# Patient Record
Sex: Male | Born: 1980 | Race: White | Hispanic: No | Smoking: Never smoker
Health system: Southern US, Community
[De-identification: ages and names within clinical notes are randomized; demographics above are authoritative.]

## PROBLEM LIST (undated history)

## (undated) DIAGNOSIS — G4730 Sleep apnea, unspecified: Secondary | ICD-10-CM

## (undated) DIAGNOSIS — F419 Anxiety disorder, unspecified: Secondary | ICD-10-CM

## (undated) DIAGNOSIS — F429 Obsessive-compulsive disorder, unspecified: Secondary | ICD-10-CM

## (undated) DIAGNOSIS — F329 Major depressive disorder, single episode, unspecified: Secondary | ICD-10-CM

## (undated) DIAGNOSIS — F32A Depression, unspecified: Secondary | ICD-10-CM

## (undated) HISTORY — PX: KNEE SURGERY: SHX244

## (undated) HISTORY — DX: Sleep apnea, unspecified: G47.30

## (undated) HISTORY — DX: Anxiety disorder, unspecified: F41.9

---
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
## 1998-11-20 ENCOUNTER — Ambulatory Visit (HOSPITAL_COMMUNITY): Admission: RE | Admit: 1998-11-20 | Discharge: 1998-11-20 | Payer: Self-pay | Admitting: Sports Medicine

## 1998-11-20 ENCOUNTER — Encounter: Payer: Self-pay | Admitting: Sports Medicine

## 1998-12-30 ENCOUNTER — Emergency Department (HOSPITAL_COMMUNITY): Admission: EM | Admit: 1998-12-30 | Discharge: 1998-12-30 | Payer: Self-pay | Admitting: Emergency Medicine

## 2011-07-28 ENCOUNTER — Ambulatory Visit (INDEPENDENT_AMBULATORY_CARE_PROVIDER_SITE_OTHER): Payer: BC Managed Care – PPO | Admitting: Physician Assistant

## 2011-07-28 VITALS — BP 146/81 | HR 96 | Temp 98.3°F | Resp 18 | Ht 68.0 in | Wt 270.0 lb

## 2011-07-28 DIAGNOSIS — Z111 Encounter for screening for respiratory tuberculosis: Secondary | ICD-10-CM

## 2011-07-28 NOTE — Progress Notes (Deleted)
  Subjective:    Patient ID: Todd Tucker, male    DOB: 1980-05-18, 31 y.o.   MRN: [id number]  HPI    Review of Systems     Objective:   Physical Exam        Assessment & Plan:

## 2011-07-28 NOTE — Progress Notes (Signed)
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  TB quesitonaire reviewed.  Ok to place PPD

## 2011-07-31 ENCOUNTER — Ambulatory Visit (INDEPENDENT_AMBULATORY_CARE_PROVIDER_SITE_OTHER): Payer: BC Managed Care – PPO

## 2011-07-31 DIAGNOSIS — Z111 Encounter for screening for respiratory tuberculosis: Secondary | ICD-10-CM

## 2011-07-31 LAB — TB SKIN TEST
Induration: 0 mm
TB Skin Test: NEGATIVE

## 2013-12-02 ENCOUNTER — Ambulatory Visit (HOSPITAL_COMMUNITY): Payer: Self-pay | Admitting: Psychiatry

## 2016-11-26 ENCOUNTER — Emergency Department (HOSPITAL_COMMUNITY)
Admission: EM | Admit: 2016-11-26 | Discharge: 2016-11-26 | Disposition: A | Discharge status: Home / Self Care | Payer: BC Managed Care – PPO | Attending: Emergency Medicine | Admitting: Emergency Medicine

## 2016-11-26 ENCOUNTER — Emergency Department (HOSPITAL_COMMUNITY): Payer: BC Managed Care – PPO

## 2016-11-26 ENCOUNTER — Encounter (HOSPITAL_COMMUNITY): Payer: Self-pay | Admitting: *Deleted

## 2016-11-26 DIAGNOSIS — Y929 Unspecified place or not applicable: Secondary | ICD-10-CM | POA: Insufficient documentation

## 2016-11-26 DIAGNOSIS — Y939 Activity, unspecified: Secondary | ICD-10-CM | POA: Diagnosis not present

## 2016-11-26 DIAGNOSIS — Y999 Unspecified external cause status: Secondary | ICD-10-CM | POA: Insufficient documentation

## 2016-11-26 DIAGNOSIS — S62306A Unspecified fracture of fifth metacarpal bone, right hand, initial encounter for closed fracture: Secondary | ICD-10-CM | POA: Insufficient documentation

## 2016-11-26 DIAGNOSIS — S62339A Displaced fracture of neck of unspecified metacarpal bone, initial encounter for closed fracture: Secondary | ICD-10-CM

## 2016-11-26 DIAGNOSIS — W2209XA Striking against other stationary object, initial encounter: Secondary | ICD-10-CM | POA: Insufficient documentation

## 2016-11-26 HISTORY — DX: Depression, unspecified: F32.A

## 2016-11-26 HISTORY — DX: Major depressive disorder, single episode, unspecified: F32.9

## 2016-11-26 HISTORY — DX: Obsessive-compulsive disorder, unspecified: F42.9

## 2016-11-26 NOTE — Discharge Instructions (Signed)
Please see Dr. Izora Ribas or another hand doctor within one week for follow-up and further evaluation and treatment. Keep your splint dry and keep it on until you see a hand doctor. Please return to emergency department if you develop any new or worsening symptoms including numbness of your fingers, if your cast gets wet, or any other concerning symptoms.

## 2016-11-26 NOTE — ED Provider Notes (Signed)
[facility]-EMERGENCY DEPT Provider Note   CSN: [id number] Arrival date & time: 11/26/16  1424     History   Chief Complaint Chief Complaint  Patient presents with  . Hand Pain    right    HPI Todd Tucker is a 36 y.o. male with history of bipolar 1 disorder, depression, OCD who presents with right hand pain after punching a dry erase board. 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 He had initial tingling, but that has resolved today. He has taken Aleve at home and used ice without relief. He has had associated swelling of his hand. He denies any other symptoms or injuries.  HPI  Past Medical History:  Diagnosis Date  . Bipolar 1 disorder (HCC)   . Depression   . OCD (obsessive compulsive disorder)     There are no active problems to display for this patient.   Past Surgical History:  Procedure Laterality Date  . KNEE SURGERY Bilateral        Home Medications    Prior to Admission medications   Not on File    Family History No family history on file.  Social History Social History  Substance Use Topics  . Smoking status: Never Smoker  . Smokeless tobacco: Never Used  . Alcohol use Yes     Comment: rarely     Allergies   Patient has no known allergies.   Review of Systems Review of Systems  Constitutional: Negative for chills and fever.  HENT: Negative for facial swelling and sore throat.   Respiratory: Negative for shortness of breath.   Cardiovascular: Negative for chest pain.  Gastrointestinal: Negative for abdominal pain, nausea and vomiting.  Genitourinary: Negative for dysuria.  Musculoskeletal: Positive for arthralgias, joint swelling and myalgias. Negative for back pain.  Skin: Negative for rash and wound.  Neurological: Negative for headaches.  Psychiatric/Behavioral: The patient is not nervous/anxious.       Physical Exam Updated Vital Signs BP (!) 145/98 (BP Location: Left Arm)   Pulse 88   Temp 97.8 F (36.6 C) (Oral)   Resp (!) 22   Ht 5' 6.5" (1.689 m)   Wt 122.5 kg (270 lb)   SpO2 96%   BMI 42.93 kg/m   Physical Exam  Constitutional: He appears well-developed and well-nourished. No distress.  HENT:  Head: Normocephalic and atraumatic.  Mouth/Throat: Oropharynx is clear and moist. No oropharyngeal exudate.  Eyes: Pupils are equal, round, and reactive to light. Conjunctivae are normal. Right eye exhibits no discharge. Left eye exhibits no discharge. No scleral icterus.  Neck: Normal range of motion. Neck supple. No thyromegaly present.  Cardiovascular: Normal rate, regular rhythm, normal heart sounds and intact distal pulses.  Exam reveals no gallop and no friction rub.   No murmur heard. Pulmonary/Chest: Effort normal and breath sounds normal. No stridor. No respiratory distress. He has no wheezes. He has no rales.  Abdominal: Soft. Bowel sounds are normal. He exhibits no distension. There is no tenderness. There is no rebound and no guarding.  Musculoskeletal: He exhibits no edema.  R hand: Swelling noted over the fourth and fifth metacarpals; tenderness associated signs; sensation intact; full range of motion with flexion and extension of fingers, as well as wrist; volar wrist tenderness; radial pulse intact; cap refill <2 secs  Lymphadenopathy:    He has  no cervical adenopathy.  Neurological: He is alert. Coordination normal.  Skin: Skin is warm and dry. No rash noted. He is not diaphoretic. No pallor.  Psychiatric: He has a normal mood and affect.  Nursing note and vitals reviewed.    ED Treatments / Results  Labs (all labs ordered are listed, but only abnormal results are displayed) Labs Reviewed - No data to display  EKG  EKG Interpretation None       Radiology Dg Wrist Complete Right  Result Date: 11/26/2016 CLINICAL DATA:  36 year old male with ulnar  sided hand pain in the region the fifth metacarpal after punching a wall in his apartment. EXAM: RIGHT WRIST - COMPLETE 3+ VIEW COMPARISON:  Concurrently obtained radiographs of the right hand FINDINGS: Positive for an acute fracture through the distal aspect of the fifth metacarpal. There is approximately 40 degrees of palmar tilt of the distal fracture fragment. Significant overlying soft tissue swelling. The carpus is intact incongruent. IMPRESSION: Positive for boxer's fracture of the distal aspect of the fifth metacarpal with approximately 40 degrees of palm are tilt of the distal fracture fragment. Electronically Signed   By: Malachy Moan M.D.   On: 11/26/2016 15:29   Dg Hand Complete Right  Result Date: 11/26/2016 CLINICAL DATA:  Post reduction EXAM: RIGHT HAND - COMPLETE 3+ VIEW COMPARISON:  None. FINDINGS: New overlying splint limits fine bony detail. A boxer's fracture of the fifth metacarpal is again noted with 53 degrees of volar angulation of the fifth metacarpal head as seen on the true lateral view. Previously, the 40 degrees of angulation was measured on the oblique projection. IMPRESSION: Boxer's fracture without significant change as seen through fiberglass. Electronically Signed   By: Tollie Eth M.D.   On: 11/26/2016 17:43   Dg Hand Complete Right  Result Date: 11/26/2016 CLINICAL DATA:  36 year old male with right hand pain after punching the wall EXAM: RIGHT HAND - COMPLETE 3+ VIEW COMPARISON:  Concurrently obtained radiographs of the right wrist FINDINGS: Acute fracture through the distal aspect of the fifth metacarpal. Approximately 40 degrees of palmar tilt of the distal fracture fragment. The remaining bones and joints are intact and unremarkable. There is overlying soft tissue swelling. IMPRESSION: Positive for boxer's fracture of the distal fifth metacarpal with approximately 40 degrees palmar tilt of the distal fracture fragment. Electronically Signed   By: Malachy Moan  M.D.   On: 11/26/2016 15:30    Procedures Procedures (including critical care time)  SPLINT APPLICATION Date/Time: 6:24 PM Authorized by: Emi Holes Consent: Verbal consent obtained. Risks and benefits: risks, benefits and alternatives were discussed Consent given by: patient Splint applied by: orthopedic technician Location details: R hand Splint type: ulnar gutter Supplies used: orthoglass, ACE wrap Post-procedure: The splinted body part was neurovascularly unchanged following the procedure. Patient tolerance: Patient tolerated the procedure well with no immediate complications.   Medications Ordered in ED Medications - No data to display   Initial Impression / Assessment and Plan / ED Course  I have reviewed the triage vital signs and the nursing notes.  Pertinent labs & imaging results that were available during my care of the patient were reviewed by me and considered in my medical decision making (see chart for details).     Patient with boxer's fracture of fifth metacarpal of the right hand. I spoke with Dr. Izora Ribas who advised ulnar gutter and we could attempt reduction. Initially 40 angulation was measured obliquely, reduction without anesthetic with seemingly good dorsal movement  was attempted, however post reduction film shows no improvement and 53 angulation after splinting. Patient does report he was pushing on it more after I left the room prior to splinting. Will have patient follow up with hand surgery. Splint care discussed, as well as elevation. Patient given arm sling. Patient understands and agrees with plan. Patient vitals stable throughout ED course and discharged in satisfactory condition. I also discussed patient case with Dr. Silverio Lay who guided the patient's management and agrees with plan.   Final Clinical Impressions(s) / ED Diagnoses   Final diagnoses:  Closed boxer's fracture, initial encounter    New Prescriptions There are no discharge  medications for this patient.    Emi Holes, PA-C 11/26/16 1824    Charlynne Pander, MD 11/26/16 [phone]

## 2016-11-26 NOTE — ED Triage Notes (Signed)
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  Out of anger, pt punched a dry erase board with right hand.  Pt noticed swelling and thinks his hand is broken.

## 2017-09-20 ENCOUNTER — Encounter (INDEPENDENT_AMBULATORY_CARE_PROVIDER_SITE_OTHER): Payer: Self-pay | Admitting: Ophthalmology

## 2017-09-20 ENCOUNTER — Ambulatory Visit (INDEPENDENT_AMBULATORY_CARE_PROVIDER_SITE_OTHER): Payer: BC Managed Care – PPO | Admitting: Ophthalmology

## 2017-09-20 DIAGNOSIS — H35033 Hypertensive retinopathy, bilateral: Secondary | ICD-10-CM | POA: Diagnosis not present

## 2017-09-20 DIAGNOSIS — H3581 Retinal edema: Secondary | ICD-10-CM | POA: Diagnosis not present

## 2017-09-20 DIAGNOSIS — H539 Unspecified visual disturbance: Secondary | ICD-10-CM

## 2017-09-20 NOTE — Progress Notes (Signed)
[facility] - Clinic Note  09/20/2017     CHIEF COMPLAINT Patient presents for Retina Evaluation   HISTORY OF PRESENT ILLNESS: Todd Tucker is a 37 y.o. male who presents to the clinic today for:   HPI    Retina Evaluation    In left eye.  This started 2 days ago.  Duration of 2 days.  Associated Symptoms Blind Spot and Pain.  Context:  distance vision, mid-range vision and near vision.  Treatments tried include no treatments.  I, the attending physician,  performed the HPI with the patient and updated documentation appropriately.          Comments    37 y/o male pt referred by Dr. Laruth Tucker for concern of unexplained vision loss OS.  Saw Dr. Dione Tucker earlier this morning.  Pt states he began noticing his vision OS gradually deteriorating about 3 years ago, but over the past several weeks, he has had intermittent episodes where his vision OS will go "completely black" for a few seconds, accompanied with major sharp pain in the corners of his left eye.  Symptoms only last for a few seconds, and vision comes back.  This last occurred two days ago.  Nothing in particular seems to trigger symptoms.  Not experiencing any pain currently, but vision OS is very blurred.  Dr. Dione Tucker found nothing abnormal in the patient's left eye.  Denies flashes, floaters.  No gtts.        Last edited by Todd Chris, MD on 09/20/2017 10:39 AM. (History)    Pt states he saw Todd Tucker yesterday due to having pain and gradual decrease VA OS; Pt states he was sent to Dr. Dione Tucker today; Pt states 2 years ago he began to notice a decrease in OS VA; Pt states he take medications for psychiatric issues, states he is treated for bipolar disorder and OCD; Pt states he takes anti seizure medications; Pt states he had to pull over on the way home from work due to stabbing pain OS; Pt states he has had many episodes of the pain and "vision black out" that "last a few minutes"; Pt states episodes have happened twice  in the past week, states OS VA is really dark and then when [place] "comes back its really blurred"; Pt states episodes happen randomly, do not have any pattern; Pt states he was prescribed specs x 5 years ago; Pt states 3 years ago he began having pain and thought it was migraines; Pt states he has been on current medications for years, denies any changes in medication doses or brands; Pt denies taking any blood pressure medications, states that yesterday blood pressure was 126/80;   Referring physician: Olivia Canter, MD [address] [place], [place] [address]  HISTORICAL INFORMATION:   Selected notes from the MEDICAL RECORD NUMBER Referred by Dr. Laruth Tucker for concern of unexplained VA loss OS; LEE-  Ocular Hx-  PMH-     CURRENT MEDICATIONS: No current outpatient medications on file. (Ophthalmic Drugs)   No current facility-administered medications for this visit.  (Ophthalmic Drugs)   Current Outpatient Medications (Other)  Medication Sig  . ALPRAZolam (XANAX) 1 MG tablet   . lamoTRIgine (LAMICTAL) 200 MG tablet Take by mouth.  . OXcarbazepine (TRILEPTAL) 150 MG tablet   . sertraline (ZOLOFT) 100 MG tablet    No current facility-administered medications for this visit.  (Other)      REVIEW OF SYSTEMS: ROS  Positive for: Eyes, Psychiatric   Negative for: Constitutional, Gastrointestinal, Neurological, Skin, Genitourinary, Musculoskeletal, HENT, Endocrine, Cardiovascular, Respiratory, Allergic/Imm, Heme/Lymph   Last edited by Celine Mans, COA on 09/20/2017 10:18 AM. (History)       ALLERGIES No Known Allergies  PAST MEDICAL HISTORY Past Medical History:  Diagnosis Date  . Bipolar 1 disorder (HCC)   . Depression   . OCD (obsessive compulsive disorder)    Past Surgical History:  Procedure Laterality Date  . KNEE SURGERY Bilateral     FAMILY HISTORY History reviewed. No pertinent family history.  SOCIAL HISTORY Social History   Tobacco Use  . Smoking  status: Never Smoker  . Smokeless tobacco: Never Used  Substance Use Topics  . Alcohol use: Yes    Comment: rarely  . Drug use: No         OPHTHALMIC EXAM:  Base Eye Exam    Visual Acuity (Snellen - Linear)      Right Left   Dist [place] 20/25 -2 20/150 -2   Dist ph [place] 20/20 20/100 -2       Tonometry (Tonopen, 10:16 AM)      Right Left   Pressure 16 15  Squeezing       Pupils      Dark Light Shape React APD   Right 6 6 Round No None   Left 6 6 Round No None  Pharm dilated OU       Visual Fields (Counting fingers)      Left Right    Full Full  Pt reports that though he could CF OS, the fingers were blurred and doubled.       Extraocular Movement      Right Left    Full, Ortho Full, Ortho       Neuro/Psych    Oriented x3:  Yes   Mood/Affect:  Normal       Dilation    Both eyes:  1.0% Mydriacyl, 2.5% Phenylephrine @ 10:16 AM        Slit Lamp and Fundus Exam    Slit Lamp Exam      Right Left   Lids/Lashes Dermatochalasis - upper lid, Meibomian gland dysfunction Dermatochalasis - upper lid, Meibomian gland dysfunction   Conjunctiva/Sclera White and quiet White and quiet   Cornea Clear Inferior-nasal focal area of PEE    Anterior Chamber Deep and quiet, no cell or flare Deep and quiet, no cell or flare   Iris Round and dilated Round and dilated   Lens Clear Clear   Vitreous Mild Vitreous syneresis Mild Vitreous syneresis       Fundus Exam      Right Left   Disc Pink and Sharp Pink and Sharp   C/D Ratio 0.1 0.2   Macula Good foveal reflex, Retinal pigment epithelial mottling, No heme or edema Good foveal reflex, Retinal pigment epithelial mottling, No heme or edema   Vessels Mild Vascular attenuation, Copper wiring Copper wiring, AV crossing changes, Vascular attenuation   Periphery Attached Attached        Refraction    Manifest Refraction      Sphere Cylinder Axis Dist VA   Right +0.25 +0.50 080 20/20-2   Left [place] +1.50 090 20/150           IMAGING AND PROCEDURES  Imaging and Procedures for @TODAY @  OCT, Retina - OU - Both Eyes       Right Eye Quality was good. Central Foveal Thickness: 287. Progression  has no prior data. Findings include normal foveal contour, no IRF, no SRF.   Left Eye Quality was good. Central Foveal Thickness: 281. Progression has no prior data. Findings include normal foveal contour, no IRF, no SRF.   Notes *Images captured and stored on drive  Diagnosis / Impression:  NFP, No IRF/SRF  Clinical management:  See below  Abbreviations: NFP - Normal foveal profile. CME - cystoid macular edema. PED - pigment epithelial detachment. IRF - intraretinal fluid. SRF - subretinal fluid. EZ - ellipsoid zone. ERM - epiretinal membrane. ORA - outer retinal atrophy. ORT - outer retinal tubulation. SRHM - subretinal hyper-reflective material         Fluorescein Angiography Optos (Transit OS)       Right Eye Progression has no prior data. Early phase findings include normal observations. Mid/Late phase findings include normal observations.   Left Eye Progression has no prior data. Early phase findings include normal observations. Mid/Late phase findings include normal observations.   Notes *Images captured and stored on drive;   Impression: Normal study OU - no vascular perfusion defects or leakage OU                  ASSESSMENT/PLAN:    ICD-10-CM   1. Visual disturbance of one eye H53.9   2. Retinal edema H35.81 OCT, Retina - OU - Both Eyes    Fluorescein Angiography Optos (Transit OS)  3. Hypertensive retinopathy of both eyes H35.033 Fluorescein Angiography Optos (Transit OS)    1. Decreased vision OS - pt reports decreased vision started 2-3 years ago and has gradually worsened - pt presented to Dr. Conley RollsLe 2 days ago due to acute episode of vision loss associated with pain OS - pt reports 2 episodes in the last week - dilated eye exam completely normal - pt denies history of  amblyopia or any significant prior ocular history - was corrected to 20/50 by Dr. Dione BoozeGroat - here, OCT and FA completely normal -- no retinal or ocular pathology noted to explain symptoms - differential includes undiagnosed amblyopia, ocular migraine variant, vs other - no intervention or treatment indicated at this time - recommend monitoring for now - f/u in 4 wks, sooner prn  2. No retinal edema on exam or OCT  3. Hypertensive retinopathy OU - BP normal -- per pt report last reading was 120s / 80s - discussed importance of tight BP control - monitor   Ophthalmic Meds Ordered this visit:  No orders of the defined types were placed in this encounter.      Return in about 1 month (around 10/21/2017) for F/U decreased VA OS, DFE, OCT.  There are no Patient Instructions on file for this visit.   Explained the diagnoses, plan, and follow up with the patient and they expressed understanding.  Patient expressed understanding of the importance of proper follow up care.   This document serves as a record of services personally performed by Karie ChimeraBrian G. Teralyn Mullins, MD, PhD. It was created on their behalf by Virgilio BellingMeredith Fabian, COA, a certified ophthalmic assistant. The creation of this record is the provider's dictation and/or activities during the visit.  Electronically signed by: Virgilio BellingMeredith Fabian, COA  07.31.19 1:06 PM    Karie ChimeraBrian G. Majed Pellegrin, M.D., Ph.D. Diseases & Surgery of the Retina and Vitreous [facility]   I have reviewed the above documentation for accuracy and completeness, and I agree with the above. Karie ChimeraBrian G. Ahlijah Raia, M.D., Ph.D. 09/20/17 1:06 PM  Abbreviations: M myopia (nearsighted); A astigmatism; H hyperopia (farsighted); P presbyopia; Mrx spectacle prescription;  CTL contact lenses; OD right eye; OS left eye; OU both eyes  XT exotropia; ET esotropia; PEK punctate epithelial keratitis; PEE punctate epithelial erosions; DES dry eye syndrome; MGD meibomian  gland dysfunction; ATs artificial tears; PFAT's preservative free artificial tears; [place] nuclear sclerotic cataract; PSC posterior subcapsular cataract; ERM epi-retinal membrane; PVD posterior vitreous detachment; RD retinal detachment; DM diabetes mellitus; DR diabetic retinopathy; NPDR non-proliferative diabetic retinopathy; PDR proliferative diabetic retinopathy; CSME clinically significant macular edema; DME diabetic macular edema; dbh dot blot hemorrhages; CWS cotton wool spot; POAG primary open angle glaucoma; C/D cup-to-disc ratio; HVF humphrey visual field; GVF goldmann visual field; OCT optical coherence tomography; IOP intraocular pressure; BRVO Branch retinal vein occlusion; CRVO central retinal vein occlusion; CRAO central retinal artery occlusion; BRAO branch retinal artery occlusion; RT retinal tear; SB scleral buckle; PPV pars plana vitrectomy; VH Vitreous hemorrhage; PRP panretinal laser photocoagulation; IVK intravitreal kenalog; VMT vitreomacular traction; MH Macular hole;  NVD neovascularization of the disc; NVE neovascularization elsewhere; AREDS age related eye disease study; ARMD age related macular degeneration; POAG primary open angle glaucoma; EBMD epithelial/anterior basement membrane dystrophy; ACIOL anterior chamber intraocular lens; IOL intraocular lens; PCIOL posterior chamber intraocular lens; Phaco/IOL phacoemulsification with intraocular lens placement; [place] photorefractive keratectomy; LASIK laser assisted in situ keratomileusis; HTN hypertension; DM diabetes mellitus; COPD chronic obstructive pulmonary disease

## 2017-10-24 ENCOUNTER — Encounter (INDEPENDENT_AMBULATORY_CARE_PROVIDER_SITE_OTHER): Payer: BC Managed Care – PPO | Admitting: Ophthalmology

## 2017-11-04 DIAGNOSIS — F3181 Bipolar II disorder: Secondary | ICD-10-CM | POA: Insufficient documentation

## 2017-11-04 DIAGNOSIS — F4310 Post-traumatic stress disorder, unspecified: Secondary | ICD-10-CM

## 2017-11-21 ENCOUNTER — Ambulatory Visit: Payer: BC Managed Care – PPO | Admitting: Psychiatry

## 2017-11-24 ENCOUNTER — Telehealth: Payer: Self-pay | Admitting: Psychiatry

## 2017-11-24 MED ORDER — SERTRALINE HCL 100 MG PO TABS: 150.0000 mg | ORAL_TABLET | Freq: Every day | ORAL | 0 refills | Status: DC

## 2017-11-24 MED ORDER — LAMOTRIGINE 200 MG PO TABS: 200.0000 mg | ORAL_TABLET | Freq: Every day | ORAL | 0 refills | Status: DC

## 2017-11-24 NOTE — Telephone Encounter (Signed)
Will get chart for provider asap

## 2017-11-24 NOTE — Telephone Encounter (Signed)
Pt's wife came in to pay bill and disclosed that Todd Tucker had missed his 10/1 appt. He is not doing well at all. Sits at home and is having suicidal feelings. She is afraid to leave him alone. Made an appt that she will bring him to next 10/18 but needs help in the meantime.

## 2017-11-24 NOTE — Telephone Encounter (Signed)
Called wife who reports that he has "had a really rough month." She reports that he often does not verbalize s/s. She is concerned that he is feeling hopeless. She reports that he has reported difficulty with increased dose of Trileptal and reports that he would like to decrease the dose. She reports that he has been delving into his work.She reports that he has been having suicidal ideation that he reports that it is constant. Reports that he is thinking about death related thoughts. Reports that he has been questioning his purpose and what he is living for.  She reports that he has not verbalized suicidal intent and reports that he is "disturbed" by suicidal thoughts. Discussed that he has verbalized intrusive thoughts of self-harm and suicide in the past and that he has had the thoughts when it is not congruent with how he feels or thinks, and wife reports "that's exactly it."   She reports that he has been having increased headaches.   Plan: Discussed decreasing Trileptal to 450 mg po QHS for at least 5 days and may continue to decrease by 150 mg every 5 days until s/s improve. Sertraline and Trileptal refilled. Offered to work pt in before scheduled apt on 12/08/17, however wife reports that he should be ok to wait until scheduled apt. Encouraged wife to contact office if this changes, he has worsening s/s, or if he experiences suicidal intent and/or plan. Advised her to take him to [facility] if he experiences suicidal intent and/or plan and there are safety concerns, and wife agrees to do so.

## 2017-12-08 ENCOUNTER — Ambulatory Visit (INDEPENDENT_AMBULATORY_CARE_PROVIDER_SITE_OTHER): Payer: BC Managed Care – PPO | Admitting: Psychiatry

## 2017-12-08 ENCOUNTER — Encounter: Payer: Self-pay | Admitting: Psychiatry

## 2017-12-08 DIAGNOSIS — F422 Mixed obsessional thoughts and acts: Secondary | ICD-10-CM | POA: Diagnosis not present

## 2017-12-08 DIAGNOSIS — F4310 Post-traumatic stress disorder, unspecified: Secondary | ICD-10-CM

## 2017-12-08 MED ORDER — LAMOTRIGINE 200 MG PO TABS: 200.0000 mg | ORAL_TABLET | Freq: Every day | ORAL | 0 refills | Status: DC

## 2017-12-08 MED ORDER — SERTRALINE HCL 100 MG PO TABS: 200.0000 mg | ORAL_TABLET | Freq: Every day | ORAL | 0 refills | Status: DC

## 2017-12-08 MED ORDER — ALPRAZOLAM 1 MG PO TABS: 1.0000 mg | ORAL_TABLET | Freq: Two times a day (BID) | ORAL | 1 refills | Status: DC | PRN

## 2017-12-08 NOTE — Patient Instructions (Signed)
Take Trileptal 150 mg at bedtime for 5 days, then stop.  Increase Sertraline to 200 mg daily.   Continue Lamotrigine.

## 2017-12-08 NOTE — Progress Notes (Signed)
Janes Colegrove [id number] 09/17/1980 37 y.o.  Subjective:   Patient ID:  Todd Tucker is a 37 y.o. (DOB Jun 21, 1980) male.  Chief Complaint:  Chief Complaint  Patient presents with  . Anxiety  . Panic Attack  . Depression  . Sleeping Problem    HPI Todd Tucker presents to the office today for follow-up of anxiety and mood disturbance. He is accompanied by his wife. He reports that he has been having significant difficulty with anxiety and "depressive thoughts." He reports "I think about death all the time" and also has constant catastrophic thoughts. "I feel like I am haunted" by negative thoughts and does not want to act on these thoughts. Reports that he tries to stay busy as much as possible to try to avoid thoughts. Reports that these intrusive thoughts are worse compared to the past. Denies intrusive memories. They report that he has had a few panic attacks in the last few months. Reports that he is "meticulous" at work and has to check his work multiple times. Will throw away planners and papers if he wrote something and "it didn't look right." Wife reports that he is very meticulous about his clothes and will have to change several times because he does not thing things look or feel right and it will cause them to be late. Denies obsessive counting. Wife reports that he has to set volume on an even number. Denies rituals or routines. Wife reports that he has bought 6 pairs of shoes in the last 2 months and will not wear shoes again if they get scuffed. They report that he will often seek reassurance from wife repeatedly. Will typically take Xanax only at night due to drowsiness. Wife reports that he "always has a schedule in his mind" and that things have to follow that schedule.   Mood has been significantly depressed. They both report that his mood is good while doing activities related to football and as soon as he leaves a football related activity, his mood will drop. Wife reports that he  will hyper-focus on football and will prepare excessively. Wife reports some irritability.   He denies difficulty falling or staying asleep. Denies nightmares. Wife reports that he wakes up gasping for air and frequently snores. Denies change in appetite. Energy has been "good." Motivation has also been good. Denies impaired concentration. "Sometimes I just want to escape from everything." Denies SI.   He will work excessively on football. Denies any periods of decreased need for sleep. They deny excessive spending other than clothes and shoes. They report that he has periods of 1-2 hours where he is more talkative and happy for no reason. Denies elevated self-confidence. Denies risky or impulsive behavior. Denies racing thoughts that are not anxious in content.   Wife reports that he has played full contact football from 37 years old through college. 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 He has estimated over 100 head injuries. She reports that he has some memory issues and chronic headaches.   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   Medications: I have reviewed the patient's current medications.  Current Outpatient Medications  Medication Sig Dispense Refill  . ALPRAZolam (XANAX) 1 MG tablet Take 1 tablet (1 mg total) by mouth 2 (two) times daily as needed. 60 tablet 1  . lamoTRIgine (LAMICTAL) 200 MG tablet Take 1 tablet (200 mg total) by mouth daily. 90 tablet 0  . Multiple Vitamins-Minerals (MULTIVITAMIN WITH MINERALS) tablet Take 1  tablet by mouth daily.    . Oxcarbazepine (TRILEPTAL) 300 MG tablet Take 300 mg by mouth at bedtime.     . sertraline (ZOLOFT) 100 MG tablet Take 2 tablets (200 mg total) by mouth daily. 180 tablet 0   No current facility-administered medications for this visit.     Medication Side Effects: None  Allergies: No Known Allergies  Past Medical History:  Diagnosis Date  . Depression   . OCD (obsessive compulsive  disorder)     Family History  Problem Relation Age of Onset  . Anxiety disorder Mother   . CVA Father   . ADD / ADHD Sister     Social History   Socioeconomic History  . Marital status: Married    Spouse name: Not on file  . Number of children: Not on file  . Years of education: Not on file  . Highest education level: Not on file  Occupational History  . Not on file  Social Needs  . Financial resource strain: Not on file  . Food insecurity:    Worry: Not on file    Inability: Not on file  . Transportation needs:    Medical: Not on file    Non-medical: Not on file  Tobacco Use  . Smoking status: Never Smoker  . Smokeless tobacco: Never Used  Substance and Sexual Activity  . Alcohol use: Yes    Comment: rarely  . Drug use: No  . Sexual activity: Not on file  Lifestyle  . Physical activity:    Days per week: Not on file    Minutes per session: Not on file  . Stress: Not on file  Relationships  . Social connections:    Talks on phone: Not on file    Gets together: Not on file    Attends religious service: Not on file    Active member of club or organization: Not on file    Attends meetings of clubs or organizations: Not on file    Relationship status: Not on file  . Intimate partner violence:    Fear of current or ex partner: Not on file    Emotionally abused: Not on file    Physically abused: Not on file    Forced sexual activity: Not on file  Other Topics Concern  . Not on file  Social History Narrative  . Not on file    Past Medical History, Surgical history, Social history, and Family history were reviewed and updated as appropriate.   Please see review of systems for further details on the patient's review from today.   Review of Systems:  Review of Systems  Respiratory: Positive for apnea.        Wife reports that he will snore and gasp for air in his sleep.  Neurological: Positive for headaches.    Objective:   Physical Exam:  There were no  vitals taken for this visit.  Physical Exam  Lab Review:  No results found for: NA, K, CL, CO2, GLUCOSE, BUN, CREATININE, CALCIUM, PROT, ALBUMIN, AST, ALT, ALKPHOS, BILITOT, GFRNONAA, GFRAA  No results found for: WBC, RBC, HGB, HCT, PLT, MCV, MCH, MCHC, RDW, LYMPHSABS, MONOABS, EOSABS, BASOSABS  No results found for: POCLITH, LITHIUM   No results found for: PHENYTOIN, PHENOBARB, VALPROATE, CBMZ   .res Assessment: Plan:   Patient seen for 25 minutes and greater than 50% of visit spent counseling patient and his wife regarding anxiety, and specifically obsessive and intrusive thoughts.  Administered for the obsessive-compulsive  screening tool and patient indicated multiple intrusive and compulsive thoughts and compulsions that caused significant distress.  Discussed increasing sertraline to 200 mg daily to improve obsessive thoughts.  Discussed decreasing Trileptal 250 mg at bedtime for 5 days and then discontinuing due to no effect and possible side effects.  Discussed that reported signs and symptoms, to include snoring, episodes of not breathing, and episodes of gasping during sleep, and excessive daytime somnolence  indicate possible sleep apnea. Recommend home sleep study. Pt agrees and was given home sleep study to use and return to clinic after home sleep study is completed. Pt to f/u in 4-6 weeks or sooner if clinically indicated.   Mixed obsessional thoughts and acts - Plan: sertraline (ZOLOFT) 100 MG tablet, lamoTRIgine (LAMICTAL) 200 MG tablet, ALPRAZolam (XANAX) 1 MG tablet  PTSD (post-traumatic stress disorder) - Plan: sertraline (ZOLOFT) 100 MG tablet, ALPRAZolam (XANAX) 1 MG tablet  Please see After Visit Summary for patient specific instructions.  No future appointments.  No orders of the defined types were placed in this encounter.     -------------------------------

## 2018-01-05 ENCOUNTER — Telehealth: Payer: Self-pay

## 2018-01-05 ENCOUNTER — Telehealth: Payer: Self-pay | Admitting: Psychiatry

## 2018-01-05 NOTE — Telephone Encounter (Signed)
Wife called wanting results of sleep study. She also wanted to know status of appeal of the denial for the study. She also needed clarification for directions to the zoloft.  The pharmacy filled the rx with the old directions of 100mg  1 1/2 q day.  She thought it was to be increased to 200mg .  Please call to discuss both.  [phone]

## 2018-01-05 NOTE — Telephone Encounter (Signed)
Called [employer] to discuss an appeal for the pt's sleep study. They recommend sending records/notes justifying the reason needed for sleep study for review. Fax all notes/records to Provider Appeal Review at [phone]. It will take 5-7 days for a decision.   Second option is for provider to [facility] directly [facility] at [phone] ext [phone].    Will fax records as soon as possible.

## 2018-01-12 NOTE — Telephone Encounter (Signed)
Left message for wife that sleep study results were received and indicated sleep apnea and that order would be send for cPap. Also included in message that appeal was sent to insurance re: denial of sleep study and are awaiting review. Instructed to call office with any further questions.

## 2018-02-02 ENCOUNTER — Ambulatory Visit: Payer: BC Managed Care – PPO | Admitting: Psychiatry

## 2018-03-19 ENCOUNTER — Telehealth: Payer: Self-pay | Admitting: Psychiatry

## 2018-03-19 DIAGNOSIS — R519 Headache, unspecified: Secondary | ICD-10-CM

## 2018-03-19 DIAGNOSIS — R51 Headache: Principal | ICD-10-CM

## 2018-03-19 NOTE — Telephone Encounter (Signed)
[place] wanted to know if you can send over a referral for Todd Tucker to [facility] Dr Daisy Blossom. He is still having severe headaches fax# [phone]

## 2018-03-20 NOTE — Telephone Encounter (Signed)
Wife, [place] called back and states he has nightly headaches, which can get pretty intense for approx. 10-15 mins intervals. Mostly the right temporal area and causes blurry vision and sometimes he physically gets sick to his stomach. She said headache just lingers and he's not been able to sleep well, he doesn't stay asleep, not sure if all related to headaches and/or sleep apnea.  She appreciates referral.

## 2018-03-20 NOTE — Telephone Encounter (Signed)
Left voice mail to call back 

## 2018-03-21 NOTE — Telephone Encounter (Signed)
Wife requesting neuro referral for chronic HA's. Will request neuro referral due to h/o multiple head injuries and chronic headaches.

## 2018-03-21 NOTE — Addendum Note (Signed)
Addended by: Derenda Mis on: 03/21/2018 08:31 AM   Modules accepted: Orders

## 2018-03-30 ENCOUNTER — Ambulatory Visit: Payer: BC Managed Care – PPO | Admitting: Psychiatry

## 2018-03-31 ENCOUNTER — Other Ambulatory Visit: Payer: Self-pay

## 2018-03-31 ENCOUNTER — Encounter (HOSPITAL_COMMUNITY): Payer: Self-pay | Admitting: Emergency Medicine

## 2018-03-31 ENCOUNTER — Emergency Department (HOSPITAL_COMMUNITY): Payer: BC Managed Care – PPO

## 2018-03-31 ENCOUNTER — Emergency Department (HOSPITAL_COMMUNITY)
Admission: EM | Admit: 2018-03-31 | Discharge: 2018-04-01 | Disposition: A | Discharge status: Home / Self Care | Payer: BC Managed Care – PPO | Attending: Emergency Medicine | Admitting: Emergency Medicine

## 2018-03-31 DIAGNOSIS — Z79899 Other long term (current) drug therapy: Secondary | ICD-10-CM | POA: Diagnosis not present

## 2018-03-31 DIAGNOSIS — R42 Dizziness and giddiness: Secondary | ICD-10-CM

## 2018-03-31 DIAGNOSIS — R1310 Dysphagia, unspecified: Secondary | ICD-10-CM | POA: Diagnosis not present

## 2018-03-31 DIAGNOSIS — H538 Other visual disturbances: Secondary | ICD-10-CM | POA: Diagnosis not present

## 2018-03-31 DIAGNOSIS — R110 Nausea: Secondary | ICD-10-CM | POA: Insufficient documentation

## 2018-03-31 DIAGNOSIS — R51 Headache: Secondary | ICD-10-CM | POA: Insufficient documentation

## 2018-03-31 DIAGNOSIS — R519 Headache, unspecified: Secondary | ICD-10-CM

## 2018-03-31 DIAGNOSIS — Z823 Family history of stroke: Secondary | ICD-10-CM | POA: Insufficient documentation

## 2018-03-31 LAB — COMPREHENSIVE METABOLIC PANEL
ALT: 15 U/L (ref 0–44)
AST: 21 U/L (ref 15–41)
Albumin: 3.8 g/dL (ref 3.5–5.0)
Alkaline Phosphatase: 76 U/L (ref 38–126)
Anion gap: 12 (ref 5–15)
BUN: 15 mg/dL (ref 6–20)
CO2: 23 mmol/L (ref 22–32)
Calcium: 8.7 mg/dL — ABNORMAL LOW (ref 8.9–10.3)
Chloride: 104 mmol/L (ref 98–111)
Creatinine, Ser: 1.03 mg/dL (ref 0.61–1.24)
GFR calc Af Amer: >60 mL/min (ref >60)
GFR calc non Af Amer: >60 mL/min (ref >60)
Glucose, Bld: 120 mg/dL — ABNORMAL HIGH (ref 70–99)
Potassium: 3.7 mmol/L (ref 3.5–5.1)
Sodium: 139 mmol/L (ref 135–145)
Total Bilirubin: 0.8 mg/dL (ref 0.3–1.2)
Total Protein: 6.8 g/dL (ref 6.5–8.1)

## 2018-03-31 LAB — DIFFERENTIAL
Abs Immature Granulocytes: 0.02 10*3/uL (ref 0.00–0.07)
Basophils Absolute: 0 10*3/uL (ref 0.0–0.1)
Basophils Relative: 1 %
Eosinophils Absolute: 0.2 10*3/uL (ref 0.0–0.5)
Eosinophils Relative: 2 %
Immature Granulocytes: 0 %
Lymphocytes Relative: 30 %
Lymphs Abs: 2 10*3/uL (ref 0.7–4.0)
Monocytes Absolute: 0.4 10*3/uL (ref 0.1–1.0)
Monocytes Relative: 6 %
Neutro Abs: 3.9 10*3/uL (ref 1.7–7.7)
Neutrophils Relative %: 61 %

## 2018-03-31 LAB — URINALYSIS, ROUTINE W REFLEX MICROSCOPIC
Bilirubin Urine: NEGATIVE
Glucose, UA: NEGATIVE mg/dL
Hgb urine dipstick: NEGATIVE
Ketones, ur: NEGATIVE mg/dL
Leukocytes, UA: NEGATIVE
Nitrite: NEGATIVE
Protein, ur: NEGATIVE mg/dL
Specific Gravity, Urine: 1.023 (ref 1.005–1.030)
pH: 6 (ref 5.0–8.0)

## 2018-03-31 LAB — RAPID URINE DRUG SCREEN, HOSP PERFORMED
Amphetamines: NOT DETECTED
Barbiturates: NOT DETECTED
Benzodiazepines: POSITIVE — AB
Cocaine: NOT DETECTED
Opiates: NOT DETECTED
Tetrahydrocannabinol: NOT DETECTED

## 2018-03-31 LAB — CBC
HCT: 45.5 % (ref 39.0–52.0)
Hemoglobin: 14.9 g/dL (ref 13.0–17.0)
MCH: 30.3 pg (ref 26.0–34.0)
MCHC: 32.7 g/dL (ref 30.0–36.0)
MCV: 92.5 fL (ref 80.0–100.0)
Platelets: 226 10*3/uL (ref 150–400)
RBC: 4.92 MIL/uL (ref 4.22–5.81)
RDW: 12.9 % (ref 11.5–15.5)
WBC: 6.5 10*3/uL (ref 4.0–10.5)
nRBC: 0 % (ref 0.0–0.2)

## 2018-03-31 LAB — ETHANOL: Alcohol, Ethyl (B): <10 mg/dL (ref <10)

## 2018-03-31 LAB — PROTIME-INR
INR: 1.03
Prothrombin Time: 13.4 seconds (ref 11.4–15.2)

## 2018-03-31 LAB — APTT: aPTT: 32 seconds (ref 24–36)

## 2018-03-31 MED ORDER — DIPHENHYDRAMINE HCL 50 MG/ML IJ SOLN
25.0000 mg | Freq: Once | INTRAMUSCULAR | Status: AC
  Administered 2018-04-01: 25 mg via INTRAVENOUS
  Filled 2018-03-31: qty 1

## 2018-03-31 MED ORDER — SODIUM CHLORIDE 0.9 % IV BOLUS
1000.0000 mL | Freq: Once | INTRAVENOUS | Status: AC
  Administered 2018-04-01: 1000 mL via INTRAVENOUS

## 2018-03-31 MED ORDER — MECLIZINE HCL 25 MG PO TABS
25.0000 mg | ORAL_TABLET | Freq: Once | ORAL | Status: AC
  Administered 2018-03-31: 25 mg via ORAL
  Filled 2018-03-31: qty 1

## 2018-03-31 MED ORDER — DEXAMETHASONE SODIUM PHOSPHATE 10 MG/ML IJ SOLN
10.0000 mg | Freq: Once | INTRAMUSCULAR | Status: AC
  Administered 2018-04-01: 10 mg via INTRAVENOUS
  Filled 2018-03-31: qty 1

## 2018-03-31 MED ORDER — PROCHLORPERAZINE EDISYLATE 10 MG/2ML IJ SOLN
10.0000 mg | Freq: Once | INTRAMUSCULAR | Status: AC
  Administered 2018-04-01: 10 mg via INTRAVENOUS
  Filled 2018-03-31: qty 2

## 2018-03-31 MED ORDER — KETOROLAC TROMETHAMINE 30 MG/ML IJ SOLN
30.0000 mg | Freq: Once | INTRAMUSCULAR | Status: AC
  Administered 2018-04-01: 30 mg via INTRAVENOUS
  Filled 2018-03-31: qty 1

## 2018-03-31 NOTE — ED Triage Notes (Signed)
Pt has had ongoing issues with dizziness, headache, and "trouble getting words out" for at least the past week. Pt states that after dinner tonight he was so dizzy he decided it was time to be evaluated.  Has hx of stroke in the family.

## 2018-03-31 NOTE — ED Provider Notes (Signed)
[facility] EMERGENCY DEPARTMENT Provider Note   CSN: [id number] Arrival date & time: 03/31/18  2046     History   Chief Complaint Chief Complaint  Patient presents with  . Dizziness    HPI Todd Tucker is a 38 y.o. male with history of depression, OCD, PTSD presents for evaluation of acute onset, persistent dizziness for 1 week.  Reports intermittent blurred vision, sharp right-sided frontal headaches, dysarthria and dysphagia.  However he does report the headaches have been going on for several months and dysphagia has been going on for the better part of a year.  Denies numbness or tingling, chest pain, shortness of breath, abdominal pain, or vomiting.  Endorses nausea.  Describes the dizziness as feeling as though he is on a boat but also lightheadedness and sometimes as though the room is spinning.  No worsening of symptoms with certain movements or standing too quickly however he does note that his dizziness seems to improve when he is exercising.  Has not tried anything for his symptoms.  He is a non-smoker, denies recreational drug use or alcohol intake.  He does report that his father has had 4 strokes, the first 1 in his 13s.  Also reports his grandfather had a glioblastoma.  The history is provided by the patient.    Past Medical History:  Diagnosis Date  . Depression   . OCD (obsessive compulsive disorder)     Patient Active Problem List   Diagnosis Date Noted  . Posttraumatic stress disorder 11/04/2017  . Bipolar II disorder (HCC) 11/04/2017    Past Surgical History:  Procedure Laterality Date  . KNEE SURGERY Bilateral   . KNEE SURGERY          Home Medications    Prior to Admission medications   Medication Sig Start Date End Date Taking? Authorizing Provider  ALPRAZolam Prudy Feeler) 1 MG tablet Take 1 mg by mouth 2 (two) times daily as needed for anxiety.   Yes [provider]  lamoTRIgine (LAMICTAL) 200 MG tablet Take 1 tablet (200 mg  total) by mouth daily. 12/08/17 03/31/26 Yes Corie Chiquito, PMHNP  sertraline (ZOLOFT) 100 MG tablet Take 2 tablets (200 mg total) by mouth daily. 12/08/17 03/31/26 Yes Corie Chiquito, PMHNP    Family History Family History  Problem Relation Age of Onset  . Anxiety disorder Mother   . CVA Father   . ADD / ADHD Sister     Social History Social History   Tobacco Use  . Smoking status: Never Smoker  . Smokeless tobacco: Never Used  Substance Use Topics  . Alcohol use: Yes    Comment: rarely  . Drug use: No     Allergies   Patient has no known allergies.   Review of Systems Review of Systems  Constitutional: Negative for chills and fever.  Eyes: Positive for visual disturbance.  Respiratory: Negative for shortness of breath.   Cardiovascular: Negative for chest pain.  Gastrointestinal: Negative for abdominal pain, nausea and vomiting.  Neurological: Positive for dizziness, light-headedness and headaches. Negative for syncope, weakness and numbness.  All other systems reviewed and are negative.    Physical Exam Updated Vital Signs BP 128/78 (BP Location: Right Arm)   Pulse 80   Temp 98.2 F (36.8 C) (Oral)   Resp 18   Ht 5\' 8"  (1.727 m)   Wt 115.7 kg   SpO2 100%   BMI 38.77 kg/m   Physical Exam Vitals signs and nursing note reviewed.  Constitutional:  General: He is not in acute distress.    Appearance: He is well-developed.  HENT:     Head: Normocephalic and atraumatic.  Eyes:     General:        Right eye: No discharge.        Left eye: No discharge.     Conjunctiva/sclera: Conjunctivae normal.  Neck:     Musculoskeletal: Normal range of motion and neck supple.     Vascular: No JVD.     Trachea: No tracheal deviation.  Cardiovascular:     Rate and Rhythm: Normal rate and regular rhythm.  Pulmonary:     Effort: Pulmonary effort is normal.     Breath sounds: Normal breath sounds.  Abdominal:     General: Abdomen is flat. There is no  distension.     Tenderness: There is no abdominal tenderness. There is no guarding or rebound.  Skin:    Findings: No erythema.  Neurological:     Mental Status: He is alert and oriented to person, place, and time.     GCS: GCS eye subscore is 4. GCS verbal subscore is 5. GCS motor subscore is 6.     Cranial Nerves: No dysarthria.     Motor: No weakness.     Coordination: Romberg sign positive.     Comments: Mental Status:  Alert, thought content appropriate, able to give a coherent history. Speech fluent without evidence of aphasia. Able to follow 2 step commands without difficulty.  Cranial Nerves:  II:  Peripheral visual fields grossly normal, pupils equal, round, reactive to light III,IV, VI: ptosis not present, extra-ocular motions intact bilaterally  V,VII: smile symmetric, subjectively altered facial light touch on the right as compared to the left VIII: hearing grossly normal to voice  X: uvula elevates symmetrically  XI: bilateral shoulder shrug symmetric and strong XII: midline tongue extension without fassiculations Motor:  Normal tone. 5/5 strength of BUE and BLE major muscle groups including strong and equal dorsiflexion/plantar flexion.  Decreased grip strength on the right due to an old injury.  Patient reports this is chronic and unchanged. Sensory: light touch normal in all extremities. Cerebellar: normal finger-to-nose with bilateral upper extremities, Romberg sign positive Gait: Slightly ataxic gait but overall exhibits good balance.  Able to heel walk and toe walk without difficulty.  Psychiatric:        Behavior: Behavior normal.      ED Treatments / Results  Labs (all labs ordered are listed, but only abnormal results are displayed) Labs Reviewed  COMPREHENSIVE METABOLIC PANEL - Abnormal; Notable for the following components:      Result Value   Glucose, Bld 120 (*)    Calcium 8.7 (*)    All other components within normal limits  RAPID URINE DRUG SCREEN,  HOSP PERFORMED - Abnormal; Notable for the following components:   Benzodiazepines POSITIVE (*)    All other components within normal limits  ETHANOL  PROTIME-INR  APTT  CBC  DIFFERENTIAL  URINALYSIS, ROUTINE W REFLEX MICROSCOPIC  LAMOTRIGINE LEVEL    EKG None  Radiology Mr Brain Wo Contrast (neuro Protocol)  Result Date: 03/31/2018 CLINICAL DATA:  38 y/o M; dizziness and headache for 1 week. Speech difficulty. EXAM: MRI HEAD WITHOUT CONTRAST TECHNIQUE: Multiplanar, multiecho pulse sequences of the brain and surrounding structures were obtained without intravenous contrast. COMPARISON:  None. FINDINGS: Brain: No acute infarction, hemorrhage, hydrocephalus, extra-axial collection or mass lesion. No significant structural or signal abnormality of the brain identified. Vascular: Normal  flow voids. Skull and upper cervical spine: Normal marrow signal. Sinuses/Orbits: Negative. Other: None. IMPRESSION: Negative MRI of the head. Electronically Signed   By: Mitzi HansenLance  Furusawa-Stratton M.D.   On: 03/31/2018 23:31    Procedures Procedures (including critical care time)  Medications Ordered in ED Medications  meclizine (ANTIVERT) tablet 25 mg (25 mg Oral Given 03/31/18 2121)  sodium chloride 0.9 % bolus 1,000 mL (0 mLs Intravenous Stopped 04/01/18 0135)  ketorolac (TORADOL) 30 MG/ML injection 30 mg (30 mg Intravenous Given 04/01/18 0058)  dexamethasone (DECADRON) injection 10 mg (10 mg Intravenous Given 04/01/18 0058)  prochlorperazine (COMPAZINE) injection 10 mg (10 mg Intravenous Given 04/01/18 0058)  diphenhydrAMINE (BENADRYL) injection 25 mg (25 mg Intravenous Given 04/01/18 0058)     Initial Impression / Assessment and Plan / ED Course  I have reviewed the triage vital signs and the nursing notes.  Pertinent labs & imaging results that were available during my care of the patient were reviewed by me and considered in my medical decision making (see chart for details).     Patient presenting for  evaluation of dizziness that has been persistent for 1 week.  Also notes headaches that have been ongoing for several months.  He is afebrile, vital signs are stable.  He is nontoxic in appearance.  He does endorse some disequilibrium and feeling unsteady with ambulation however he overall does ambulate with a steady gait and exhibits good balance.  Romberg sign positive on examination.  Given the duration of his symptoms, we will obtain an MRI of the brain for further evaluation and rule out of stroke, mass, or other acute intracranial abnormality.  Low suspicion of ICH.  Lab work reviewed by me shows no leukocytosis, no anemia, no metabolic derangements.  UDS is positive for benzodiazepines but he is prescribed Xanax to help him sleep.  Remainder of lab work reviewed by me unremarkable.  MRI of the brain unremarkable. No evidence of mass or infarct. Doubt MS or acoustic neuroma. Spoke with Dr. Amada JupiterKirkpatrick with neurology who states that symptoms could be consistent with complex migraine. We will give migraine cocktail.  He was given meclizine in the ED with no improvement in his symptoms.  He has an appointment to establish care with a neurologist next month.  Discussed strict ED return precautions.  Patient and wife verbalized understanding of and agreement with plan and patient stable for discharge home at this time.  Final Clinical Impressions(s) / ED Diagnoses   Final diagnoses:  Dizziness  Right-sided headache    ED Discharge Orders    None       Bennye AlmFawze, Michel Hendon A, PA-C 04/01/18 1626    Gerhard MunchLockwood, Robert, MD 04/01/18 2320

## 2018-04-01 ENCOUNTER — Other Ambulatory Visit: Payer: Self-pay | Admitting: Psychiatry

## 2018-04-01 DIAGNOSIS — F4310 Post-traumatic stress disorder, unspecified: Secondary | ICD-10-CM

## 2018-04-01 DIAGNOSIS — F422 Mixed obsessional thoughts and acts: Secondary | ICD-10-CM

## 2018-04-01 NOTE — Discharge Instructions (Signed)
Plenty fluids and get plenty of rest.  Continue to take your home medicines as prescribed.  You can take 600 mg of ibuprofen every 6 hours as needed for pain with food.  You can also take 500 to 1000 mg of Tylenol every 6 hours as needed.  You can also alternate these medicines every 3-4 hours as needed for pain.   Follow-up with neurology for reevaluation of symptoms.  Return to the emergency department if any concerning signs or symptoms develop such as slurred speech, facial droop, weakness to one side of the body, severe headaches, high fevers, altered mental status.

## 2018-04-03 LAB — LAMOTRIGINE LEVEL: Lamotrigine Lvl: NOT DETECTED ug/mL (ref 2.0–20.0)

## 2018-04-11 ENCOUNTER — Telehealth: Payer: Self-pay

## 2018-04-11 NOTE — Telephone Encounter (Signed)
Several appeals had been submitted since November 2019 for a sleep study, an approval was faxed in on 04/06/2018.

## 2018-05-16 ENCOUNTER — Telehealth: Payer: Self-pay | Admitting: *Deleted

## 2018-05-16 NOTE — Telephone Encounter (Signed)
Called pt on mobile, vm box full. Called home, spoke with wife. She stated she was going to cancel his appt d/t covid situation. I advised that we would not be having his visit in office however we recommend converting to a telemedicine visit with smart phone or laptop with camera. Alternatively he can r/s if preferred. She verbalized appreciation and will have him call back to discuss appt options. Wife given call back #.

## 2018-05-17 ENCOUNTER — Encounter

## 2018-05-17 ENCOUNTER — Ambulatory Visit: Payer: Self-pay | Admitting: Neurology

## 2018-05-18 ENCOUNTER — Other Ambulatory Visit: Payer: Self-pay | Admitting: Psychiatry

## 2018-06-19 ENCOUNTER — Other Ambulatory Visit: Payer: Self-pay

## 2018-06-19 ENCOUNTER — Encounter: Payer: Self-pay | Admitting: Psychiatry

## 2018-06-19 ENCOUNTER — Ambulatory Visit (INDEPENDENT_AMBULATORY_CARE_PROVIDER_SITE_OTHER): Payer: BC Managed Care – PPO | Admitting: Psychiatry

## 2018-06-19 DIAGNOSIS — F99 Mental disorder, not otherwise specified: Secondary | ICD-10-CM | POA: Diagnosis not present

## 2018-06-19 DIAGNOSIS — F422 Mixed obsessional thoughts and acts: Secondary | ICD-10-CM | POA: Diagnosis not present

## 2018-06-19 DIAGNOSIS — F5105 Insomnia due to other mental disorder: Secondary | ICD-10-CM | POA: Diagnosis not present

## 2018-06-19 DIAGNOSIS — F4310 Post-traumatic stress disorder, unspecified: Secondary | ICD-10-CM

## 2018-06-19 MED ORDER — LAMOTRIGINE 200 MG PO TABS: 200.0000 mg | ORAL_TABLET | Freq: Every day | ORAL | 0 refills | Status: DC

## 2018-06-19 MED ORDER — SERTRALINE HCL 100 MG PO TABS: 200.0000 mg | ORAL_TABLET | Freq: Every day | ORAL | 0 refills | Status: DC

## 2018-06-19 MED ORDER — ALPRAZOLAM 1 MG PO TABS: 1.0000 mg | ORAL_TABLET | Freq: Two times a day (BID) | ORAL | 2 refills | Status: DC | PRN

## 2018-06-19 MED ORDER — TRAZODONE HCL 100 MG PO TABS: ORAL_TABLET | ORAL | 1 refills | Status: DC

## 2018-06-19 NOTE — Progress Notes (Signed)
Todd Tucker [id number] [birth year] 38 y.o.  Virtual Visit via Video Note  I connected with@ on 06/19/18 at  9:30 AM EDT by a video enabled telemedicine application and verified that I am speaking with the correct person using two identifiers.   I discussed the limitations of evaluation and management by telemedicine and the availability of in person appointments. The patient expressed understanding and agreed to proceed.  I discussed the assessment and treatment plan with the patient. The patient was provided an opportunity to ask questions and all were answered. The patient agreed with the plan and demonstrated an understanding of the instructions.   The patient was advised to call back or seek an in-person evaluation if the symptoms worsen or if the condition fails to improve as anticipated.  I provided 30 minutes of non-face-to-face time during this encounter.  The patient was located at home.  The provider was located at home.   Corie ChiquitoJessica Joban Colledge, PMHNP   Subjective:   Patient ID:  Todd McclintockDamon Domeier is a 38 y.o. (DOB [birth year]) male.  Chief Complaint:  Chief Complaint  Patient presents with  . Insomnia  . Follow-up    h/o anxiety and depression    HPI Todd Tucker presents for follow-up of sleep disturbance, anxiety, and history of mood disturbance. He reports "I actually feel really good." He reports that his anxiety has been ok. Reports that his mood has been stable. Denies depressed mood or irritable moods. He reports that he has been bored in response to stay at home order with the pandemic. Denies any recent intrusive thoughts.   He reports that he awakens every night around 3 am. He reports that he is unable to fall asleep without Xanax. He reports that he feels groggy if he takes Xanax 2 mg po  QHS. Reports that he has been experiencing some restless legs. Denies nightmares. Denies feeling anxious when time to fall asleep. He reports that his appetite has been normal. Describes  energy and motivation as "great." Denies SI.   He denies taking any medications for sleep in the past. Reports that he is planning to pick up cPap machine soon.   Review of Systems:  Review of Systems  Musculoskeletal: Negative for gait problem.  Neurological: Negative for tremors.  Psychiatric/Behavioral:       Please refer to HPI    Medications: I have reviewed the patient's current medications.  Current Outpatient Medications  Medication Sig Dispense Refill  . ALPRAZolam (XANAX) 1 MG tablet Take 1 tablet (1 mg total) by mouth 2 (two) times daily as needed for up to 30 days. 60 tablet 2  . lamoTRIgine (LAMICTAL) 200 MG tablet Take 1 tablet (200 mg total) by mouth daily. 90 tablet 0  . sertraline (ZOLOFT) 100 MG tablet Take 2 tablets (200 mg total) by mouth daily. 180 tablet 0  . traZODone (DESYREL) 100 MG tablet Take 1/2-1 tablet po QHS prn insomnia 30 tablet 1   No current facility-administered medications for this visit.     Medication Side Effects: None  Allergies: No Known Allergies  Past Medical History:  Diagnosis Date  . Depression   . OCD (obsessive compulsive disorder)     Family History  Problem Relation Age of Onset  . Anxiety disorder Mother   . CVA Father   . ADD / ADHD Sister     Social History   Socioeconomic History  . Marital status: Married    Spouse name: Not on file  . Number of children:  Not on file  . Years of education: Not on file  . Highest education level: Not on file  Occupational History  . Not on file  Social Needs  . Financial resource strain: Not on file  . Food insecurity:    Worry: Not on file    Inability: Not on file  . Transportation needs:    Medical: Not on file    Non-medical: Not on file  Tobacco Use  . Smoking status: Never Smoker  . Smokeless tobacco: Never Used  Substance and Sexual Activity  . Alcohol use: Yes    Comment: rarely  . Drug use: No  . Sexual activity: Not on file  Lifestyle  . Physical  activity:    Days per week: Not on file    Minutes per session: Not on file  . Stress: Not on file  Relationships  . Social connections:    Talks on phone: Not on file    Gets together: Not on file    Attends religious service: Not on file    Active member of club or organization: Not on file    Attends meetings of clubs or organizations: Not on file    Relationship status: Not on file  . Intimate partner violence:    Fear of current or ex partner: Not on file    Emotionally abused: Not on file    Physically abused: Not on file    Forced sexual activity: Not on file  Other Topics Concern  . Not on file  Social History Narrative  . Not on file    Past Medical History, Surgical history, Social history, and Family history were reviewed and updated as appropriate.   Please see review of systems for further details on the patient's review from today.   Objective:   Physical Exam:  There were no vitals taken for this visit.  Physical Exam Neurological:     Mental Status: He is alert and oriented to person, place, and time.     Cranial Nerves: No dysarthria.  Psychiatric:        Attention and Perception: Attention normal.        Mood and Affect: Mood normal.        Speech: Speech normal.        Behavior: Behavior is cooperative.        Thought Content: Thought content normal. Thought content is not paranoid or delusional. Thought content does not include homicidal or suicidal ideation. Thought content does not include homicidal or suicidal plan.        Cognition and Memory: Cognition and memory normal.        Judgment: Judgment normal.     Lab Review:     Component Value Date/Time   NA 139 03/31/2018 2138   K 3.7 03/31/2018 2138   CL 104 03/31/2018 2138   CO2 23 03/31/2018 2138   GLUCOSE 120 (H) 03/31/2018 2138   BUN 15 03/31/2018 2138   CREATININE 1.03 03/31/2018 2138   CALCIUM 8.7 (L) 03/31/2018 2138   PROT 6.8 03/31/2018 2138   ALBUMIN 3.8 03/31/2018 2138   AST  21 03/31/2018 2138   ALT 15 03/31/2018 2138   ALKPHOS 76 03/31/2018 2138   BILITOT 0.8 03/31/2018 2138   GFRNONAA >60 03/31/2018 2138   GFRAA >60 03/31/2018 2138       Component Value Date/Time   WBC 6.5 03/31/2018 2138   RBC 4.92 03/31/2018 2138   HGB 14.9 03/31/2018 2138   HCT  45.5 03/31/2018 2138   PLT 226 03/31/2018 2138   MCV 92.5 03/31/2018 2138   MCH 30.3 03/31/2018 2138   MCHC 32.7 03/31/2018 2138   RDW 12.9 03/31/2018 2138   LYMPHSABS 2.0 03/31/2018 2138   MONOABS 0.4 03/31/2018 2138   EOSABS 0.2 03/31/2018 2138   BASOSABS 0.0 03/31/2018 2138    No results found for: POCLITH, LITHIUM   No results found for: PHENYTOIN, PHENOBARB, VALPROATE, CBMZ   .res Assessment: Plan:   Discussed treatment options to improve insomnia and in agreement that starting CPAP machine would likely improve sleep quality.  Also discussed potential benefits, risk, and side effects of trazodone for insomnia.  Discussed that since trazodone is an antidepressant that it would not suppress respiration which is an important consideration with diagnosis of sleep apnea.  Discussed starting with 50 mg at bedtime and then increasing to 100 mg if not effective and well-tolerated.  Recommended patient contact office if he is having any significant tolerability issues or trazodone is ineffective. Continue sertraline 200 mg daily for depression and anxiety. Continue lamotrigine 200 mg daily for mood stabilization. Continue Xanax 1 mg twice daily as needed anxiety and insomnia. Patient to follow-up with provider in 4 weeks or sooner if clinically indicated. Patient advised to contact office with any questions, adverse effects, or acute worsening in signs and symptoms.  Insomnia due to other mental disorder - Plan: traZODone (DESYREL) 100 MG tablet, ALPRAZolam (XANAX) 1 MG tablet  Mixed obsessional thoughts and acts - Plan: sertraline (ZOLOFT) 100 MG tablet, lamoTRIgine (LAMICTAL) 200 MG tablet, ALPRAZolam  (XANAX) 1 MG tablet  PTSD (post-traumatic stress disorder) - Plan: sertraline (ZOLOFT) 100 MG tablet, ALPRAZolam (XANAX) 1 MG tablet  Please see After Visit Summary for patient specific instructions.  No future appointments.  No orders of the defined types were placed in this encounter.     -------------------------------

## 2018-07-03 ENCOUNTER — Telehealth: Payer: Self-pay | Admitting: Psychiatry

## 2018-07-03 ENCOUNTER — Other Ambulatory Visit: Payer: Self-pay | Admitting: Psychiatry

## 2018-07-03 DIAGNOSIS — F5105 Insomnia due to other mental disorder: Secondary | ICD-10-CM

## 2018-07-03 DIAGNOSIS — F4310 Post-traumatic stress disorder, unspecified: Secondary | ICD-10-CM

## 2018-07-03 DIAGNOSIS — F99 Mental disorder, not otherwise specified: Secondary | ICD-10-CM

## 2018-07-03 DIAGNOSIS — F422 Mixed obsessional thoughts and acts: Secondary | ICD-10-CM

## 2018-07-03 MED ORDER — ALPRAZOLAM 1 MG PO TABS: 1.0000 mg | ORAL_TABLET | Freq: Two times a day (BID) | ORAL | 2 refills | Status: DC | PRN

## 2018-07-03 NOTE — Telephone Encounter (Signed)
Patient stated pharmacy did not receive refills for Xanax pt will be out of medication as of tomorrow

## 2018-08-30 ENCOUNTER — Other Ambulatory Visit: Payer: Self-pay | Admitting: Psychiatry

## 2018-08-30 DIAGNOSIS — F5105 Insomnia due to other mental disorder: Secondary | ICD-10-CM

## 2018-08-31 ENCOUNTER — Telehealth: Payer: Self-pay | Admitting: Psychiatry

## 2018-08-31 DIAGNOSIS — F422 Mixed obsessional thoughts and acts: Secondary | ICD-10-CM

## 2018-08-31 DIAGNOSIS — F4310 Post-traumatic stress disorder, unspecified: Secondary | ICD-10-CM

## 2018-08-31 DIAGNOSIS — F5105 Insomnia due to other mental disorder: Secondary | ICD-10-CM

## 2018-08-31 MED ORDER — SERTRALINE HCL 100 MG PO TABS: 200.0000 mg | ORAL_TABLET | Freq: Every day | ORAL | 0 refills | Status: DC

## 2018-08-31 MED ORDER — LAMOTRIGINE 200 MG PO TABS: 200.0000 mg | ORAL_TABLET | Freq: Every day | ORAL | 0 refills | Status: DC

## 2018-08-31 MED ORDER — ALPRAZOLAM 1 MG PO TABS: 1.0000 mg | ORAL_TABLET | Freq: Two times a day (BID) | ORAL | 2 refills | Status: DC | PRN

## 2018-08-31 NOTE — Telephone Encounter (Signed)
Looks like a phone call requesting all medications for refills, pt hasn't followed up. No pending appt.

## 2018-08-31 NOTE — Telephone Encounter (Signed)
Patient need refill on all medications submitted to [facility] [address] in [place].  Patient has relocated to [facility]

## 2018-09-04 NOTE — Telephone Encounter (Signed)
Pt. Made aware and said that is the correct pharmacy.

## 2018-10-02 ENCOUNTER — Other Ambulatory Visit: Payer: Self-pay | Admitting: Psychiatry

## 2018-10-02 DIAGNOSIS — F5105 Insomnia due to other mental disorder: Secondary | ICD-10-CM

## 2018-10-03 NOTE — Telephone Encounter (Signed)
Last appt April, looks like due back 4 weeks from then. Does he need an appt also?

## 2018-10-30 ENCOUNTER — Other Ambulatory Visit: Payer: Self-pay | Admitting: Psychiatry

## 2018-10-30 DIAGNOSIS — F99 Mental disorder, not otherwise specified: Secondary | ICD-10-CM

## 2018-10-30 DIAGNOSIS — F5105 Insomnia due to other mental disorder: Secondary | ICD-10-CM

## 2018-10-31 NOTE — Telephone Encounter (Signed)
Patient over due for follow up, last visit 07/17/2018

## 2018-12-02 ENCOUNTER — Other Ambulatory Visit: Payer: Self-pay | Admitting: Psychiatry

## 2018-12-02 DIAGNOSIS — F99 Mental disorder, not otherwise specified: Secondary | ICD-10-CM

## 2018-12-02 DIAGNOSIS — F5105 Insomnia due to other mental disorder: Secondary | ICD-10-CM

## 2018-12-26 ENCOUNTER — Other Ambulatory Visit: Payer: Self-pay | Admitting: Psychiatry

## 2018-12-26 DIAGNOSIS — F5105 Insomnia due to other mental disorder: Secondary | ICD-10-CM

## 2018-12-26 DIAGNOSIS — F99 Mental disorder, not otherwise specified: Secondary | ICD-10-CM

## 2018-12-26 NOTE — Telephone Encounter (Signed)
Needs apt

## 2019-01-10 ENCOUNTER — Ambulatory Visit (INDEPENDENT_AMBULATORY_CARE_PROVIDER_SITE_OTHER): Payer: BC Managed Care – PPO | Admitting: Psychiatry

## 2019-01-10 ENCOUNTER — Other Ambulatory Visit: Payer: Self-pay

## 2019-01-10 ENCOUNTER — Encounter: Payer: Self-pay | Admitting: Psychiatry

## 2019-01-10 DIAGNOSIS — F99 Mental disorder, not otherwise specified: Secondary | ICD-10-CM

## 2019-01-10 DIAGNOSIS — F5105 Insomnia due to other mental disorder: Secondary | ICD-10-CM

## 2019-01-10 DIAGNOSIS — F4310 Post-traumatic stress disorder, unspecified: Secondary | ICD-10-CM | POA: Diagnosis not present

## 2019-01-10 DIAGNOSIS — F422 Mixed obsessional thoughts and acts: Secondary | ICD-10-CM | POA: Diagnosis not present

## 2019-01-10 DIAGNOSIS — F3181 Bipolar II disorder: Secondary | ICD-10-CM

## 2019-01-10 MED ORDER — LAMOTRIGINE 200 MG PO TABS: 200.0000 mg | ORAL_TABLET | Freq: Every day | ORAL | 1 refills | Status: DC

## 2019-01-10 MED ORDER — ALPRAZOLAM 1 MG PO TABS: 1.0000 mg | ORAL_TABLET | Freq: Two times a day (BID) | ORAL | 5 refills | Status: DC | PRN

## 2019-01-10 MED ORDER — SERTRALINE HCL 100 MG PO TABS: 200.0000 mg | ORAL_TABLET | Freq: Every day | ORAL | 1 refills | Status: DC

## 2019-01-10 MED ORDER — TRAZODONE HCL 100 MG PO TABS: ORAL_TABLET | ORAL | 1 refills | Status: DC

## 2019-01-10 NOTE — Progress Notes (Signed)
Alin Chavira [id number] [birth year] 38 y.o.  Virtual Visit via Telephone Note  I connected with pt on 01/10/19 at  9:30 AM EST by telephone and verified that I am speaking with the correct person using two identifiers.   I discussed the limitations, risks, security and privacy concerns of performing an evaluation and management service by telephone and the availability of in person appointments. I also discussed with the patient that there may be a patient responsible charge related to this service. The patient expressed understanding and agreed to proceed.   I discussed the assessment and treatment plan with the patient. The patient was provided an opportunity to ask questions and all were answered. The patient agreed with the plan and demonstrated an understanding of the instructions.   The patient was advised to call back or seek an in-person evaluation if the symptoms worsen or if the condition fails to improve as anticipated.  I provided 20 minutes of non-face-to-face time during this encounter.  The patient was located at home.  The provider was located at [facility].   Corie Chiquito, PMHNP   Subjective:   Patient ID:  Todd Tucker is a 38 y.o. (DOB Oct 23, 1980) male.  Chief Complaint:  Chief Complaint  Patient presents with  . Follow-up    h/o anxiety, insomnia, mood disturbance    HPI Todd Tucker presents for follow-up of anxiety, depression, and insomnia. He reports that he and his wife have relocated to the [place] area.   He reports the he has been "really good." Denies depressed mood. Denies any hypomanic s/s. He reports that his anxiety has been well controlled. Denies any significant obsessive thoughts. He reports improved sleep. Reports that he was not able to tolerate wearing cPap. Reports sleeping 6-7 hours a night. Appetite has been "normal." He reports that his energy and motivation have been good. Concentration has been adequate. Denies SI.   He and  his wife has noticed that certain foods, such as red meat, seem to negatively affect his mood. He reports that he has been making adjustments accordingly. He reports that last panic attack occurred after eating red meat. Also notices having a HA after eating red meat.   Reports taking Trazodone 100 mg po QHS and Xanax 1 mg po QHS at bedtime with good response. Infrequently needing an add'l Xanax prn.   Review of Systems:  Review of Systems  Musculoskeletal: Negative for gait problem.  Neurological: Negative for tremors.       HA's have been less frequent. Typically will rest at the onset of HA.   Psychiatric/Behavioral:       Please refer to HPI    Medications: I have reviewed the patient's current medications.  Current Outpatient Medications  Medication Sig Dispense Refill  . sertraline (ZOLOFT) 100 MG tablet Take 2 tablets (200 mg total) by mouth daily. 180 tablet 1  . traZODone (DESYREL) 100 MG tablet TAKE 1/2 TO 1 TABLET BY MOUTH EVERY NIGHT AT BEDTIME AS NEEDED FOR INSOMNIA 90 tablet 1  . ALPRAZolam (XANAX) 1 MG tablet Take 1 tablet (1 mg total) by mouth 2 (two) times daily as needed. 60 tablet 5  . lamoTRIgine (LAMICTAL) 200 MG tablet Take 1 tablet (200 mg total) by mouth daily. 90 tablet 1   No current facility-administered medications for this visit.     Medication Side Effects: None  Allergies: No Known Allergies  Past Medical History:  Diagnosis Date  . Depression   . OCD (obsessive compulsive disorder)  Family History  Problem Relation Age of Onset  . Anxiety disorder Mother   . CVA Father   . ADD / ADHD Sister     Social History   Socioeconomic History  . Marital status: Married    Spouse name: Not on file  . Number of children: Not on file  . Years of education: Not on file  . Highest education level: Not on file  Occupational History  . Not on file  Social Needs  . Financial resource strain: Not on file  . Food insecurity    Worry: Not on file     Inability: Not on file  . Transportation needs    Medical: Not on file    Non-medical: Not on file  Tobacco Use  . Smoking status: Never Smoker  . Smokeless tobacco: Never Used  Substance and Sexual Activity  . Alcohol use: Yes    Comment: rarely  . Drug use: No  . Sexual activity: Not on file  Lifestyle  . Physical activity    Days per week: Not on file    Minutes per session: Not on file  . Stress: Not on file  Relationships  . Social Musicianconnections    Talks on phone: Not on file    Gets together: Not on file    Attends religious service: Not on file    Active member of club or organization: Not on file    Attends meetings of clubs or organizations: Not on file    Relationship status: Not on file  . Intimate partner violence    Fear of current or ex partner: Not on file    Emotionally abused: Not on file    Physically abused: Not on file    Forced sexual activity: Not on file  Other Topics Concern  . Not on file  Social History Narrative  . Not on file    Past Medical History, Surgical history, Social history, and Family history were reviewed and updated as appropriate.   Please see review of systems for further details on the patient's review from today.   Objective:   Physical Exam:  There were no vitals taken for this visit.  Physical Exam Neurological:     Mental Status: He is alert and oriented to person, place, and time.     Cranial Nerves: No dysarthria.  Psychiatric:        Attention and Perception: Attention normal.        Mood and Affect: Mood normal.        Speech: Speech normal.        Behavior: Behavior is cooperative.        Thought Content: Thought content normal. Thought content is not paranoid or delusional. Thought content does not include homicidal or suicidal ideation. Thought content does not include homicidal or suicidal plan.        Cognition and Memory: Cognition and memory normal.        Judgment: Judgment normal.     Lab Review:      Component Value Date/Time   NA 139 03/31/2018 2138   K 3.7 03/31/2018 2138   CL 104 03/31/2018 2138   CO2 23 03/31/2018 2138   GLUCOSE 120 (H) 03/31/2018 2138   BUN 15 03/31/2018 2138   CREATININE 1.03 03/31/2018 2138   CALCIUM 8.7 (L) 03/31/2018 2138   PROT 6.8 03/31/2018 2138   ALBUMIN 3.8 03/31/2018 2138   AST 21 03/31/2018 2138   ALT 15 03/31/2018 2138  ALKPHOS 76 03/31/2018 2138   BILITOT 0.8 03/31/2018 2138   GFRNONAA >60 03/31/2018 2138   GFRAA >60 03/31/2018 2138       Component Value Date/Time   WBC 6.5 03/31/2018 2138   RBC 4.92 03/31/2018 2138   HGB 14.9 03/31/2018 2138   HCT 45.5 03/31/2018 2138   PLT 226 03/31/2018 2138   MCV 92.5 03/31/2018 2138   MCH 30.3 03/31/2018 2138   MCHC 32.7 03/31/2018 2138   RDW 12.9 03/31/2018 2138   LYMPHSABS 2.0 03/31/2018 2138   MONOABS 0.4 03/31/2018 2138   EOSABS 0.2 03/31/2018 2138   BASOSABS 0.0 03/31/2018 2138    No results found for: POCLITH, LITHIUM   No results found for: PHENYTOIN, PHENOBARB, VALPROATE, CBMZ   .res Assessment: Plan:   Will continue current plan of care since target signs and symptoms are well controlled without any tolerability issues. Continue Sertraline 200 mg po qd for PTSD, GAD, and depression. Continue Lamictal 200 mg po qd for mood stabilization. Continue Trazodone for insomnia.  Continue Xanax prn anxiety. Pt to f/u in 6 months or sooner if clinically indicated. Patient advised to contact office with any questions, adverse effects, or acute worsening in signs and symptoms.  Todd Tucker was seen today for follow-up.  Diagnoses and all orders for this visit:  PTSD (post-traumatic stress disorder) -     ALPRAZolam (XANAX) 1 MG tablet; Take 1 tablet (1 mg total) by mouth 2 (two) times daily as needed. -     sertraline (ZOLOFT) 100 MG tablet; Take 2 tablets (200 mg total) by mouth daily.  Insomnia due to other mental disorder -     ALPRAZolam (XANAX) 1 MG tablet; Take 1 tablet (1 mg  total) by mouth 2 (two) times daily as needed. -     traZODone (DESYREL) 100 MG tablet; TAKE 1/2 TO 1 TABLET BY MOUTH EVERY NIGHT AT BEDTIME AS NEEDED FOR INSOMNIA  Mixed obsessional thoughts and acts -     ALPRAZolam (XANAX) 1 MG tablet; Take 1 tablet (1 mg total) by mouth 2 (two) times daily as needed. -     lamoTRIgine (LAMICTAL) 200 MG tablet; Take 1 tablet (200 mg total) by mouth daily. -     sertraline (ZOLOFT) 100 MG tablet; Take 2 tablets (200 mg total) by mouth daily.  Bipolar II disorder ([place])    Please see After Visit Summary for patient specific instructions.  No future appointments.  No orders of the defined types were placed in this encounter.     -------------------------------

## 2019-07-29 ENCOUNTER — Other Ambulatory Visit: Payer: Self-pay | Admitting: Psychiatry

## 2019-07-29 DIAGNOSIS — F99 Mental disorder, not otherwise specified: Secondary | ICD-10-CM

## 2019-07-29 DIAGNOSIS — F5105 Insomnia due to other mental disorder: Secondary | ICD-10-CM

## 2019-08-17 ENCOUNTER — Other Ambulatory Visit: Payer: Self-pay | Admitting: Psychiatry

## 2019-08-17 DIAGNOSIS — F4310 Post-traumatic stress disorder, unspecified: Secondary | ICD-10-CM

## 2019-08-17 DIAGNOSIS — F99 Mental disorder, not otherwise specified: Secondary | ICD-10-CM

## 2019-08-17 DIAGNOSIS — F5105 Insomnia due to other mental disorder: Secondary | ICD-10-CM

## 2019-08-17 DIAGNOSIS — F422 Mixed obsessional thoughts and acts: Secondary | ICD-10-CM

## 2019-08-19 NOTE — Telephone Encounter (Signed)
Last apt 01/10/2019 due back 6 months, nothing scheduled yet

## 2019-12-20 ENCOUNTER — Other Ambulatory Visit: Payer: Self-pay | Admitting: Psychiatry

## 2019-12-20 DIAGNOSIS — F99 Mental disorder, not otherwise specified: Secondary | ICD-10-CM

## 2019-12-20 DIAGNOSIS — F4310 Post-traumatic stress disorder, unspecified: Secondary | ICD-10-CM

## 2019-12-20 DIAGNOSIS — F422 Mixed obsessional thoughts and acts: Secondary | ICD-10-CM

## 2019-12-20 DIAGNOSIS — F5105 Insomnia due to other mental disorder: Secondary | ICD-10-CM

## 2019-12-20 NOTE — Telephone Encounter (Signed)
Still has not scheduled apt, last visit 12/2018

## 2020-02-19 ENCOUNTER — Telehealth: Payer: Self-pay | Admitting: Psychiatry

## 2020-02-19 DIAGNOSIS — F5105 Insomnia due to other mental disorder: Secondary | ICD-10-CM

## 2020-02-19 DIAGNOSIS — F422 Mixed obsessional thoughts and acts: Secondary | ICD-10-CM

## 2020-02-19 DIAGNOSIS — F99 Mental disorder, not otherwise specified: Secondary | ICD-10-CM

## 2020-02-19 DIAGNOSIS — F4310 Post-traumatic stress disorder, unspecified: Secondary | ICD-10-CM

## 2020-02-19 MED ORDER — ALPRAZOLAM 1 MG PO TABS: 1.0000 mg | ORAL_TABLET | Freq: Two times a day (BID) | ORAL | 1 refills | Status: DC | PRN

## 2020-02-19 NOTE — Telephone Encounter (Signed)
Todd Tucker just called to get a refill on his med's. He has not been seen since 12/2018. How much time do I need to allow for his appointment with you? Thanks

## 2020-02-19 NOTE — Telephone Encounter (Signed)
Pt returned call. Apt 2/14 first available. Requesting refills for Alprazolam until apt date @ [facility] on file.

## 2020-02-19 NOTE — Telephone Encounter (Signed)
Script sent  

## 2020-02-19 NOTE — Telephone Encounter (Signed)
I've left him a message for him to call back for an appointment.

## 2020-04-06 ENCOUNTER — Ambulatory Visit (INDEPENDENT_AMBULATORY_CARE_PROVIDER_SITE_OTHER): Payer: BC Managed Care – PPO | Admitting: Psychiatry

## 2020-04-06 ENCOUNTER — Other Ambulatory Visit: Payer: Self-pay

## 2020-04-06 ENCOUNTER — Encounter: Payer: Self-pay | Admitting: Psychiatry

## 2020-04-06 DIAGNOSIS — F422 Mixed obsessional thoughts and acts: Secondary | ICD-10-CM | POA: Diagnosis not present

## 2020-04-06 DIAGNOSIS — F99 Mental disorder, not otherwise specified: Secondary | ICD-10-CM

## 2020-04-06 DIAGNOSIS — F5105 Insomnia due to other mental disorder: Secondary | ICD-10-CM | POA: Diagnosis not present

## 2020-04-06 DIAGNOSIS — F4310 Post-traumatic stress disorder, unspecified: Secondary | ICD-10-CM | POA: Diagnosis not present

## 2020-04-06 MED ORDER — LAMOTRIGINE 200 MG PO TABS: 200.0000 mg | ORAL_TABLET | Freq: Every day | ORAL | 1 refills | Status: DC

## 2020-04-06 MED ORDER — ALPRAZOLAM 1 MG PO TABS: 1.0000 mg | ORAL_TABLET | Freq: Two times a day (BID) | ORAL | 3 refills | Status: DC | PRN

## 2020-04-06 MED ORDER — TRAZODONE HCL 100 MG PO TABS
ORAL_TABLET | ORAL | 1 refills | Status: DC
Start: 2020-04-06 — End: 2020-10-08

## 2020-04-06 MED ORDER — SERTRALINE HCL 100 MG PO TABS: 200.0000 mg | ORAL_TABLET | Freq: Every day | ORAL | 1 refills | Status: DC

## 2020-04-06 NOTE — Progress Notes (Signed)
Todd Tucker [id number] 19-May-1980 40 y.o.  Subjective:   Patient ID:  Todd Tucker is a 40 y.o. (DOB 1980-10-15) male.  Chief Complaint:  Chief Complaint  Patient presents with  . Follow-up    H/o mood disturbance and anxiety    HPI Todd Tucker presents to the office today for follow-up of mood disturbance and anxiety.  He reports that he has been doing "very well." He reports that anxiety has been well controlled. Denies panic s/s or intrusive thoughts. Mood has been "great." Denies depressed or irritable moods. Denies elevated moods or impulsivity. Sleep has improved with cPap. Reports improved quality.  Sleeps 7-8 hours without awakening. Energy and motivation have been good. Concentration has been good. Denies SI.   He is now living in the [place] area and enjoying this. Transferred with several coworkers. Teaches exercise physiology and coaches.   He reports that he typically takes Xanax 1 mg before bedtime. Will take second Xanax prn on rare occasions, such as before flying. Reports that Trazodone does not seem to be necessary to fall asleep.   Review of Systems:  Review of Systems  Musculoskeletal: Negative for gait problem.  Neurological: Negative for tremors.  Psychiatric/Behavioral:       Please refer to HPI    Medications: I have reviewed the patient's current medications.  Current Outpatient Medications  Medication Sig Dispense Refill  . Nutritional Supplements (JUICE PLUS FIBRE PO) Take by mouth.    [name] ON 05/04/2020] ALPRAZolam (XANAX) 1 MG tablet Take 1 tablet (1 mg total) by mouth 2 (two) times daily as needed. 60 tablet 3  . lamoTRIgine (LAMICTAL) 200 MG tablet Take 1 tablet (200 mg total) by mouth daily. 90 tablet 1  . sertraline (ZOLOFT) 100 MG tablet Take 2 tablets (200 mg total) by mouth daily. 180 tablet 1  . traZODone (DESYREL) 100 MG tablet TAKE 1/2 TO ONE TABLET BY MOUTH EVERY NIGHT AT BEDTIME AS NEEDED FOR INSOMNIA 90 tablet 1   No current  facility-administered medications for this visit.    Medication Side Effects: None  Allergies: No Known Allergies  Past Medical History:  Diagnosis Date  . Depression   . OCD (obsessive compulsive disorder)   . Sleep apnea     Family History  Problem Relation Age of Onset  . Anxiety disorder Mother   . CVA Father   . ADD / ADHD Sister     Social History   Socioeconomic History  . Marital status: Married    Spouse name: Not on file  . Number of children: Not on file  . Years of education: Not on file  . Highest education level: Not on file  Occupational History  . Not on file  Tobacco Use  . Smoking status: Never Smoker  . Smokeless tobacco: Never Used  Vaping Use  . Vaping Use: Never used  Substance and Sexual Activity  . Alcohol use: Yes    Comment: rarely  . Drug use: No  . Sexual activity: Not on file  Other Topics Concern  . Not on file  Social History Narrative  . Not on file   Social Determinants of Health   Financial Resource Strain: Not on file  Food Insecurity: Not on file  Transportation Needs: Not on file  Physical Activity: Not on file  Stress: Not on file  Social Connections: Not on file  Intimate Partner Violence: Not on file    Past Medical History, Surgical history, Social history, and Family history were  reviewed and updated as appropriate.   Please see review of systems for further details on the patient's review from today.   Objective:   Physical Exam:  BP (!) 159/103   Pulse 71   Wt 270 lb (122.5 kg)   BMI 41.05 kg/m   He reports that BP is typically 120/80-130/80  Physical Exam Constitutional:      General: He is not in acute distress. Musculoskeletal:        General: No deformity.  Neurological:     Mental Status: He is alert and oriented to person, place, and time.     Coordination: Coordination normal.  Psychiatric:        Attention and Perception: Attention and perception normal. He does not perceive auditory  or visual hallucinations.        Mood and Affect: Mood normal. Mood is not anxious or depressed. Affect is not labile, blunt, angry or inappropriate.        Speech: Speech normal.        Behavior: Behavior normal.        Thought Content: Thought content normal. Thought content is not paranoid or delusional. Thought content does not include homicidal or suicidal ideation. Thought content does not include homicidal or suicidal plan.        Cognition and Memory: Cognition and memory normal.        Judgment: Judgment normal.     Comments: Insight intact     Lab Review:     Component Value Date/Time   NA 139 03/31/2018 2138   K 3.7 03/31/2018 2138   CL 104 03/31/2018 2138   CO2 23 03/31/2018 2138   GLUCOSE 120 (H) 03/31/2018 2138   BUN 15 03/31/2018 2138   CREATININE 1.03 03/31/2018 2138   CALCIUM 8.7 (L) 03/31/2018 2138   PROT 6.8 03/31/2018 2138   ALBUMIN 3.8 03/31/2018 2138   AST 21 03/31/2018 2138   ALT 15 03/31/2018 2138   ALKPHOS 76 03/31/2018 2138   BILITOT 0.8 03/31/2018 2138   GFRNONAA >60 03/31/2018 2138   GFRAA >60 03/31/2018 2138       Component Value Date/Time   WBC 6.5 03/31/2018 2138   RBC 4.92 03/31/2018 2138   HGB 14.9 03/31/2018 2138   HCT 45.5 03/31/2018 2138   PLT 226 03/31/2018 2138   MCV 92.5 03/31/2018 2138   MCH 30.3 03/31/2018 2138   MCHC 32.7 03/31/2018 2138   RDW 12.9 03/31/2018 2138   LYMPHSABS 2.0 03/31/2018 2138   MONOABS 0.4 03/31/2018 2138   EOSABS 0.2 03/31/2018 2138   BASOSABS 0.0 03/31/2018 2138    No results found for: POCLITH, LITHIUM   No results found for: PHENYTOIN, PHENOBARB, VALPROATE, CBMZ   .res Assessment: Plan:   Will continue current plan of care since target signs and symptoms are well controlled without any tolerability issues. Pt to follow-up in 6 months or sooner if clinically indicated.  Patient advised to contact office with any questions, adverse effects, or acute worsening in signs and symptoms.  Devonta was  seen today for follow-up.  Diagnoses and all orders for this visit:  Insomnia due to other mental disorder -     ALPRAZolam (XANAX) 1 MG tablet; Take 1 tablet (1 mg total) by mouth 2 (two) times daily as needed. -     traZODone (DESYREL) 100 MG tablet; TAKE 1/2 TO ONE TABLET BY MOUTH EVERY NIGHT AT BEDTIME AS NEEDED FOR INSOMNIA  Mixed obsessional thoughts and acts -  ALPRAZolam (XANAX) 1 MG tablet; Take 1 tablet (1 mg total) by mouth 2 (two) times daily as needed. -     lamoTRIgine (LAMICTAL) 200 MG tablet; Take 1 tablet (200 mg total) by mouth daily. -     sertraline (ZOLOFT) 100 MG tablet; Take 2 tablets (200 mg total) by mouth daily.  PTSD (post-traumatic stress disorder) -     ALPRAZolam (XANAX) 1 MG tablet; Take 1 tablet (1 mg total) by mouth 2 (two) times daily as needed. -     sertraline (ZOLOFT) 100 MG tablet; Take 2 tablets (200 mg total) by mouth daily.     Please see After Visit Summary for patient specific instructions.  Future Appointments  Date Time Provider Department Center  09/14/2020 11:00 AM Corie Chiquito, PMHNP [facility] None    No orders of the defined types were placed in this encounter.   -------------------------------

## 2020-04-14 IMAGING — MR MR HEAD W/O CM
10 of 11 series · 43 of 48 positions shown · non-contrast
Comparison: None.

CLINICAL DATA: 37 y/o M; dizziness and headache for 1 week. Speech
difficulty.

EXAM:
MRI HEAD WITHOUT CONTRAST
TECHNIQUE: Multiplanar, multiecho pulse sequences of the brain and surrounding
structures were obtained without intravenous contrast.

[Series 5: DWI · axial · 3.0mm · 0.88mm/px · z∈[-52,+95]mm · 10 of 100 slices shown (1 of 4)]
[im 1/100]
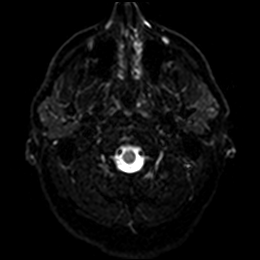
[im 12/100]
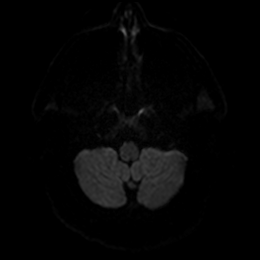
[im 23/100]
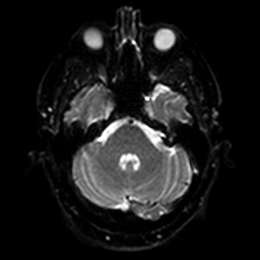
[im 34/100]
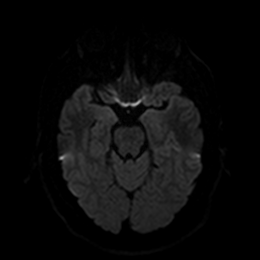
[im 45/100]
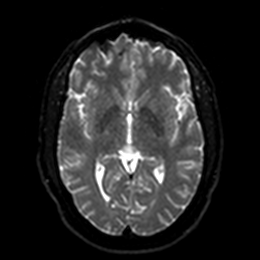
[im 56/100]
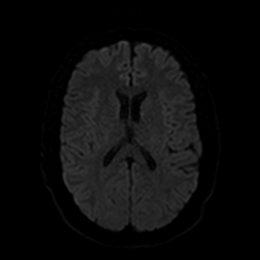
[im 67/100]
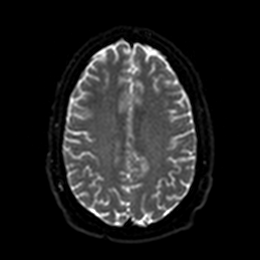
[im 78/100]
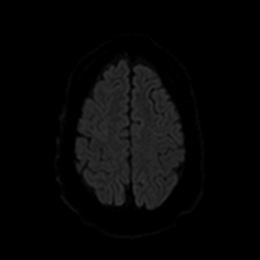
[im 89/100]
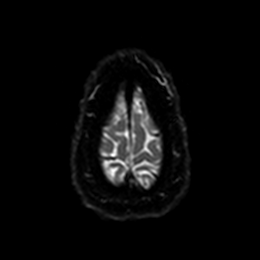
[im 100/100]
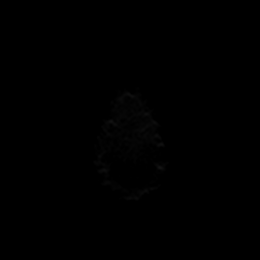

[Series 6: DWI · axial · 3.0mm · 0.88mm/px · z∈[-52,+95]mm · 4 of 50 slices shown (2 of 4)]
[im 1/50]
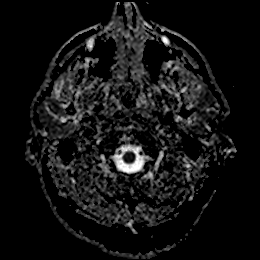
[im 17/50]
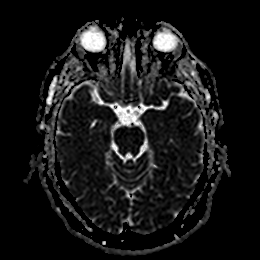
[im 33/50]
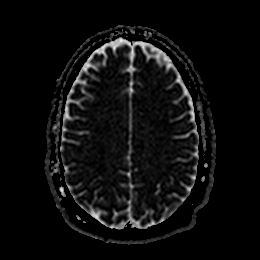
[im 50/50]
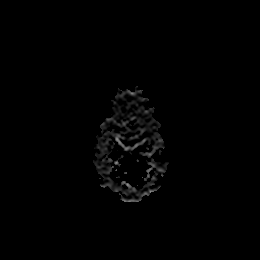

[Series 7: DWI · coronal · 4.0mm · 0.88mm/px · 7 of 76 slices shown (3 of 4)]
[im 1/76]
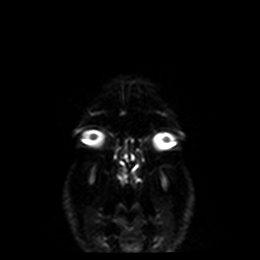
[im 13/76]
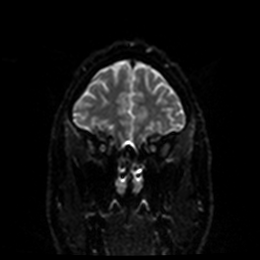
[im 26/76]
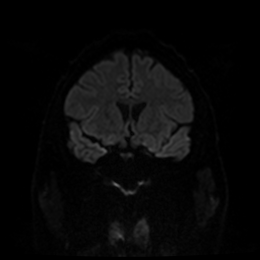
[im 38/76]
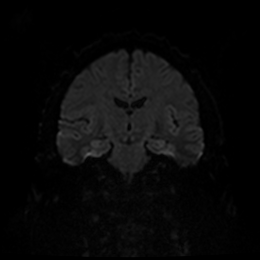
[im 51/76]
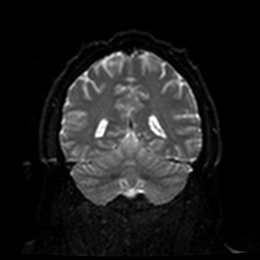
[im 63/76]
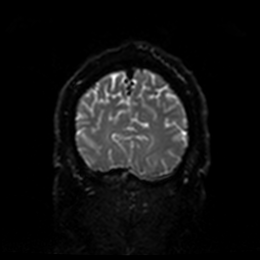
[im 76/76]
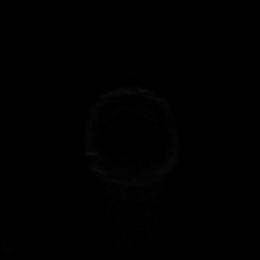

[Series 8: DWI · coronal · 4.0mm · 0.88mm/px · 3 of 38 slices shown (4 of 4)]
[im 1/38]
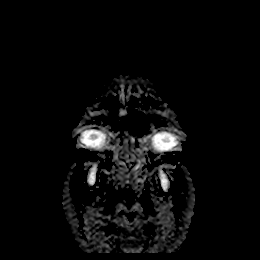
[im 19/38]
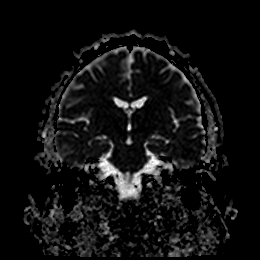
[im 38/38]
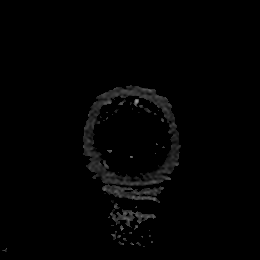

[Series 9: T1 · sagittal · 5.0mm · 0.75mm/px · 2 of 23 slices shown]
[im 1/23]
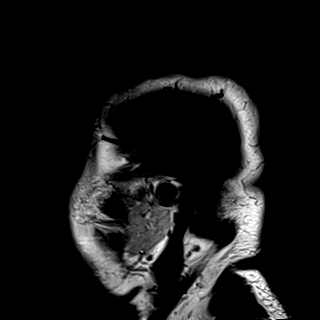
[im 23/23]
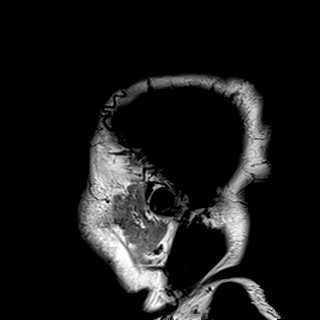

[Series 10: T2 · axial · 5.0mm · 0.72mm/px · z∈[-59,+90]mm · 2 of 26 slices shown (1 of 2)]
[im 1/26]
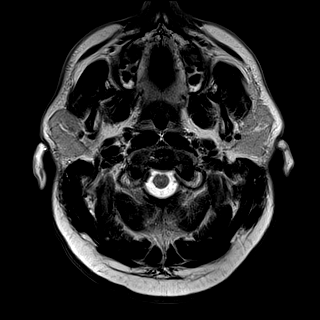
[im 26/26]
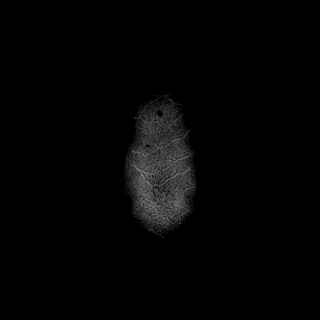

[Series 11: FLAIR · axial · 5.0mm · 0.45mm/px · z∈[-60,+89]mm · 2 of 26 slices shown]
[im 1/26]
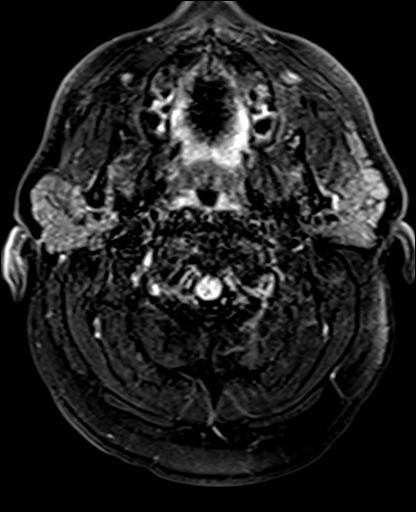
[im 26/26]
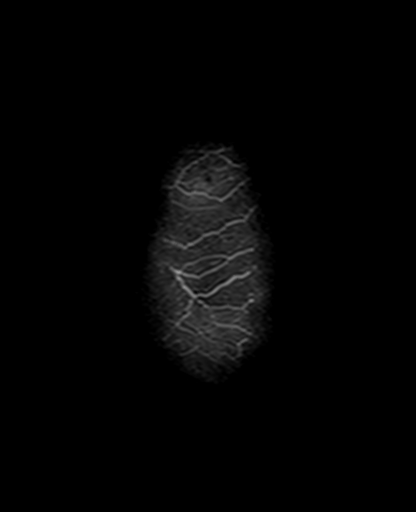

[Series 13: pha_images · axial · 3.0mm · 0.90mm/px · z∈[-74,+94]mm · 5 of 57 slices shown]
[im 1/57]
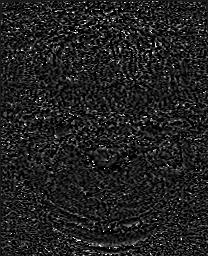
[im 15/57]
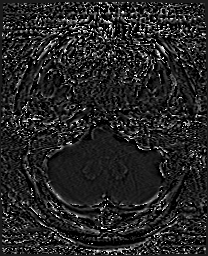
[im 29/57]
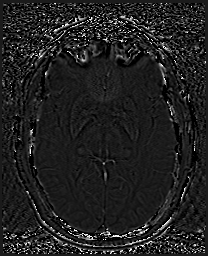
[im 43/57]
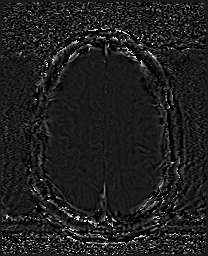
[im 57/57]
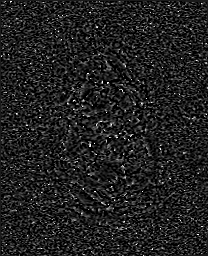

[Series 14: swi_images · axial · 3.0mm · 0.90mm/px · z∈[-74,+103]mm · 5 of 60 slices shown]
[im 1/60]
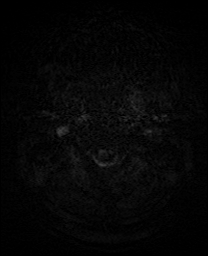
[im 15/60]
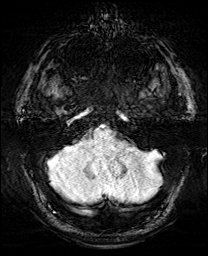
[im 30/60]
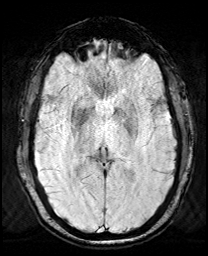
[im 45/60]
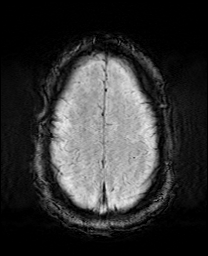
[im 60/60]
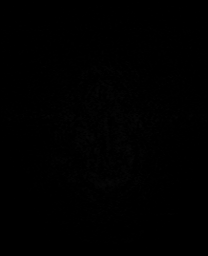

[Series 17: T2 · coronal · 5.0mm · 0.34mm/px · 3 of 31 slices shown (2 of 2)]
[im 1/31]
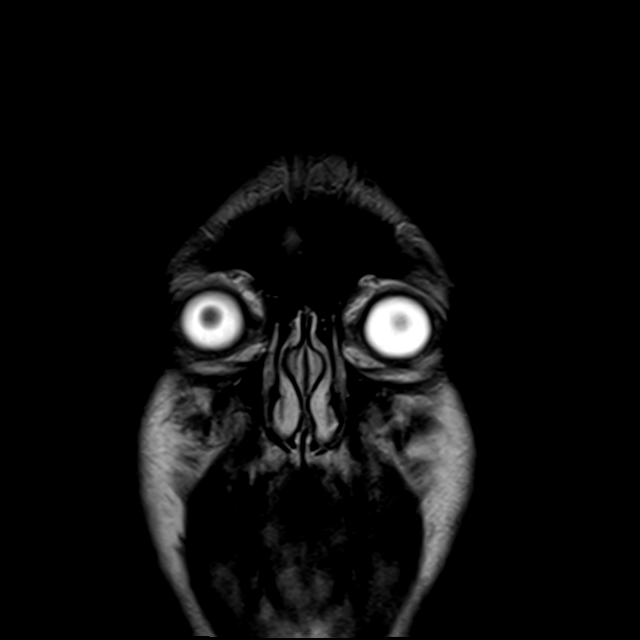
[im 16/31]
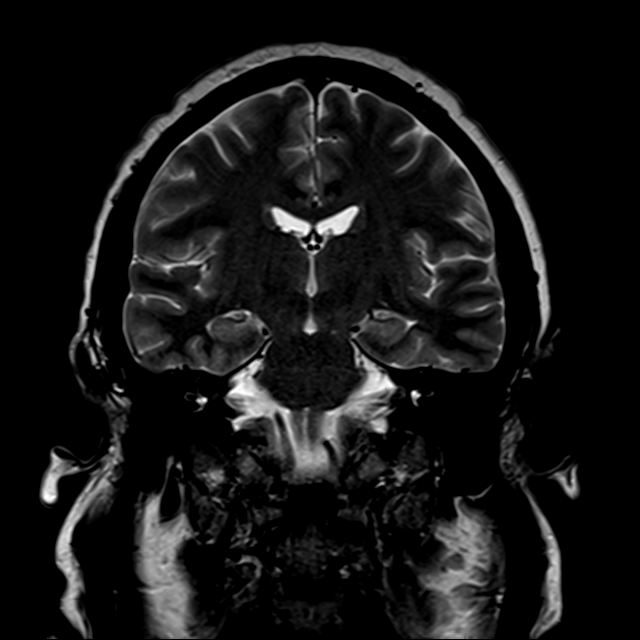
[im 31/31]
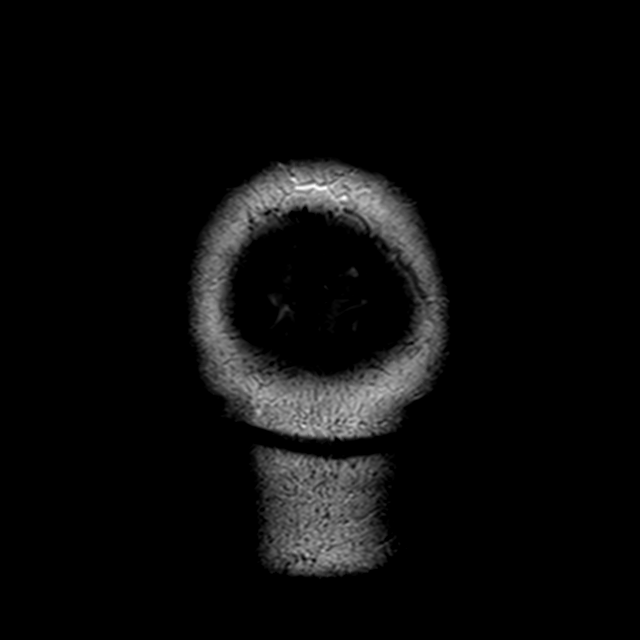

[43 of 48 positions shown; findings below may reference images not displayed]

FINDINGS: Brain: No acute infarction, hemorrhage, hydrocephalus, extra-axial
collection or mass lesion. No significant structural or signal
abnormality of the brain identified.

Vascular: Normal flow voids.

Skull and upper cervical spine: Normal marrow signal.

Sinuses/Orbits: Negative.

Other: None.
IMPRESSION: Negative MRI of the head.

## 2020-09-14 ENCOUNTER — Ambulatory Visit: Payer: BC Managed Care – PPO | Admitting: Psychiatry

## 2020-10-08 ENCOUNTER — Telehealth (INDEPENDENT_AMBULATORY_CARE_PROVIDER_SITE_OTHER): Payer: BC Managed Care – PPO | Admitting: Psychiatry

## 2020-10-08 ENCOUNTER — Encounter: Payer: Self-pay | Admitting: Psychiatry

## 2020-10-08 DIAGNOSIS — F99 Mental disorder, not otherwise specified: Secondary | ICD-10-CM

## 2020-10-08 DIAGNOSIS — F422 Mixed obsessional thoughts and acts: Secondary | ICD-10-CM

## 2020-10-08 DIAGNOSIS — F4310 Post-traumatic stress disorder, unspecified: Secondary | ICD-10-CM

## 2020-10-08 DIAGNOSIS — F5105 Insomnia due to other mental disorder: Secondary | ICD-10-CM

## 2020-10-08 MED ORDER — SERTRALINE HCL 100 MG PO TABS
200.0000 mg | ORAL_TABLET | Freq: Every day | ORAL | 1 refills | Status: DC
Start: 2020-10-08 — End: 2021-06-10

## 2020-10-08 MED ORDER — LAMOTRIGINE 200 MG PO TABS: 200.0000 mg | ORAL_TABLET | Freq: Every day | ORAL | 1 refills | Status: DC

## 2020-10-08 MED ORDER — ALPRAZOLAM 1 MG PO TABS: 1.0000 mg | ORAL_TABLET | Freq: Two times a day (BID) | ORAL | 3 refills | Status: DC | PRN

## 2020-10-08 MED ORDER — TRAZODONE HCL 100 MG PO TABS
ORAL_TABLET | ORAL | 1 refills | Status: DC
Start: 2020-10-08 — End: 2021-08-20

## 2020-10-08 NOTE — Progress Notes (Signed)
Todd Tucker [id number] 1980/10/07 40 y.o.  Virtual Visit via Video Note  I connected with pt @ on 10/08/20 at  3:00 PM EDT by a video enabled telemedicine application and verified that I am speaking with the correct person using two identifiers.   I discussed the limitations of evaluation and management by telemedicine and the availability of in person appointments. The patient expressed understanding and agreed to proceed.  I discussed the assessment and treatment plan with the patient. The patient was provided an opportunity to ask questions and all were answered. The patient agreed with the plan and demonstrated an understanding of the instructions.   The patient was advised to call back or seek an in-person evaluation if the symptoms worsen or if the condition fails to improve as anticipated.  I provided 10 minutes of non-face-to-face time during this encounter.  The patient was located at home.  The provider was located at [facility].   Corie Chiquito, PMHNP   Subjective:   Patient ID:  Todd Tucker is a 40 y.o. (DOB [birth year]) male.  Chief Complaint:  Chief Complaint  Patient presents with   Follow-up    H/o anxiety and mood disturbance    HPI Todd Tucker presents for follow-up of anxiety and mood disturbance. He reports, "i've been doing great" and denies any complaints or concerns. Denies depressed mood or anxiety. Denies sleep improved. Appetite has been ok. Energy and motivation have been good. Sleeping well. Denies SI.    Review of Systems:  Review of Systems  Gastrointestinal: Negative.   Musculoskeletal:  Negative for gait problem.  Neurological:  Negative for tremors.  Psychiatric/Behavioral:         Please refer to HPI   Medications: I have reviewed the patient's current medications.  Current Outpatient Medications  Medication Sig Dispense Refill   ALPRAZolam (XANAX) 1 MG tablet Take 1 tablet (1 mg total) by mouth 2 (two) times daily as needed.  60 tablet 3   lamoTRIgine (LAMICTAL) 200 MG tablet Take 1 tablet (200 mg total) by mouth daily. 90 tablet 1   Nutritional Supplements (JUICE PLUS FIBRE PO) Take by mouth.     sertraline (ZOLOFT) 100 MG tablet Take 2 tablets (200 mg total) by mouth daily. 180 tablet 1   traZODone (DESYREL) 100 MG tablet TAKE 1/2 TO ONE TABLET BY MOUTH EVERY NIGHT AT BEDTIME AS NEEDED FOR INSOMNIA 90 tablet 1   No current facility-administered medications for this visit.    Medication Side Effects: None  Allergies: No Known Allergies  Past Medical History:  Diagnosis Date   Depression    OCD (obsessive compulsive disorder)    Sleep apnea     Family History  Problem Relation Age of Onset   Anxiety disorder Mother    CVA Father    ADD / ADHD Sister     Social History   Socioeconomic History   Marital status: Married    Spouse name: Not on file   Number of children: Not on file   Years of education: Not on file   Highest education level: Not on file  Occupational History   Not on file  Tobacco Use   Smoking status: Never   Smokeless tobacco: Never  Vaping Use   Vaping Use: Never used  Substance and Sexual Activity   Alcohol use: Yes    Comment: rarely   Drug use: No   Sexual activity: Not on file  Other Topics Concern   Not on file  Social  History Narrative   Not on file   Social Determinants of Health   Financial Resource Strain: Not on file  Food Insecurity: Not on file  Transportation Needs: Not on file  Physical Activity: Not on file  Stress: Not on file  Social Connections: Not on file  Intimate Partner Violence: Not on file    Past Medical History, Surgical history, Social history, and Family history were reviewed and updated as appropriate.   Please see review of systems for further details on the patient's review from today.   Objective:   Physical Exam:  There were no vitals taken for this visit.  Physical Exam Neurological:     Mental Status: He is alert  and oriented to person, place, and time.     Cranial Nerves: No dysarthria.  Psychiatric:        Attention and Perception: Attention and perception normal.        Mood and Affect: Mood normal.        Speech: Speech normal.        Behavior: Behavior is cooperative.        Thought Content: Thought content normal. Thought content is not paranoid or delusional. Thought content does not include homicidal or suicidal ideation. Thought content does not include homicidal or suicidal plan.        Cognition and Memory: Cognition and memory normal.        Judgment: Judgment normal.     Comments: Insight intact    Lab Review:     Component Value Date/Time   NA 139 03/31/2018 2138   K 3.7 03/31/2018 2138   CL 104 03/31/2018 2138   CO2 23 03/31/2018 2138   GLUCOSE 120 (H) 03/31/2018 2138   BUN 15 03/31/2018 2138   CREATININE 1.03 03/31/2018 2138   CALCIUM 8.7 (L) 03/31/2018 2138   PROT 6.8 03/31/2018 2138   ALBUMIN 3.8 03/31/2018 2138   AST 21 03/31/2018 2138   ALT 15 03/31/2018 2138   ALKPHOS 76 03/31/2018 2138   BILITOT 0.8 03/31/2018 2138   GFRNONAA >60 03/31/2018 2138   GFRAA >60 03/31/2018 2138       Component Value Date/Time   WBC 6.5 03/31/2018 2138   RBC 4.92 03/31/2018 2138   HGB 14.9 03/31/2018 2138   HCT 45.5 03/31/2018 2138   PLT 226 03/31/2018 2138   MCV 92.5 03/31/2018 2138   MCH 30.3 03/31/2018 2138   MCHC 32.7 03/31/2018 2138   RDW 12.9 03/31/2018 2138   LYMPHSABS 2.0 03/31/2018 2138   MONOABS 0.4 03/31/2018 2138   EOSABS 0.2 03/31/2018 2138   BASOSABS 0.0 03/31/2018 2138    No results found for: POCLITH, LITHIUM   No results found for: PHENYTOIN, PHENOBARB, VALPROATE, CBMZ   .res Assessment: Plan:   Will continue current plan of care since target signs and symptoms are well controlled without any tolerability issues. Continue Sertraline 200 mg po qd for mood and anxiety. Continue Lamictal 200 mg po qd for mood s/s.  Continue Xanax 1 mg po BID prn  anxiety. Continue Trazodone 100 mg 1/2-1 tab po QHS prn insomnia.  Pt to follow-up in 6 months or sooner if clinically indicated. Patient advised to contact office with any questions, adverse effects, or acute worsening in signs and symptoms.  Todd Tucker was seen today for follow-up.  Diagnoses and all orders for this visit:  Mixed obsessional thoughts and acts -     lamoTRIgine (LAMICTAL) 200 MG tablet; Take 1 tablet (200 mg total)  by mouth daily. -     sertraline (ZOLOFT) 100 MG tablet; Take 2 tablets (200 mg total) by mouth daily. -     ALPRAZolam (XANAX) 1 MG tablet; Take 1 tablet (1 mg total) by mouth 2 (two) times daily as needed.  PTSD (post-traumatic stress disorder) -     sertraline (ZOLOFT) 100 MG tablet; Take 2 tablets (200 mg total) by mouth daily. -     ALPRAZolam (XANAX) 1 MG tablet; Take 1 tablet (1 mg total) by mouth 2 (two) times daily as needed.  Insomnia due to other mental disorder -     traZODone (DESYREL) 100 MG tablet; TAKE 1/2 TO ONE TABLET BY MOUTH EVERY NIGHT AT BEDTIME AS NEEDED FOR INSOMNIA -     ALPRAZolam (XANAX) 1 MG tablet; Take 1 tablet (1 mg total) by mouth 2 (two) times daily as needed.    Please see After Visit Summary for patient specific instructions.  No future appointments.  No orders of the defined types were placed in this encounter.     -------------------------------

## 2021-06-04 ENCOUNTER — Other Ambulatory Visit: Payer: Self-pay | Admitting: Psychiatry

## 2021-06-04 DIAGNOSIS — F422 Mixed obsessional thoughts and acts: Secondary | ICD-10-CM

## 2021-06-04 DIAGNOSIS — F4310 Post-traumatic stress disorder, unspecified: Secondary | ICD-10-CM

## 2021-06-07 NOTE — Telephone Encounter (Signed)
Please schedule for F/U. Last seen 8/22 with RTC in 6 months.  ?

## 2021-06-09 NOTE — Telephone Encounter (Signed)
Lvm for pt to call and schedule

## 2021-06-09 NOTE — Telephone Encounter (Signed)
Please schedule for F/U. Last seen 8/22 with RTC in 6 months.  ?

## 2021-06-10 NOTE — Telephone Encounter (Signed)
Lvm at that number you requested to schedule ?

## 2021-06-10 NOTE — Telephone Encounter (Signed)
Please call patient at this # to schedule. [phone] ?

## 2021-08-20 ENCOUNTER — Telehealth (INDEPENDENT_AMBULATORY_CARE_PROVIDER_SITE_OTHER): Payer: BC Managed Care – PPO | Admitting: Psychiatry

## 2021-08-20 ENCOUNTER — Encounter: Payer: Self-pay | Admitting: Psychiatry

## 2021-08-20 DIAGNOSIS — F99 Mental disorder, not otherwise specified: Secondary | ICD-10-CM | POA: Diagnosis not present

## 2021-08-20 DIAGNOSIS — F422 Mixed obsessional thoughts and acts: Secondary | ICD-10-CM | POA: Diagnosis not present

## 2021-08-20 DIAGNOSIS — F5105 Insomnia due to other mental disorder: Secondary | ICD-10-CM | POA: Diagnosis not present

## 2021-08-20 DIAGNOSIS — F4310 Post-traumatic stress disorder, unspecified: Secondary | ICD-10-CM | POA: Diagnosis not present

## 2021-08-20 MED ORDER — TRAZODONE HCL 100 MG PO TABS: ORAL_TABLET | ORAL | 1 refills | Status: DC

## 2021-08-20 MED ORDER — SERTRALINE HCL 100 MG PO TABS: ORAL_TABLET | ORAL | 1 refills | Status: DC

## 2021-08-20 MED ORDER — LAMOTRIGINE 100 MG PO TABS: ORAL_TABLET | ORAL | 0 refills | Status: DC

## 2021-08-20 MED ORDER — LAMOTRIGINE 25 MG PO TABS: ORAL_TABLET | ORAL | 0 refills | Status: DC

## 2021-08-20 MED ORDER — ALPRAZOLAM 1 MG PO TABS: 1.0000 mg | ORAL_TABLET | Freq: Two times a day (BID) | ORAL | 3 refills | Status: DC | PRN

## 2021-08-20 MED ORDER — LAMOTRIGINE 200 MG PO TABS: 200.0000 mg | ORAL_TABLET | Freq: Every day | ORAL | 1 refills | Status: DC

## 2021-08-20 NOTE — Progress Notes (Signed)
Todd Tucker [id number] 1980/03/04 41 y.o.  Virtual Visit via Video Note  I connected with pt @ Tucker 08/20/21 at 11:30 AM EDT by a video enabled telemedicine application and verified that I am speaking with the correct person using two identifiers.   I discussed the limitations of evaluation and management by telemedicine and the availability of in person appointments. The patient expressed understanding and agreed to proceed.  I discussed the assessment and treatment plan with the patient. The patient was provided an opportunity to ask questions and all were answered. The patient agreed with the plan and demonstrated an understanding of the instructions.   The patient was advised to call back or seek an in-person evaluation if the symptoms worsen or if the condition fails to improve as anticipated.  I provided 25 minutes of non-face-to-face time during this encounter.  The patient was located at home.  The provider was located at [facility].   Todd Tucker, PMHNP   Subjective:   Patient ID:  Todd Tucker is a 41 y.o. (DOB 26-Apr-1980) male.  Chief Complaint:  Chief Complaint  Patient presents with   Anxiety   Depression   Follow-up    Insomnia    HPI Todd Tucker presents for follow-up of anxiety, mood disturbance, and insomnia. He reports that he took himself off medication because he had been doing well "and I think it was the worst thing I have ever done." He reports that he has been off medication for about 3 months and symptoms have gradually worsened. He has had  recurrence of anxiety and depression symptoms- "just like before." He has been having panic attacks again several times a week. Some intrusive thoughts. Denies impulsivity or excessive spending. He reports mood lability and irritability. He reports that he was not having panic attacks while taking medication. Energy and motivation have been somewhat lower. Concentration has been ok. He reports sleeping an  adequate amount. Appetite has been ok. Denies SI.   He reports that he was not regularly taking Xanax prn because Trazodone and cPap were helpful for sleep.   He reports that he was tolerating medications without side effects.   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 He is coaching full-time. He returns to work July 10th.   He has been seeing a therapist through [facility].   Taking Xanax prn about once a day now and was taking it sporadically before. Xanax last filled 01/22/21. He reports that he has Xanax tabs remaining.   Review of Systems:  Review of Systems  Cardiovascular:  Negative for palpitations.  Musculoskeletal:  Negative for gait problem.  Skin:  Negative for rash.  Neurological:  Negative for tremors.  Psychiatric/Behavioral:         Please refer to HPI    Medications: I have reviewed the patient's current medications.  Current Outpatient Medications  Medication Sig Dispense Refill   lamoTRIgine (LAMICTAL) 100 MG tablet Take 1 tab po qd x 2 weeks, then increase to 1.5 tabs po qd 45 tablet 0   lamoTRIgine (LAMICTAL) 25 MG tablet Take 1 tablet (25 mg total) by mouth daily for 14 days, THEN 2 tablets (50 mg total) daily for 14 days. 45 tablet 0   ALPRAZolam (XANAX) 1 MG tablet Take 1 tablet (1 mg total) by mouth 2 (two) times daily as needed. 60 tablet 3   lamoTRIgine (LAMICTAL) 200 MG tablet Take 1 tablet (200 mg total)  by mouth daily. 90 tablet 1   Nutritional Supplements (JUICE PLUS FIBRE PO) Take by mouth.     sertraline (ZOLOFT) 100 MG tablet Take 1/2 tablet daily for one week, then 1 tablet daily for one week, then 1.5 tablets daily for one week, then 2 tablets daily 180 tablet 1   traZODone (DESYREL) 100 MG tablet TAKE 1/2 TO ONE TABLET BY MOUTH EVERY NIGHT AT BEDTIME AS NEEDED FOR INSOMNIA 90 tablet 1   No current facility-administered medications for this visit.    Medication Side Effects:  None  Allergies: No Known Allergies  Past Medical History:  Diagnosis Date   Depression    OCD (obsessive compulsive disorder)    Sleep apnea     Family History  Problem Relation Age of Onset   Anxiety disorder Mother    CVA Father    ADD / ADHD Sister     Social History   Socioeconomic History   Marital status: Married    Spouse name: Not Tucker file   Number of children: Not Tucker file   Years of education: Not Tucker file   Highest education level: Not Tucker file  Occupational History   Not Tucker file  Tobacco Use   Smoking status: Never   Smokeless tobacco: Never  Vaping Use   Vaping Use: Never used  Substance and Sexual Activity   Alcohol use: Yes    Comment: rarely   Drug use: No   Sexual activity: Not Tucker file  Other Topics Concern   Not Tucker file  Social History Narrative   Not Tucker file   Social Determinants of Health   Financial Resource Strain: Not Tucker file  Food Insecurity: Not Tucker file  Transportation Needs: Not Tucker file  Physical Activity: Not Tucker file  Stress: Not Tucker file  Social Connections: Not Tucker file  Intimate Partner Violence: Not Tucker file    Past Medical History, Surgical history, Social history, and Family history were reviewed and updated as appropriate.   Please see review of systems for further details Tucker the patient's review from today.   Objective:   Physical Exam:  There were no vitals taken for this visit.  Physical Exam Neurological:     Mental Status: He is alert and oriented to person, place, and time.     Cranial Nerves: No dysarthria.  Psychiatric:        Attention and Perception: Attention and perception normal.        Mood and Affect: Mood is anxious and depressed.        Speech: Speech normal.        Behavior: Behavior is cooperative.        Thought Content: Thought content normal. Thought content is not paranoid or delusional. Thought content does not include homicidal or suicidal ideation. Thought content does not include homicidal  or suicidal plan.        Cognition and Memory: Cognition and memory normal.        Judgment: Judgment normal.     Comments: Insight intact     Lab Review:     Component Value Date/Time   NA 139 03/31/2018 2138   K 3.7 03/31/2018 2138   CL 104 03/31/2018 2138   CO2 23 03/31/2018 2138   GLUCOSE 120 (H) 03/31/2018 2138   BUN 15 03/31/2018 2138   CREATININE 1.03 03/31/2018 2138   CALCIUM 8.7 (L) 03/31/2018 2138   PROT 6.8 03/31/2018 2138   ALBUMIN 3.8 03/31/2018 2138  AST 21 03/31/2018 2138   ALT 15 03/31/2018 2138   ALKPHOS 76 03/31/2018 2138   BILITOT 0.8 03/31/2018 2138   GFRNONAA >60 03/31/2018 2138   GFRAA >60 03/31/2018 2138       Component Value Date/Time   WBC 6.5 03/31/2018 2138   RBC 4.92 03/31/2018 2138   HGB 14.9 03/31/2018 2138   HCT 45.5 03/31/2018 2138   PLT 226 03/31/2018 2138   MCV 92.5 03/31/2018 2138   MCH 30.3 03/31/2018 2138   MCHC 32.7 03/31/2018 2138   RDW 12.9 03/31/2018 2138   LYMPHSABS 2.0 03/31/2018 2138   MONOABS 0.4 03/31/2018 2138   EOSABS 0.2 03/31/2018 2138   BASOSABS 0.0 03/31/2018 2138    No results found for: "POCLITH", "LITHIUM"   No results found for: "PHENYTOIN", "PHENOBARB", "VALPROATE", "CBMZ"   .res Assessment: Plan:    Pt seen for 25 minutes and time spent discussing resuming previous medications since he reports that medications were effective and well-tolerated. Discussed the need to gradually re-start Lamictal to minimize risk of rash/Stevens Johnson syndrome since re-starting at higher dose could cause Andria Meuse Johnsons syndrome. Will start Lamictal 25 mg daily for 2 weeks, then increase to 50 mg daily for 2 weeks, then 100 mg daily for 2 weeks, then 150 mg daily for mood symptoms.  Discussed re-starting Sertraline gradually to minimize side effects and that he may experience some headache and GI side effects the first few days. Discussed that he could slow titration if needed due to side effects and advised the he contact  office if he has difficulties with titration. Will start Sertraline 50 mg daily for one week, then 100 mg daily for one week, then 150 mg daily for one week, then 200 mg daily for anxiety and depression.  Will re-start Trazodone 100 mg 1/2-1 tablet po QHS prn insomnia.  Will continue Xanax 1 mg BID prn anxiety.  He reports that he is leaving today for vacation in [place]. Will sent Lamictal 25 mg script, Sertraline, and Trazodone to [facility] in [place], [place]. He reports that he has Xanax remaining and does not need Xanax filled before or during vacation. Will send Xanax script to his home pharmacy [facility] in [place]) as well as Lamictal scripts for 100 mg for the next part of the titration and maintenance dose of 200 mg.  Recommend continuing psychotherapy.  He reports that he would like to follow-up in office Tucker 09/21/21 as previously scheduled.  Patient advised to contact office with any questions, adverse effects, or acute worsening in signs and symptoms.    Todd Tucker was seen today for anxiety, depression and follow-up.  Diagnoses and all orders for this visit:  Mixed obsessional thoughts and acts -     lamoTRIgine (LAMICTAL) 25 MG tablet; Take 1 tablet (25 mg total) by mouth daily for 14 days, THEN 2 tablets (50 mg total) daily for 14 days. -     lamoTRIgine (LAMICTAL) 100 MG tablet; Take 1 tab po qd x 2 weeks, then increase to 1.5 tabs po qd -     lamoTRIgine (LAMICTAL) 200 MG tablet; Take 1 tablet (200 mg total) by mouth daily. -     sertraline (ZOLOFT) 100 MG tablet; Take 1/2 tablet daily for one week, then 1 tablet daily for one week, then 1.5 tablets daily for one week, then 2 tablets daily -     ALPRAZolam (XANAX) 1 MG tablet; Take 1 tablet (1 mg total) by mouth 2 (two) times daily as  needed.  PTSD (post-traumatic stress disorder) -     sertraline (ZOLOFT) 100 MG tablet; Take 1/2 tablet daily for one week, then 1 tablet daily for one week, then 1.5 tablets daily for one week, then 2 tablets  daily -     ALPRAZolam (XANAX) 1 MG tablet; Take 1 tablet (1 mg total) by mouth 2 (two) times daily as needed.  Insomnia due to other mental disorder -     traZODone (DESYREL) 100 MG tablet; TAKE 1/2 TO ONE TABLET BY MOUTH EVERY NIGHT AT BEDTIME AS NEEDED FOR INSOMNIA -     ALPRAZolam (XANAX) 1 MG tablet; Take 1 tablet (1 mg total) by mouth 2 (two) times daily as needed.     Please see After Visit Summary for patient specific instructions.  Future Appointments  Date Time Provider Department Center  09/21/2021  4:00 PM Todd Tucker, PMHNP [facility] None    No orders of the defined types were placed in this encounter.     -------------------------------

## 2021-09-21 ENCOUNTER — Ambulatory Visit: Payer: BC Managed Care – PPO | Admitting: Psychiatry

## 2022-02-22 ENCOUNTER — Other Ambulatory Visit: Payer: Self-pay | Admitting: Psychiatry

## 2022-02-22 DIAGNOSIS — F4310 Post-traumatic stress disorder, unspecified: Secondary | ICD-10-CM

## 2022-02-22 DIAGNOSIS — F5105 Insomnia due to other mental disorder: Secondary | ICD-10-CM

## 2022-02-22 DIAGNOSIS — F422 Mixed obsessional thoughts and acts: Secondary | ICD-10-CM

## 2022-02-22 NOTE — Telephone Encounter (Signed)
Please call to schedule. Last seen in June, had appt in August but he canceled.

## 2022-02-23 NOTE — Telephone Encounter (Signed)
Lvm for patient to call and schedule 

## 2022-02-24 NOTE — Telephone Encounter (Signed)
Todd Tucker is scheduled for 03/16/22.  He is leaving town tomorrow and asked if the prescription for [name] could be sent in today so he can pick it up today to have for his trip.

## 2022-02-24 NOTE — Telephone Encounter (Signed)
Filled 11/11 appt 1/24

## 2022-03-16 ENCOUNTER — Ambulatory Visit: Payer: Self-pay | Admitting: Psychiatry

## 2022-03-25 ENCOUNTER — Telehealth (INDEPENDENT_AMBULATORY_CARE_PROVIDER_SITE_OTHER): Payer: Self-pay | Admitting: Psychiatry

## 2022-03-25 DIAGNOSIS — F4310 Post-traumatic stress disorder, unspecified: Secondary | ICD-10-CM

## 2022-03-25 DIAGNOSIS — F422 Mixed obsessional thoughts and acts: Secondary | ICD-10-CM

## 2022-03-25 DIAGNOSIS — F5105 Insomnia due to other mental disorder: Secondary | ICD-10-CM

## 2022-03-25 DIAGNOSIS — F99 Mental disorder, not otherwise specified: Secondary | ICD-10-CM

## 2022-03-25 MED ORDER — LAMOTRIGINE 200 MG PO TABS: 200.0000 mg | ORAL_TABLET | Freq: Every day | ORAL | 0 refills | Status: DC

## 2022-03-25 MED ORDER — SERTRALINE HCL 100 MG PO TABS: 200.0000 mg | ORAL_TABLET | Freq: Every day | ORAL | 0 refills | Status: DC

## 2022-03-25 MED ORDER — TRAZODONE HCL 100 MG PO TABS: ORAL_TABLET | ORAL | 0 refills | Status: DC

## 2022-03-25 MED ORDER — ALPRAZOLAM 1 MG PO TABS: 1.0000 mg | ORAL_TABLET | Freq: Two times a day (BID) | ORAL | 0 refills | Status: DC | PRN

## 2022-03-25 NOTE — Progress Notes (Signed)
Todd Tucker [id number] 1980-04-13 42 y.o.  Attempted to reach pt by telephone when pt did not sign into video visit. Pt's wife answered and reports that patient's good friend and co-worker has been in the hospital and [name] likely forgot about the appointment today with going to the hospital and filling in for co-worker during an important meeting today. She reports that she will try to reach him to see if he is available to sign in. Discussed that he could re-schedule if unable to log in at this time. She reports that he was needing medication refills. Will send refills on medications until patient is able to re-schedule. Called wife back to verify preferred pharmacy and she reports that she spoke with her husband and that he had called office and left message this morning that he would need to re-schedule.

## 2022-06-10 ENCOUNTER — Other Ambulatory Visit: Payer: Self-pay | Admitting: Psychiatry

## 2022-06-10 DIAGNOSIS — F4310 Post-traumatic stress disorder, unspecified: Secondary | ICD-10-CM

## 2022-06-10 DIAGNOSIS — F5105 Insomnia due to other mental disorder: Secondary | ICD-10-CM

## 2022-06-10 DIAGNOSIS — F422 Mixed obsessional thoughts and acts: Secondary | ICD-10-CM

## 2022-06-10 NOTE — Telephone Encounter (Signed)
Please call to schedule an appt, not seen since 6/23, no showed last appt.

## 2022-06-13 NOTE — Telephone Encounter (Signed)
Lvm for patient to call and schedule 

## 2022-10-15 ENCOUNTER — Other Ambulatory Visit: Payer: Self-pay | Admitting: Psychiatry

## 2022-10-15 DIAGNOSIS — F4310 Post-traumatic stress disorder, unspecified: Secondary | ICD-10-CM

## 2022-10-15 DIAGNOSIS — F422 Mixed obsessional thoughts and acts: Secondary | ICD-10-CM

## 2022-10-15 DIAGNOSIS — F5105 Insomnia due to other mental disorder: Secondary | ICD-10-CM

## 2022-10-31 ENCOUNTER — Telehealth: Payer: Self-pay | Admitting: Psychiatry

## 2022-10-31 DIAGNOSIS — F4310 Post-traumatic stress disorder, unspecified: Secondary | ICD-10-CM

## 2022-10-31 DIAGNOSIS — F422 Mixed obsessional thoughts and acts: Secondary | ICD-10-CM

## 2022-11-01 ENCOUNTER — Other Ambulatory Visit: Payer: Self-pay

## 2022-11-01 DIAGNOSIS — F4310 Post-traumatic stress disorder, unspecified: Secondary | ICD-10-CM

## 2022-11-01 DIAGNOSIS — F5105 Insomnia due to other mental disorder: Secondary | ICD-10-CM

## 2022-11-01 DIAGNOSIS — F422 Mixed obsessional thoughts and acts: Secondary | ICD-10-CM

## 2022-11-01 MED ORDER — ALPRAZOLAM 1 MG PO TABS
1.0000 mg | ORAL_TABLET | Freq: Two times a day (BID) | ORAL | 0 refills | Status: DC | PRN
Start: 2022-11-01 — End: 2022-11-03

## 2022-11-01 NOTE — Telephone Encounter (Signed)
Patient asking for RF. He last filled Zoloft and Lamictal in February for a 90-day supply. Wife says he has nearly a full bottle of Lamictal left, but only 1 Zoloft. She said he has a habit of stopping medication when he feels better. He does have an appt on 9/12.  I don't want to send RF now since I don't know if you need to titrate anything.

## 2022-11-01 NOTE — Telephone Encounter (Signed)
Todd Tucker has made appt for 11/03/22.  He does through need his medication sent in prior to the appt.  He is out.  He said he needed the Zoloft, Xanax and Lamictal  Send to [facility] [phone] - [place], [place] - [address]

## 2022-11-01 NOTE — Telephone Encounter (Signed)
Pt's wife called back at 1:26p.  She said she was incorrect.  It was not the Lamictal; it's the Trazadone that he hasn't been taking because he said he doesn't need it.  She confirmed that the Lamictal bottle is empty and he has been taking that one.  She just called back to correct the previous message.  She said she did not need a call back.  Next appt 9/12

## 2022-11-03 ENCOUNTER — Telehealth (INDEPENDENT_AMBULATORY_CARE_PROVIDER_SITE_OTHER): Payer: Self-pay | Admitting: Psychiatry

## 2022-11-03 ENCOUNTER — Encounter: Payer: Self-pay | Admitting: Psychiatry

## 2022-11-03 DIAGNOSIS — F4310 Post-traumatic stress disorder, unspecified: Secondary | ICD-10-CM

## 2022-11-03 DIAGNOSIS — F422 Mixed obsessional thoughts and acts: Secondary | ICD-10-CM

## 2022-11-03 DIAGNOSIS — F99 Mental disorder, not otherwise specified: Secondary | ICD-10-CM

## 2022-11-03 DIAGNOSIS — F5105 Insomnia due to other mental disorder: Secondary | ICD-10-CM

## 2022-11-03 DIAGNOSIS — F3181 Bipolar II disorder: Secondary | ICD-10-CM

## 2022-11-03 MED ORDER — SERTRALINE HCL 100 MG PO TABS
200.0000 mg | ORAL_TABLET | Freq: Every day | ORAL | 1 refills | Status: DC
Start: 2022-11-03 — End: 2023-12-10

## 2022-11-03 MED ORDER — ALPRAZOLAM 1 MG PO TABS
1.0000 mg | ORAL_TABLET | Freq: Two times a day (BID) | ORAL | 5 refills | Status: DC | PRN
Start: 2022-11-29 — End: 2023-12-04

## 2022-11-03 MED ORDER — LAMOTRIGINE 200 MG PO TABS
200.0000 mg | ORAL_TABLET | Freq: Every day | ORAL | 1 refills | Status: DC
Start: 2022-11-03 — End: 2023-12-10

## 2022-11-03 NOTE — Progress Notes (Signed)
Todd Tucker [id number] Nov 29, 1980 42 y.o.  Virtual Visit via Video Note  I connected with pt @ on 11/03/22 at  9:30 AM EDT by a video enabled telemedicine application and verified that I am speaking with the correct person using two identifiers.   I discussed the limitations of evaluation and management by telemedicine and the availability of in person appointments. The patient expressed understanding and agreed to proceed.  I discussed the assessment and treatment plan with the patient. The patient was provided an opportunity to ask questions and all were answered. The patient agreed with the plan and demonstrated an understanding of the instructions.   The patient was advised to call back or seek an in-person evaluation if the symptoms worsen or if the condition fails to improve as anticipated.  I provided 15 minutes of non-face-to-face time during this encounter.  The patient was located at work in [facility].  The provider was located at [facility].   Corie Chiquito, PMHNP   Subjective:   Patient ID:  Todd Tucker is a 42 y.o. (DOB 07-13-1980) male.  Chief Complaint:  Chief Complaint  Patient presents with   Follow-up    Anxiety, depression, insomnia    HPI Todd Tucker presents for follow-up of anxiety, mood disturbance, and insomnia. He denies anxiety. Denies depressed mood.  He reports that his mood has been stable. Energy and motivation have been good. Concentration has been ok. Sleeping well. Appetite is stable. Denies SI.   Has not needed Trazodone prn recently. Takes Alprazolam 1 mg at bedtime nightly. Has not needed an additional tab prn recently.   Continues to enjoy his job.   Review of Systems:  Review of Systems  Gastrointestinal: Negative.   Musculoskeletal:  Negative for gait problem.  Neurological:  Negative for tremors.  Psychiatric/Behavioral:         Please refer to HPI    Medications: I have reviewed the patient's current medications.  Current  Outpatient Medications  Medication Sig Dispense Refill   Nutritional Supplements (JUICE PLUS FIBRE PO) Take by mouth.     [START ON 11/29/2022] ALPRAZolam (XANAX) 1 MG tablet Take 1 tablet (1 mg total) by mouth 2 (two) times daily as needed. 60 tablet 5   lamoTRIgine (LAMICTAL) 200 MG tablet Take 1 tablet (200 mg total) by mouth daily. 90 tablet 1   sertraline (ZOLOFT) 100 MG tablet Take 2 tablets (200 mg total) by mouth daily. 180 tablet 1   No current facility-administered medications for this visit.    Medication Side Effects: None  Allergies: No Known Allergies  Past Medical History:  Diagnosis Date   Depression    OCD (obsessive compulsive disorder)    Sleep apnea     Family History  Problem Relation Age of Onset   Anxiety disorder Mother    CVA Father    ADD / ADHD Sister     Social History   Socioeconomic History   Marital status: Married    Spouse name: Not on file   Number of children: Not on file   Years of education: Not on file   Highest education level: Not on file  Occupational History   Not on file  Tobacco Use   Smoking status: Never   Smokeless tobacco: Never  Vaping Use   Vaping status: Never Used  Substance and Sexual Activity   Alcohol use: Yes    Comment: rarely   Drug use: No   Sexual activity: Not on file  Other Topics Concern  Not on file  Social History Narrative   Not on file   Social Determinants of Health   Financial Resource Strain: Patient Declined (05/27/2022)   Received from [facility], [facility]   Overall Financial Resource Strain (CARDIA)    Difficulty of Paying Living Expenses: Patient declined  Food Insecurity: Patient Declined (05/27/2022)   Received from [facility], [facility]   Hunger Vital Sign    Worried About Running Out of Food in the Last Year: Patient declined    Ran Out of Food in the Last Year: Patient declined  Transportation Needs: Patient Declined (05/27/2022)   Received from [employer],  [facility]   PRAPARE - Transportation    Lack of Transportation (Medical): Patient declined    Lack of Transportation (Non-Medical): Patient declined  Physical Activity: Not on file  Stress: Not on file  Social Connections: Unknown (05/17/2022)   Received from [facility], [facility]   Social Network    Social Network: Not on file  Intimate Partner Violence: Unknown (05/17/2022)   Received from [facility], [facility]   HITS    Physically Hurt: Not on file    Insult or Talk Down To: Not on file    Threaten Physical Harm: Not on file    Scream or Curse: Not on file    Past Medical History, Surgical history, Social history, and Family history were reviewed and updated as appropriate.   Please see review of systems for further details on the patient's review from today.   Objective:   Physical Exam:  There were no vitals taken for this visit.  Physical Exam Vitals reviewed.  Neurological:     Mental Status: He is alert and oriented to person, place, and time.     Cranial Nerves: No dysarthria.  Psychiatric:        Attention and Perception: Attention and perception normal.        Mood and Affect: Mood normal.        Speech: Speech normal.        Behavior: Behavior is cooperative.        Thought Content: Thought content normal. Thought content is not paranoid or delusional. Thought content does not include homicidal or suicidal ideation. Thought content does not include homicidal or suicidal plan.        Cognition and Memory: Cognition and memory normal.        Judgment: Judgment normal.     Comments: Insight intact     Lab Review:     Component Value Date/Time   NA 139 03/31/2018 2138   K 3.7 03/31/2018 2138   CL 104 03/31/2018 2138   CO2 23 03/31/2018 2138   GLUCOSE 120 (H) 03/31/2018 2138   BUN 15 03/31/2018 2138   CREATININE 1.03 03/31/2018 2138   CALCIUM 8.7 (L) 03/31/2018 2138   PROT 6.8 03/31/2018 2138   ALBUMIN 3.8 03/31/2018 2138   AST 21  03/31/2018 2138   ALT 15 03/31/2018 2138   ALKPHOS 76 03/31/2018 2138   BILITOT 0.8 03/31/2018 2138   GFRNONAA >60 03/31/2018 2138   GFRAA >60 03/31/2018 2138       Component Value Date/Time   WBC 6.5 03/31/2018 2138   RBC 4.92 03/31/2018 2138   HGB 14.9 03/31/2018 2138   HCT 45.5 03/31/2018 2138   PLT 226 03/31/2018 2138   MCV 92.5 03/31/2018 2138   MCH 30.3 03/31/2018 2138   MCHC 32.7 03/31/2018 2138   RDW 12.9 03/31/2018 2138  LYMPHSABS 2.0 03/31/2018 2138   MONOABS 0.4 03/31/2018 2138   EOSABS 0.2 03/31/2018 2138   BASOSABS 0.0 03/31/2018 2138    No results found for: "POCLITH", "LITHIUM"   No results found for: "PHENYTOIN", "PHENOBARB", "VALPROATE", "CBMZ"   .res Assessment: Plan:    Will discontinue Trazodone prn due to non-use.  Continue Lamictal 200 mg daily for mood symptoms.  Continue Sertraline 200 mg daily for anxiety and mood.  Continue Alprazolam 1 mg BID prn anxiety.   Todd Tucker was seen today for follow-up.  Diagnoses and all orders for this visit:  Mixed obsessional thoughts and acts -     ALPRAZolam (XANAX) 1 MG tablet; Take 1 tablet (1 mg total) by mouth 2 (two) times daily as needed. -     lamoTRIgine (LAMICTAL) 200 MG tablet; Take 1 tablet (200 mg total) by mouth daily. -     sertraline (ZOLOFT) 100 MG tablet; Take 2 tablets (200 mg total) by mouth daily.  PTSD (post-traumatic stress disorder) -     ALPRAZolam (XANAX) 1 MG tablet; Take 1 tablet (1 mg total) by mouth 2 (two) times daily as needed. -     sertraline (ZOLOFT) 100 MG tablet; Take 2 tablets (200 mg total) by mouth daily.  Insomnia due to other mental disorder -     ALPRAZolam (XANAX) 1 MG tablet; Take 1 tablet (1 mg total) by mouth 2 (two) times daily as needed.  Bipolar II disorder (HCC) -     lamoTRIgine (LAMICTAL) 200 MG tablet; Take 1 tablet (200 mg total) by mouth daily.     Please see After Visit Summary for patient specific instructions.  No future appointments.  No  orders of the defined types were placed in this encounter.     -------------------------------

## 2022-12-14 ENCOUNTER — Telehealth: Payer: Self-pay | Admitting: Psychiatry

## 2022-12-14 DIAGNOSIS — F3181 Bipolar II disorder: Secondary | ICD-10-CM

## 2022-12-14 NOTE — Telephone Encounter (Signed)
PT lvm that he wants to [place] to call him. His number is [phone]

## 2022-12-14 NOTE — Telephone Encounter (Signed)
Patient is reporting increased irritability, without any new stressors. Reports he has been stagnant for so long that he doesn't know what medication needs to be adjusted. No other complaints.

## 2022-12-14 NOTE — Telephone Encounter (Signed)
Attempted to contact pt to find out more information. L/M that provider would attempt to reach him again tomorrow.

## 2022-12-15 NOTE — Telephone Encounter (Signed)
Attempted to reach pt. No answer.

## 2022-12-16 NOTE — Telephone Encounter (Signed)
Attempted to reach pt. No answer.

## 2022-12-19 NOTE — Telephone Encounter (Signed)
Attempted to reach pt. L/M and requested that he contact office regarding when he may be available and that provider would try to reach him through MyChart as well.

## 2022-12-21 MED ORDER — LURASIDONE HCL 40 MG PO TABS
ORAL_TABLET | ORAL | 0 refills | Status: DC
Start: 2022-12-21 — End: 2023-12-10

## 2022-12-21 NOTE — Addendum Note (Signed)
Addended by: Derenda Mis on: 12/21/2022 03:12 PM   Modules accepted: Orders

## 2023-01-04 ENCOUNTER — Encounter: Payer: Self-pay | Admitting: Psychiatry

## 2023-12-02 ENCOUNTER — Encounter (HOSPITAL_COMMUNITY): Payer: Self-pay

## 2023-12-02 ENCOUNTER — Other Ambulatory Visit: Payer: Self-pay

## 2023-12-02 ENCOUNTER — Emergency Department (HOSPITAL_COMMUNITY)
Admission: EM | Admit: 2023-12-02 | Discharge: 2023-12-02 | Disposition: A | Discharge status: Home / Self Care | Payer: Self-pay

## 2023-12-02 DIAGNOSIS — R4589 Other symptoms and signs involving emotional state: Secondary | ICD-10-CM

## 2023-12-02 DIAGNOSIS — F3181 Bipolar II disorder: Secondary | ICD-10-CM

## 2023-12-02 DIAGNOSIS — F331 Major depressive disorder, recurrent, moderate: Secondary | ICD-10-CM | POA: Insufficient documentation

## 2023-12-02 DIAGNOSIS — R634 Abnormal weight loss: Secondary | ICD-10-CM | POA: Diagnosis not present

## 2023-12-02 DIAGNOSIS — F32A Depression, unspecified: Secondary | ICD-10-CM | POA: Diagnosis present

## 2023-12-02 DIAGNOSIS — Z79899 Other long term (current) drug therapy: Secondary | ICD-10-CM | POA: Diagnosis not present

## 2023-12-02 DIAGNOSIS — F329 Major depressive disorder, single episode, unspecified: Secondary | ICD-10-CM | POA: Diagnosis not present

## 2023-12-02 DIAGNOSIS — F4310 Post-traumatic stress disorder, unspecified: Secondary | ICD-10-CM

## 2023-12-02 LAB — CBC WITH DIFFERENTIAL/PLATELET
Abs Immature Granulocytes: 0.01 K/uL (ref 0.00–0.07)
Basophils Absolute: 0.1 K/uL (ref 0.0–0.1)
Basophils Relative: 1 %
Eosinophils Absolute: 0.1 K/uL (ref 0.0–0.5)
Eosinophils Relative: 2 %
HCT: 46.9 % (ref 39.0–52.0)
Hemoglobin: 15.9 g/dL (ref 13.0–17.0)
Immature Granulocytes: 0 %
Lymphocytes Relative: 25 %
Lymphs Abs: 1.9 K/uL (ref 0.7–4.0)
MCH: 30.2 pg (ref 26.0–34.0)
MCHC: 33.9 g/dL (ref 30.0–36.0)
MCV: 89 fL (ref 80.0–100.0)
Monocytes Absolute: 0.7 K/uL (ref 0.1–1.0)
Monocytes Relative: 9 %
Neutro Abs: 4.8 K/uL (ref 1.7–7.7)
Neutrophils Relative %: 63 %
Platelets: 295 K/uL (ref 150–400)
RBC: 5.27 MIL/uL (ref 4.22–5.81)
RDW: 12.6 % (ref 11.5–15.5)
WBC: 7.5 K/uL (ref 4.0–10.5)
nRBC: 0 % (ref 0.0–0.2)

## 2023-12-02 LAB — BASIC METABOLIC PANEL WITH GFR
Anion gap: 9 (ref 5–15)
BUN: 12 mg/dL (ref 6–20)
CO2: 28 mmol/L (ref 22–32)
Calcium: 9.6 mg/dL (ref 8.9–10.3)
Chloride: 103 mmol/L (ref 98–111)
Creatinine, Ser: 1 mg/dL (ref 0.61–1.24)
GFR, Estimated: >60 mL/min (ref >60)
Glucose, Bld: 104 mg/dL — ABNORMAL HIGH (ref 70–99)
Potassium: 4.2 mmol/L (ref 3.5–5.1)
Sodium: 140 mmol/L (ref 135–145)

## 2023-12-02 LAB — URINE DRUG SCREEN
Amphetamines: NEGATIVE
Barbiturates: NEGATIVE
Benzodiazepines: NEGATIVE
Cocaine: NEGATIVE
Fentanyl: NEGATIVE
Methadone Scn, Ur: NEGATIVE
Opiates: NEGATIVE
Tetrahydrocannabinol: NEGATIVE

## 2023-12-02 LAB — SALICYLATE LEVEL: Salicylate Lvl: <7 mg/dL — ABNORMAL LOW (ref 7.0–30.0)

## 2023-12-02 LAB — ETHANOL: Alcohol, Ethyl (B): <15 mg/dL (ref <15)

## 2023-12-02 LAB — ACETAMINOPHEN LEVEL: Acetaminophen (Tylenol), Serum: <10 ug/mL — ABNORMAL LOW (ref 10–30)

## 2023-12-02 NOTE — Discharge Instructions (Addendum)
 Today you were seen for depressed mood.  Please follow-up with [facility] as soon as possible.  Please return to the ED if you have suicidal ideations, homicidal ideations, or hallucinations.  Thank you for letting us  treat you today. After reviewing your labs, I feel you are safe to go home. Please follow up with your PCP in the next several days and provide them with your records from this visit. Return to the Emergency Room if pain becomes severe or symptoms worsen.

## 2023-12-02 NOTE — ED Triage Notes (Signed)
 Pt states he is here for psych evaluation. No SI/HI. Pt is feeling depressed and wants to start a treatment.

## 2023-12-02 NOTE — Consult Note (Signed)
 [facility] Psychiatric Consult Initial  Patient Name: .Todd Tucker  MRN: [id number]  DOB: 09-22-1980  Consult Order details:  Orders (From admission, onward)     Start     Ordered   12/02/23 1220  CONSULT TO CALL ACT TEAM       Ordering Provider: Francis Ileana SAILOR, PA-C  Provider:  (Not yet assigned)  Question:  Reason for Consult?  Answer:  depression, Hx SI   12/02/23 1220             Mode of Visit: In person    Psychiatry Consult Evaluation  Service Date: December 02, 2023 LOS:  LOS: 0 days  Chief Complaint increased depression  Primary Psychiatric Diagnoses  Major depressive disorder 2.  Bipolar 2 disorder 3.  Posttraumatic stress disorder  Assessment  Todd Tucker is a 43 y.o. male admitted: Presented to the EDfor worsening depression 12/02/2023 10:59 AM  He carries the psychiatric diagnoses of major depressive disorder, bipolar 2 disorder, posttraumatic stress disorder, mixed obsessive thoughts and generalized anxiety disorder and has a past medical history of dizziness and ankle pain.   His current presentation of increased depression is most consistent with diagnoses related to major depressive disorder, generalized anxiety disorder obsessive thoughts. He meets criteria for partial hospitalization outpatient programming based on reported symptoms.  Current outpatient psychotropic medications include Zoloft  200 mg, Xanax  and Lamictal  and historically he has had a fair response to these medications. He was not compliant with medications prior to admission as evidenced by self-reported recently discharged from jail. On initial examination, pleasant calm mood congruent denying suicidal ideations does report a history of self injures behaviors however states  jail saved my life. Please see plan below for detailed recommendations.   Diagnoses:  Active Hospital problems: Principal Problem:   Major depressive disorder, recurrent episode, moderate (HCC)    Plan   ##  Psychiatric Medication Recommendations:  Will make additional outpatient resources available for [facility] urgent care outpatient services for therapy psychiatry - Partial hospitalization programming  ## Medical Decision Making Capacity: Not specifically addressed in this encounter  ## Further Work-up:  --  EKG, U/A, or UDS -- most recent EKG, THS -- Pertinent labwork reviewed earlier this admission includes:    ## Disposition:-- Plan Post Discharge/Psychiatric Care Follow-up resources partial outpatient programming PHP  ## Behavioral / Environmental: - No specific recommendations at this time.     ## Safety and Observation Level:  - Based on my clinical evaluation, I estimate the patient to be at moderate risk of self harm in the current setting. - At this time, we recommend  routine. This decision is based on my review of the chart including patient's history and current presentation, interview of the patient, mental status examination, and consideration of suicide risk including evaluating suicidal ideation, plan, intent, suicidal or self-harm behaviors, risk factors, and protective factors. This judgment is based on our ability to directly address suicide risk, implement suicide prevention strategies, and develop a safety plan while the patient is in the clinical setting. Please contact our team if there is a concern that risk level has changed.  CSSR Risk Category:C-SSRS RISK CATEGORY: Error: Question 6 not populated  Suicide Risk Assessment: Patient has following modifiable risk factors for suicide: untreated depression, under treated depression , and lack of access to outpatient mental health resources, which we are addressing by following up establishing care with [facility] urgent care. Patient has following non-modifiable or demographic risk factors for suicide: male gender  and separation or divorce Patient has the following protective factors against suicide:  Supportive family and Frustration tolerance  Thank you for this consult request. Recommendations have been communicated to the primary team.  We will discharging following up with partial outpatient hospitalization programming through [facility] urgent care at this time.   [name], NP       History of Present Illness  Relevant Aspects of Hospital ED Course:  Per admission assessment note: Todd Tucker is a 43 y.o. male presents today for feeling depressed.  Patient reports he was suicidal and making plans to commit suicide in May then was in jail for several months and has recently been released.  Patient denies suicidal ideation, homicidal ideation, or AVH at this time.  Patient also reports 80 pound weight loss in the last several months.   Patient Report: Todd Tucker 43 year old Caucasian male presents to the emergency department due to worsening depression and passive suicidal ideations.  He reports psychiatric history related to major depressive disorder, generalized anxiety disorder, assessment compulsive disorder, insomnia and posttraumatic stress disorder.  Reports he was medication compliant previously to spending 3 months in jail.  States he was not able to receive all of his medication while incarcerated.  Reports he was previously prescribed Zoloft  200 mg, Lamictal  100 mg and Xanax  as needed.  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  Reports separation/divorce by his wife.  Currently resides with his parents some financial strain.  He reports a history of self injures behavior however he is denying plan or intent.  Patient is seeking to reestablish care since he currently resides in [place].  Todd Tucker reports a history of ongoing ruminations related to false allegations and insomnia.  Todd Tucker denying suicidal or homicidal ideations.  Denies auditory or  visual hallucinations.  This provider spoke to patient's mother Todd Tucker who is awaiting in lobby.  Patient provided verbal authorization to follow-up.  She validated information provided by her son.  She was interviewed independently of her son.  She denied any safety concerns with him returning home.  He is hopeful that he would receive treatment for his depression symptoms.  Deny access to guns or weapons.  He denied previous inpatient admissions.  Denied history of illicit drug use or substance abuse history.  During evaluation Todd Tucker is sitting hallway D; he is alert/oriented x 4; calm/cooperative; and mood congruent with affect.  Patient is speaking in a clear tone at moderate volume, and normal pace; with good eye contact.  His  thought process is coherent and relevant; There is no indication that he is currently responding to internal/external stimuli or experiencing delusional thought content.  Patient denies suicidal/self-harm/homicidal ideation, psychosis, and paranoia.  Patient has remained calm throughout assessment and has answered questions appropriately.   Case staffed with attending psychiatrist Goli, will recommend follow-up with intensive outpatient programming and/or partial hospitalization programming to restart and adjust medications.  And or follow-up with [facility] urgent care for medication management.  Support encouragement reassurance was provided.  Psych ROS:  Depression: Reported history related to major depressive disorder generalized anxiety disorder Anxiety:   Mania (lifetime and current): Denied Psychosis: (lifetime and current): Denied  Collateral information:  Contacted see HPI. Spoke to Mother Trung Wenzl  Review of Systems  Psychiatric/Behavioral:  Positive for depression. Suicidal ideas: passive ideations, no planor intent.The patient is nervous/anxious.  Psychiatric and Social History  Psychiatric History:  Information collected from  mother and patient  Prev Dx/Sx: Major depressive disorder, generalized anxiety disorder disorder and posttraumatic stress disorder Current Psych Provider: NP Franchot [facility] Meds (current): Zoloft  and Lamictal  Previous Med Trials: Latuda  Therapy: Denied  Prior Psych Hospitalization: Denied Prior Self Harm: Reports a history of self injures behavior by cutting when he was younger Prior Violence: Denied  Family Psych History: Denied Family Hx suicide: Denied  Social History:  Developmental Hx:  Educational Hx:  Occupational Hx: Unemployed Sports administrator Hx: Sexual allegations alleged Living Situation: Currently residing with his parents Spiritual Hx:  Access to weapons/lethal means: Denied  Substance History Alcohol: Denied Type of alcohol  Last Drink  Number of drinks per day  History of alcohol withdrawal seizures  History of DT's n/a Tobacco: n/a Illicit drugs: denied Prescription drug abuse: denied Rehab hx:   Exam Findings  Physical Exam:  Vital Signs:  Temp:  [98.5 F (36.9 C)] 98.5 F (36.9 C) (10/11 1107) Pulse Rate:  [91] 91 (10/11 1107) Resp:  [18] 18 (10/11 1107) BP: (127)/(86) 127/86 (10/11 1107) SpO2:  [97 %] 97 % (10/11 1107) Weight:  [103 kg] 103 kg (10/11 1118) Blood pressure 127/86, pulse 91, temperature 98.5 F (36.9 C), temperature source Oral, resp. rate 18, weight 103 kg, SpO2 97%. Body mass index is 34.52 kg/m.  Physical Exam Vitals and nursing note reviewed.  Constitutional:      Appearance: Normal appearance.  Neurological:     Mental Status: He is alert and oriented to person, place, and time.  Psychiatric:        Mood and Affect: Mood normal.        Behavior: Behavior normal.     Mental Status Exam: General Appearance: Casual  Orientation:  Full (Time, Place, and Person)  Memory:  Immediate;   Good Recent;   Good  Concentration:  Concentration: Good  Recall:  Good  Attention  Good  Eye Contact:  Good  Speech:  Clear  and Coherent  Language:  Good  Volume:  Normal  Mood: congruent   Affect:  Congruent  Thought Process:  Coherent  Thought Content:  Logical  Suicidal Thoughts:  Yes.  without intent/plan  Homicidal Thoughts:  No  Judgement:  Fair  Insight:  Good  Psychomotor Activity:  Normal  Akathisia:  No  Fund of Knowledge:  Good      Assets:  Communication Skills Desire for Improvement  Cognition:  WNL  ADL's:  Intact  AIMS (if indicated):        Other History   These have been pulled in through the EMR, reviewed, and updated if appropriate.  Family History:  The patient's family history includes ADD / ADHD in his sister; Anxiety disorder in his mother; CVA in his father.  Medical History: Past Medical History:  Diagnosis Date   Depression    OCD (obsessive compulsive disorder)    Sleep apnea     Surgical History: Past Surgical History:  Procedure Laterality Date   KNEE SURGERY Bilateral    KNEE SURGERY       Medications:  No current facility-administered medications for this encounter.  Current Outpatient Medications:    ALPRAZolam  (XANAX ) 1 MG tablet, Take 1 tablet (1 mg total) by mouth 2 (two) times daily as needed., Disp: 60 tablet, Rfl: 5   lamoTRIgine  (LAMICTAL ) 200 MG tablet, Take 1 tablet (200 mg total) by mouth daily., Disp: 90 tablet, Rfl: 1  lurasidone  (LATUDA ) 40 MG TABS tablet, Take 1/2 tablet daily with supper for one week, then increase to 1 tablet daily with supper, Disp: 30 tablet, Rfl: 0   Nutritional Supplements (JUICE PLUS FIBRE PO), Take by mouth., Disp: , Rfl:    sertraline  (ZOLOFT ) 100 MG tablet, Take 2 tablets (200 mg total) by mouth daily., Disp: 180 tablet, Rfl: 1  Allergies: No Known Allergies  [name], NP

## 2023-12-02 NOTE — ED Provider Notes (Signed)
 Todd Tucker EMERGENCY DEPARTMENT AT [facility] Provider Note   CSN: [id number] Arrival date & time: 12/02/23  1053     Patient presents with: No chief complaint on file.   Todd Tucker is a 43 y.o. male presents today for feeling depressed.  Patient reports he was suicidal and making plans to commit suicide in May then was in jail for several months and has recently been released.  Patient denies suicidal ideation, homicidal ideation, or AVH at this time.  Patient also reports 80 pound weight loss in the last several months.   HPI     Prior to Admission medications   Medication Sig Start Date End Date Taking? Authorizing Provider  ALPRAZolam  (XANAX ) 1 MG tablet Take 1 tablet (1 mg total) by mouth 2 (two) times daily as needed. 11/29/22   Franchot Harlene SQUIBB, PMHNP  lamoTRIgine  (LAMICTAL ) 200 MG tablet Take 1 tablet (200 mg total) by mouth daily. 11/03/22   Franchot Harlene SQUIBB, PMHNP  lurasidone  (LATUDA ) 40 MG TABS tablet Take 1/2 tablet daily with supper for one week, then increase to 1 tablet daily with supper 12/21/22   Franchot Harlene SQUIBB, PMHNP  Nutritional Supplements (JUICE PLUS FIBRE PO) Take by mouth.    [provider]  sertraline  (ZOLOFT ) 100 MG tablet Take 2 tablets (200 mg total) by mouth daily. 11/03/22   Franchot Harlene SQUIBB, PMHNP    Allergies: Patient has no known allergies.    Review of Systems  Psychiatric/Behavioral:  Positive for behavioral problems.     Updated Vital Signs BP 127/86 (BP Location: Right Arm)   Pulse 91   Temp 98.5 F (36.9 C) (Oral)   Resp 18   Wt 103 kg   SpO2 97%   BMI 34.52 kg/m   Physical Exam Vitals and nursing note reviewed.  Constitutional:      General: He is not in acute distress.    Appearance: Normal appearance. He is well-developed. He is not ill-appearing.  HENT:     Head: Normocephalic and atraumatic.     Right Ear: External ear normal.     Left Ear: External ear normal.     Nose: Nose normal.  Eyes:      Conjunctiva/sclera: Conjunctivae normal.  Cardiovascular:     Rate and Rhythm: Normal rate and regular rhythm.     Pulses: Normal pulses.     Heart sounds: Normal heart sounds. No murmur heard. Pulmonary:     Effort: Pulmonary effort is normal. No respiratory distress.     Breath sounds: Normal breath sounds.  Abdominal:     Palpations: Abdomen is soft.     Tenderness: There is no abdominal tenderness.  Musculoskeletal:        General: No swelling.     Cervical back: Neck supple.  Skin:    General: Skin is warm and dry.     Capillary Refill: Capillary refill takes less than 2 seconds.  Neurological:     General: No focal deficit present.     Mental Status: He is alert and oriented to person, place, and time.  Psychiatric:        Attention and Perception: Attention normal.        Mood and Affect: Mood normal.        Speech: Speech normal.        Behavior: Behavior normal. Behavior is cooperative.     (all labs ordered are listed, but only abnormal results are displayed) Labs Reviewed  ACETAMINOPHEN LEVEL -  Abnormal; Notable for the following components:      Result Value   Acetaminophen (Tylenol), Serum <10 (*)    All other components within normal limits  SALICYLATE LEVEL - Abnormal; Notable for the following components:   Salicylate Lvl <7.0 (*)    All other components within normal limits  BASIC METABOLIC PANEL WITH GFR - Abnormal; Notable for the following components:   Glucose, Bld 104 (*)    All other components within normal limits  ETHANOL  URINE DRUG SCREEN  CBC WITH DIFFERENTIAL/PLATELET    EKG: None  Radiology: No results found.   Procedures   Medications Ordered in the ED - No data to display                                  Medical Decision Making Amount and/or Complexity of Data Reviewed Labs: ordered.   This patient presents to the ED for concern of depression differential diagnosis includes depression, SI, HI, AVH  Lab Tests:  I  Ordered, and personally interpreted labs.  The pertinent results include: BMP unremarkable, CBC unremarkable, acetaminophen and salicylate negative, alcohol negative, UDS negative  Problem List / ED Course:  Consulted TTS for further evaluation and to return patient disposition       Final diagnoses:  Depressed mood    ED Discharge Orders     None          Francis Ileana SAILOR, PA-C 12/02/23 1220    Ula Prentice SAUNDERS, MD 12/02/23 1450

## 2023-12-04 ENCOUNTER — Other Ambulatory Visit: Payer: Self-pay

## 2023-12-04 ENCOUNTER — Ambulatory Visit (HOSPITAL_COMMUNITY)
Admission: EM | Admit: 2023-12-04 | Discharge: 2023-12-04 | Disposition: A | Discharge status: Other Acute Inpatient Hospital | Attending: Nurse Practitioner | Admitting: Nurse Practitioner

## 2023-12-04 ENCOUNTER — Inpatient Hospital Stay (HOSPITAL_COMMUNITY)
Admission: AD | Admit: 2023-12-04 | Discharge: 2023-12-10 | DRG: 885 | Disposition: A | Discharge status: Home / Self Care | Source: Intra-hospital

## 2023-12-04 ENCOUNTER — Encounter (HOSPITAL_COMMUNITY): Payer: Self-pay

## 2023-12-04 DIAGNOSIS — F411 Generalized anxiety disorder: Secondary | ICD-10-CM | POA: Diagnosis present

## 2023-12-04 DIAGNOSIS — F332 Major depressive disorder, recurrent severe without psychotic features: Principal | ICD-10-CM | POA: Diagnosis present

## 2023-12-04 DIAGNOSIS — Z79899 Other long term (current) drug therapy: Secondary | ICD-10-CM | POA: Diagnosis not present

## 2023-12-04 DIAGNOSIS — Z9152 Personal history of nonsuicidal self-harm: Secondary | ICD-10-CM

## 2023-12-04 DIAGNOSIS — R45851 Suicidal ideations: Secondary | ICD-10-CM | POA: Diagnosis present

## 2023-12-04 DIAGNOSIS — F6089 Other specific personality disorders: Secondary | ICD-10-CM | POA: Diagnosis present

## 2023-12-04 DIAGNOSIS — F424 Excoriation (skin-picking) disorder: Secondary | ICD-10-CM | POA: Diagnosis present

## 2023-12-04 DIAGNOSIS — Z818 Family history of other mental and behavioral disorders: Secondary | ICD-10-CM

## 2023-12-04 DIAGNOSIS — F339 Major depressive disorder, recurrent, unspecified: Secondary | ICD-10-CM | POA: Insufficient documentation

## 2023-12-04 DIAGNOSIS — F422 Mixed obsessional thoughts and acts: Secondary | ICD-10-CM

## 2023-12-04 DIAGNOSIS — E538 Deficiency of other specified B group vitamins: Secondary | ICD-10-CM | POA: Diagnosis present

## 2023-12-04 DIAGNOSIS — F605 Obsessive-compulsive personality disorder: Secondary | ICD-10-CM

## 2023-12-04 DIAGNOSIS — F3181 Bipolar II disorder: Secondary | ICD-10-CM

## 2023-12-04 LAB — COMPREHENSIVE METABOLIC PANEL WITH GFR
ALT: 15 U/L (ref 0–44)
AST: 15 U/L (ref 15–41)
Albumin: 3.8 g/dL (ref 3.5–5.0)
Alkaline Phosphatase: 87 U/L (ref 38–126)
Anion gap: 7 (ref 5–15)
BUN: 11 mg/dL (ref 6–20)
CO2: 24 mmol/L (ref 22–32)
Calcium: 9.3 mg/dL (ref 8.9–10.3)
Chloride: 102 mmol/L (ref 98–111)
Creatinine, Ser: 0.9 mg/dL (ref 0.61–1.24)
GFR, Estimated: >60 mL/min (ref >60)
Glucose, Bld: 87 mg/dL (ref 70–99)
Potassium: 3.8 mmol/L (ref 3.5–5.1)
Sodium: 133 mmol/L — ABNORMAL LOW (ref 135–145)
Total Bilirubin: 0.6 mg/dL (ref 0.0–1.2)
Total Protein: 7.3 g/dL (ref 6.5–8.1)

## 2023-12-04 LAB — CBC WITH DIFFERENTIAL/PLATELET
Abs Immature Granulocytes: 0.02 K/uL (ref 0.00–0.07)
Basophils Absolute: 0 K/uL (ref 0.0–0.1)
Basophils Relative: 0 %
Eosinophils Absolute: 0.1 K/uL (ref 0.0–0.5)
Eosinophils Relative: 1 %
HCT: 45.4 % (ref 39.0–52.0)
Hemoglobin: 15.7 g/dL (ref 13.0–17.0)
Immature Granulocytes: 0 %
Lymphocytes Relative: 21 %
Lymphs Abs: 1.8 K/uL (ref 0.7–4.0)
MCH: 30.5 pg (ref 26.0–34.0)
MCHC: 34.6 g/dL (ref 30.0–36.0)
MCV: 88.2 fL (ref 80.0–100.0)
Monocytes Absolute: 0.6 K/uL (ref 0.1–1.0)
Monocytes Relative: 7 %
Neutro Abs: 6.3 K/uL (ref 1.7–7.7)
Neutrophils Relative %: 71 %
Platelets: 303 K/uL (ref 150–400)
RBC: 5.15 MIL/uL (ref 4.22–5.81)
RDW: 12.6 % (ref 11.5–15.5)
WBC: 8.9 K/uL (ref 4.0–10.5)
nRBC: 0 % (ref 0.0–0.2)

## 2023-12-04 LAB — URINALYSIS, ROUTINE W REFLEX MICROSCOPIC
Bilirubin Urine: NEGATIVE
Glucose, UA: NEGATIVE mg/dL
Hgb urine dipstick: NEGATIVE
Ketones, ur: NEGATIVE mg/dL
Leukocytes,Ua: NEGATIVE
Nitrite: NEGATIVE
Protein, ur: NEGATIVE mg/dL
Specific Gravity, Urine: 1.021 (ref 1.005–1.030)
pH: 5 (ref 5.0–8.0)

## 2023-12-04 LAB — HEMOGLOBIN A1C
Hgb A1c MFr Bld: 4.5 % — ABNORMAL LOW (ref 4.8–5.6)
Mean Plasma Glucose: 82.45 mg/dL

## 2023-12-04 LAB — TSH: TSH: 1.865 u[IU]/mL (ref 0.350–4.500)

## 2023-12-04 LAB — POCT URINE DRUG SCREEN - MANUAL ENTRY (I-SCREEN)
POC Amphetamine UR: NOT DETECTED
POC Buprenorphine (BUP): NOT DETECTED
POC Cocaine UR: NOT DETECTED
POC Marijuana UR: NOT DETECTED
POC Methadone UR: NOT DETECTED
POC Methamphetamine UR: NOT DETECTED
POC Morphine: NOT DETECTED
POC Oxazepam (BZO): NOT DETECTED
POC Oxycodone UR: NOT DETECTED
POC Secobarbital (BAR): NOT DETECTED

## 2023-12-04 LAB — MAGNESIUM: Magnesium: 1.8 mg/dL (ref 1.7–2.4)

## 2023-12-04 LAB — LIPID PANEL
Cholesterol: 178 mg/dL (ref 0–200)
HDL: 33 mg/dL — ABNORMAL LOW (ref >40)
LDL Cholesterol: 111 mg/dL — ABNORMAL HIGH (ref 0–99)
Total CHOL/HDL Ratio: 5.4 ratio
Triglycerides: 169 mg/dL — ABNORMAL HIGH (ref <150)
VLDL: 34 mg/dL (ref 0–40)

## 2023-12-04 LAB — VITAMIN B12: Vitamin B-12: <150 pg/mL — ABNORMAL LOW (ref 180–914)

## 2023-12-04 LAB — ETHANOL: Alcohol, Ethyl (B): <15 mg/dL (ref <15)

## 2023-12-04 LAB — VITAMIN D 25 HYDROXY (VIT D DEFICIENCY, FRACTURES): Vit D, 25-Hydroxy: 37.9 ng/mL (ref 30–100)

## 2023-12-04 MED ORDER — LORAZEPAM 2 MG/ML IJ SOLN: 2.0000 mg | Freq: Three times a day (TID) | INTRAMUSCULAR | Status: DC | PRN

## 2023-12-04 MED ORDER — HYDROXYZINE HCL 25 MG PO TABS
25.0000 mg | ORAL_TABLET | Freq: Three times a day (TID) | ORAL | Status: DC | PRN
  Administered 2023-12-04 – 2023-12-09 (×6): 25 mg via ORAL
  Filled 2023-12-04 (×6): qty 1

## 2023-12-04 MED ORDER — ACETAMINOPHEN 325 MG PO TABS: 650.0000 mg | ORAL_TABLET | Freq: Four times a day (QID) | ORAL | Status: DC | PRN

## 2023-12-04 MED ORDER — DIPHENHYDRAMINE HCL 50 MG PO CAPS: 50.0000 mg | ORAL_CAPSULE | Freq: Three times a day (TID) | ORAL | Status: DC | PRN

## 2023-12-04 MED ORDER — ALUM & MAG HYDROXIDE-SIMETH 200-200-20 MG/5ML PO SUSP: 30.0000 mL | ORAL | Status: DC | PRN

## 2023-12-04 MED ORDER — HALOPERIDOL 5 MG PO TABS: 5.0000 mg | ORAL_TABLET | Freq: Three times a day (TID) | ORAL | Status: DC | PRN

## 2023-12-04 MED ORDER — MAGNESIUM HYDROXIDE 400 MG/5ML PO SUSP: 30.0000 mL | Freq: Every day | ORAL | Status: DC | PRN

## 2023-12-04 MED ORDER — HALOPERIDOL LACTATE 5 MG/ML IJ SOLN: 5.0000 mg | Freq: Three times a day (TID) | INTRAMUSCULAR | Status: DC | PRN

## 2023-12-04 MED ORDER — DIPHENHYDRAMINE HCL 25 MG PO CAPS: 50.0000 mg | ORAL_CAPSULE | Freq: Three times a day (TID) | ORAL | Status: DC | PRN

## 2023-12-04 MED ORDER — HYDROXYZINE HCL 25 MG PO TABS: 25.0000 mg | ORAL_TABLET | Freq: Three times a day (TID) | ORAL | Status: DC | PRN

## 2023-12-04 MED ORDER — INFLUENZA VIRUS VACC SPLIT PF (FLUZONE) 0.5 ML IM SUSY
0.5000 mL | PREFILLED_SYRINGE | INTRAMUSCULAR | Status: DC
  Filled 2023-12-04: qty 0.5

## 2023-12-04 MED ORDER — DIPHENHYDRAMINE HCL 50 MG/ML IJ SOLN: 50.0000 mg | Freq: Three times a day (TID) | INTRAMUSCULAR | Status: DC | PRN

## 2023-12-04 MED ORDER — HALOPERIDOL LACTATE 5 MG/ML IJ SOLN: 10.0000 mg | Freq: Three times a day (TID) | INTRAMUSCULAR | Status: DC | PRN

## 2023-12-04 NOTE — Social Work (Signed)
  Pt has been accepted to [facility] on 12/04/2023 Bed assignment: 404-01   Pt meets inpatient criteria per: Donia Shiley, NP   Attending Physician will be: Dr. Prentis     Report can be called to: Adult unit: [phone]   Pt can arrive after: Pending labs, vol, UDS, EKG   Care Team Notified: [facility] Danika [facility] RN       Chesley Holt, [facility]    12/04/2023 11:40 AM

## 2023-12-04 NOTE — [facility] Group Notes (Signed)
 The focus of this group is to help patients review their daily goal of treatment and discuss progress on daily workbooks. Pt was attentive and appropriate during tonight's wrap up group discussion. Pt was able to share what brought them to hospital along with becoming more self aware of current health concerns. Pt shared that this is an big step and should have gotten needed help a long time ago.

## 2023-12-04 NOTE — Plan of Care (Signed)

## 2023-12-04 NOTE — Progress Notes (Addendum)
   12/04/23 0947  [facility] Triage Screening (Walk-ins at [facility] only)  What Is the Reason for Your Visit/Call Today? Pt presents to [facility] voluntarily accompanied by family. pt states he was recomended to come to [facility] for partial hospitalization/Intensive treatment program. Pt denies SI, HI, AVH, alcohol and drug use. Pt is calm and casually dressed.  How Long Has This Been Causing You Problems? <Week  Have You Recently Had Any Thoughts About Hurting Yourself? No  Are You Planning to Commit Suicide/Harm Yourself At This time? No  Have you Recently Had Thoughts About Hurting Someone Sherral? No  Are You Planning To Harm Someone At This Time? No  Are you currently experiencing any auditory, visual or other hallucinations? No  Have You Used Any Alcohol or Drugs in the Past 24 Hours? No  Do you have any current medical co-morbidities that require immediate attention? No  What Do You Feel Would Help You the Most Today? Treatment for Depression or other mood problem  Determination of Need Routine (7 days)  Options For Referral Intensive Outpatient Therapy

## 2023-12-04 NOTE — Plan of Care (Signed)
   Problem: Education: Goal: Emotional status will improve Outcome: Progressing Goal: Mental status will improve Outcome: Progressing

## 2023-12-04 NOTE — Progress Notes (Signed)
(  Sleep Hours) -  (Any PRNs that were needed, meds refused, or side effects to meds)- hydroxyzine 25mg   (Any disturbances and when (visitation, over night)- none  (Concerns raised by the patient)- none  (SI/HI/AVH)- denied

## 2023-12-04 NOTE — Tx Team (Signed)
 Initial Treatment Plan 12/04/2023 5:41 PM Crit Stotz [id number]    PATIENT STRESSORS: Legal issue   Marital or family conflict   Occupational concerns     PATIENT STRENGTHS: Average or above average intelligence  Communication skills  General fund of knowledge    PATIENT IDENTIFIED PROBLEMS: Suicidal thoughts                     DISCHARGE CRITERIA:  Improved stabilization in mood, thinking, and/or behavior  PRELIMINARY DISCHARGE PLAN: Return to previous living arrangement  PATIENT/FAMILY INVOLVEMENT: This treatment plan has been presented to and reviewed with the patient, Todd Tucker, and/or family member.  The patient and family have been given the opportunity to ask questions and make suggestions.  Asberry LITTIE Givens, RN 12/04/2023, 5:41 PM

## 2023-12-04 NOTE — Progress Notes (Addendum)
  Pt admitted for [facility] currently denies SI/HI AVH. C/O anxiety and depression. Pt states he feels hopeless and does know his purpose in life. Pt is tearful when talking about not having a place in life. Pt states he wants to escape his thoughts and changing his way of thinking states  I don't want to think like this anymore but I do not know how to change my thought process. Pt oriented to unit all questions answered. Dinner tray and fluids provided. Pt has hx of NSSIB cutting.   12/04/23 1800  Psychosocial Assessment  Patient Complaints Anxiety;Depression  Eye Contact Fair  Facial Expression Sad;Worried  Affect Preoccupied;Sad  Speech Logical/coherent  Interaction Assertive  Motor Activity Other (Comment) (WNL)  Appearance/Hygiene In scrubs  Behavior Characteristics Cooperative;Anxious  Mood Helpless;Preoccupied  Thought Process  Coherency WDL  Content WDL  Delusions None reported or observed  Perception WDL  Hallucination None reported or observed  Judgment WDL  Confusion None  Danger to Self  Current suicidal ideation? Denies

## 2023-12-04 NOTE — ED Provider Notes (Signed)
 [facility] Continuous Assessment Admission H&P  Date: 12/04/23 Patient Name: Todd Tucker MRN: [id number] Chief Complaint: worsening SI with passive SI   Diagnoses:  Final diagnoses:  Severe episode of recurrent major depressive disorder, without psychotic features (HCC)  GAD (generalized anxiety disorder)   HPI: Todd Tucker is a 43 y.o. male with a reported prior mental health history of MDD and GAD presenting to the [facility] behavioral health center accompanied by his parents for worsening depressive symptoms in the context of socioeconomic stressors.  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  Patient states that the male was working as a Production designer, theatre/television/film for the [employer] football team, and made false accusations that he asked to see her breasts, and that said accusations, landed him in jail x 3 months, which was followed by the loss of his job, the loss of his housing in [place], and a subsequent move to his parents home here in [place], [place].  Patient is tearful during entire encounter, talks about being 43 years old, and nothing to show for, there is nothing in my name, I do not have a purpose, I do not have a place in the world.  Patient follows this by making other negative statements about himself, talks about feeling worthless, having an extremely diminished sense of self-worth, states that he feels like a loser, repeatedly talks about the loss of his acquaintances, the loss of his marriage due to the accusations, states that he was married x 20+ years prior to the accusations.  He talks about having a lot of self-hatred, talks about voices that he hears in his head, and when he describes them, it sounds mostly like he is on intrusive thoughts which  repeatedly states you are not good enough, you are a loser.  Patient states that he hears this repeatedly in his head playing over and over again.  He states that the thoughts are overwhelming, and unbearable.  Patient reports that as a result of the overwhelming nature of the thoughts, it would be perfect if I went to sleep and never woke up, thereby presenting with passive suicidal ideations at this time.  He however denies intent or plan to harm himself.  He reports that the last time he had active suicidal ideations with a plan was in early July of this year, whereby he sat in his car and was going to use a plate to slit his wrist.  He denies access to a firearm.  Denies visual hallucinations.  Denies HI.  Denies history of psychosis, there are no overt signs of psychosis.  Patient reports depressive symptoms including insomnia, states that he currently sleeps 3 to 4 hours per night, but states is mostly 3 hours, reports difficulty falling asleep and staying asleep.  Reports being on trazodone  in the past, but stopping it due to dry mouth.  Reports that during incarceration earlier this year, hydroxyzine was helpful, but he currently does not have it due to not having a mental health provider or a PCP currently.  Patient reports anhedonia, reports that there is nothing that he can pinpoint which he enjoys doing at this moment.  He reports mostly isolating and not being around people.  He reports not seeing where he fits in the world.  Reports decreased energy level, repeatedly states I am trapped.  Reports an extremely poor appetite, states that he has lost 80 pounds in the past 3 months due to a lack of appetite.  Describes symptoms consistent with psychomotor retardation; reports difficulty getting his mind to coordinate with his physical body to do things that positively impact him.  Reports not seeing the cooking anything anymore.  Presents with symptoms consistent for GAD; reports overly  worrying, restlessness, with muscle tension.  Denies panic attacks.  Denies symptoms consistent with bipolar 1 or 2, denies any history of emotional physical or sexual abuse.  Denies substance abuse of any type, reports self-injurious behaviors via cutting himself to relieve stress, but states that he stopped self injuring in June 2025.  Denies having any medical problems, reports a history of anxiety in his mother, but states it is undiagnosed.  Reports current medications are Zoloft  200 mg, Lamictal  200 mg, states he has been on this medication since 2011, reports that this medications are currently not effective.  Reports that he was on Xanax  but he stopped it due to  the medications tendency to be habit-forming.  Trazodone  caused an extremely dry mouth, and was stopped.  Hydroxyzine was helpful for anxiety and sleep, but he ran out of it.  Denies other medication trials.  Denies medical problems.  Reports that he recently moved the past week from [place], to [place] to reside with his parents, does not work, wife left him.  Motivated at this time to get help for his mental health.  Recommended for partial hospitalization, but current symptoms place him at a higher risks of danger to self on the outpatient at this time, and we are recommending inpatient treatment for stabilization of mental status.  Patient is agreeable to this.  He denies any suicide attempts in the past.  Recommendations: Suicide Risk Assessment  SUICIDE RISK: High:  Frequent, intense, and enduring suicidal ideation, specific plan prior to hospitalization, no subjective intent, but some objective markers of intent (i.e., choice of lethal method), the method is accessible, some limited preparatory behavior, evidence of impaired self-control, severe dysphoria/symptomatology, multiple risk factors present, and few if any protective factors, with the exception of the availability of social support in his parents.  Total Time spent  with patient: 1.5 hours  Musculoskeletal  Strength & Muscle Tone: within normal limits Gait & Station: normal Patient leans: N/A  Psychiatric Specialty Exam  Presentation General Appearance:  Casual; Fairly Groomed  Eye Contact: Fair  Speech: Clear and Coherent  Speech Volume: Normal  Handedness: Right   Mood and Affect  Mood: Anxious; Depressed  Affect: Congruent   Thought Process  Thought Processes: Coherent  Descriptions of Associations:Intact  Orientation:Full (Time, Place and Person)  Thought Content:Logical    Hallucinations:Hallucinations: None  Ideas of Reference:None  Suicidal Thoughts:Suicidal Thoughts: No  Homicidal Thoughts:Homicidal Thoughts: No   Sensorium  Memory: Immediate Fair  Judgment: Good  Insight: Fair   Art therapist  Concentration: Fair  Attention Span: Good  Recall: Fair  Fund of Knowledge: Fair  Language: Fair   Psychomotor Activity  Psychomotor Activity:Psychomotor Activity: Normal   Assets  Assets: Communication Skills; Social Support   Sleep  Sleep:Sleep: Poor   Nutritional Assessment (For OBS and FBC admissions only) Has the patient had a weight loss or gain of 10 pounds or more in the last 3 months?: Yes Has the patient had a decrease in food intake/or appetite?: Yes Does the patient have dental problems?: No Does  the patient have eating habits or behaviors that may be indicators of an eating disorder including binging or inducing vomiting?: No Has the patient recently lost weight without trying?: 3 Has the patient been eating poorly because of a decreased appetite?: 1 Malnutrition Screening Tool Score: 4 Nutritional Assessment Referrals: Other (comment)    Physical Exam Constitutional:      Appearance: Normal appearance.  Musculoskeletal:     Cervical back: Normal range of motion.  Neurological:     General: No focal deficit present.     Mental Status: He is alert and  oriented to person, place, and time.  Psychiatric:        Behavior: Behavior normal.    Review of Systems  Psychiatric/Behavioral:  Positive for depression and suicidal ideas (passive). Negative for hallucinations, memory loss and substance abuse. The patient is nervous/anxious and has insomnia.   All other systems reviewed and are negative.   Blood pressure 135/82, pulse 80, temperature 98.4 F (36.9 C), temperature source Oral, resp. rate 17, SpO2 98%. There is no height or weight on file to calculate BMI.  Past Psychiatric History: MDD, GAD   Is the patient at risk to self? Yes  Has the patient been a risk to self in the past 6 months? Yes .    Has the patient been a risk to self within the distant past? Yes   Is the patient a risk to others? No   Has the patient been a risk to others in the past 6 months? No   Has the patient been a risk to others within the distant past? No   Past Medical History: denies  Family History: mom with anxiety  Social History: Currently does not have a job, resides with his parents.  Last Labs:  Admission on 12/02/2023, Discharged on 12/02/2023  Component Date Value Ref Range Status   Acetaminophen (Tylenol), Serum 12/02/2023 <10 (L)  10 - 30 ug/mL Final   Comment: (NOTE) Toxic concentrations can be more effectively related to post dose interval; > 200, > 100, and > 50 ug/mL serum concentrations correspond to toxic concentrations at 4, 8, and 12 hours post dose, respectively.  Performed at [facility], [address], [place], [place] [address]    Alcohol, Ethyl (B) 12/02/2023 <15  <15 mg/dL Final   Comment: (NOTE) For medical purposes only. Performed at [facility], [address], [place], [place] [address]    Salicylate Lvl 12/02/2023 <7.0 (L)  7.0 - 30.0 mg/dL Final   Performed at [facility], [address], [place], [place] [address]   Sodium 12/02/2023 140  135 - 145  mmol/L Final   Potassium 12/02/2023 4.2  3.5 - 5.1 mmol/L Final   Chloride 12/02/2023 103  98 - 111 mmol/L Final   CO2 12/02/2023 28  22 - 32 mmol/L Final   Glucose, Bld 12/02/2023 104 (H)  70 - 99 mg/dL Final   Glucose reference range applies only to samples taken after fasting for at least 8 hours.   BUN 12/02/2023 12  6 - 20 mg/dL Final   Creatinine, Ser 12/02/2023 1.00  0.61 - 1.24 mg/dL Final   Calcium 89/88/7974 9.6  8.9 - 10.3 mg/dL Final   GFR, Estimated 12/02/2023 >60  >60 mL/min Final   Comment: (NOTE) Calculated using the CKD-EPI Creatinine Equation (2021)    Anion gap 12/02/2023 9  5 - 15 Final   Performed at [facility], [address] [place], [place] [address]   Opiates 12/02/2023 NEGATIVE  NEGATIVE Final   Cocaine 12/02/2023 NEGATIVE  NEGATIVE Final   Benzodiazepines 12/02/2023 NEGATIVE  NEGATIVE Final   Amphetamines 12/02/2023 NEGATIVE  NEGATIVE Final   Tetrahydrocannabinol 12/02/2023 NEGATIVE  NEGATIVE Final   Barbiturates 12/02/2023 NEGATIVE  NEGATIVE Final   Methadone Scn, Ur 12/02/2023 NEGATIVE  NEGATIVE Final   Fentanyl 12/02/2023 NEGATIVE  NEGATIVE Final   Comment: (NOTE) Drug screen is for Medical Purposes only. Positive results are preliminary only. If confirmation is needed, notify lab within 5 days.  Drug Class                 Cutoff (ng/mL) Amphetamine and metabolites 1000 Barbiturate and metabolites 200 Benzodiazepine              200 Opiates and metabolites     300 Cocaine and metabolites     300 THC                         50 Fentanyl                    5 Methadone                   300  Trazodone  is metabolized in vivo to several metabolites,  including pharmacologically active m-CPP, which is excreted in the  urine.  Immunoassay screens for amphetamines and MDMA have potential  cross-reactivity with these compounds and may provide false positive  result.  Performed at [facility], [address], [place], [place] [address]    WBC 12/02/2023 7.5  4.0 - 10.5 K/uL Final   RBC 12/02/2023 5.27  4.22 - 5.81 MIL/uL Final   Hemoglobin 12/02/2023 15.9  13.0 - 17.0 g/dL Final   HCT 89/88/7974 46.9  39.0 - 52.0 % Final   MCV 12/02/2023 89.0  80.0 - 100.0 fL Final   MCH 12/02/2023 30.2  26.0 - 34.0 pg Final   MCHC 12/02/2023 33.9  30.0 - 36.0 g/dL Final   RDW 89/88/7974 12.6  11.5 - 15.5 % Final   Platelets 12/02/2023 295  150 - 400 K/uL Final   nRBC 12/02/2023 0.0  0.0 - 0.2 % Final   Neutrophils Relative % 12/02/2023 63  % Final   Neutro Abs 12/02/2023 4.8  1.7 - 7.7 K/uL Final   Lymphocytes Relative 12/02/2023 25  % Final   Lymphs Abs 12/02/2023 1.9  0.7 - 4.0 K/uL Final   Monocytes Relative 12/02/2023 9  % Final   Monocytes Absolute 12/02/2023 0.7  0.1 - 1.0 K/uL Final   Eosinophils Relative 12/02/2023 2  % Final   Eosinophils Absolute 12/02/2023 0.1  0.0 - 0.5 K/uL Final   Basophils Relative 12/02/2023 1  % Final   Basophils Absolute 12/02/2023 0.1  0.0 - 0.1 K/uL Final   Immature Granulocytes 12/02/2023 0  % Final   Abs Immature Granulocytes 12/02/2023 0.01  0.00 - 0.07 K/uL Final   Performed at [facility], [address] [place], [place] [address]    Allergies: Patient has no known allergies.  Medications:  Facility Ordered Medications  Medication   acetaminophen (TYLENOL) tablet 650 mg   alum & mag hydroxide-simeth (MAALOX/MYLANTA) 200-200-20 MG/5ML suspension 30 mL   magnesium hydroxide (MILK OF MAGNESIA) suspension 30 mL   haloperidol (HALDOL) tablet 5 mg   And   diphenhydrAMINE  (BENADRYL ) capsule 50 mg   haloperidol  lactate (HALDOL) injection 5 mg   And   diphenhydrAMINE  (BENADRYL ) injection 50 mg   And   LORazepam (ATIVAN) injection 2 mg   haloperidol lactate (HALDOL) injection 10 mg   And   diphenhydrAMINE  (BENADRYL ) injection 50 mg   And   LORazepam (ATIVAN) injection 2 mg   hydrOXYzine (ATARAX) tablet 25 mg   PTA Medications   Medication Sig   Nutritional Supplements (JUICE PLUS FIBRE PO) Take by mouth.   lamoTRIgine  (LAMICTAL ) 200 MG tablet Take 1 tablet (200 mg total) by mouth daily.   sertraline  (ZOLOFT ) 100 MG tablet Take 2 tablets (200 mg total) by mouth daily.   lurasidone  (LATUDA ) 40 MG TABS tablet Take 1/2 tablet daily with supper for one week, then increase to 1 tablet daily with supper    Medical Decision Making  -Recommended for inpatient hospitalization for treatment and stabilization of his mental health. - Ordered baseline labs including TSH, hemoglobin A1c, CMP, CBC, lipid panel, vitamin B-12, vitamin D, EKG. - Hydroxyzine 25 mg 3 times daily as needed for anxiety -Will withhold ordering medications for depressive symptoms, and will defer this to the inpatient team.   Recommendations  Based on my evaluation the patient appears to have an emergency mental health condition for which I recommend the patient be transferred to an inpatient behavioral health unit for treatment and stabilization.   Donia Snell, NP 12/04/23  12:24 PM

## 2023-12-04 NOTE — Discharge Instructions (Addendum)
 Please transfer the patient to the [facility] for mental health treatment and stabilization

## 2023-12-04 NOTE — ED Notes (Signed)
 The patient is oriented to person, place, and time. Pt calm and cooperative, skin intact. Pt denies hallucinations. Vital signs were taken and within normal limits  Risk assessment was completed, revealing low risk for self-harm or harm to others, pt denies si at this time. Safety precautions have been implemented as appropriate.

## 2023-12-04 NOTE — ED Notes (Signed)
 Vital signs at discharge were stable and within normal limits. No signs of acute distress were noted. The patient was medically cleared for discharge.  Patient was discharged to [facility] Discharge instructions were reviewed with patient  Transportation arranged with [facility], report given to rebecca, rn  Patient verbalized understanding of discharge instructions and was provided a copy.

## 2023-12-04 NOTE — BH Assessment (Signed)
 Comprehensive Clinical Assessment (CCA) Note  12/04/2023 Todd Tucker [id number]  DISPOSITION: Per Donia Snell NP pt is recommended for inpatient psychiatric admission.   The patient demonstrates the following risk factors for suicide: Chronic risk factors for suicide include: psychiatric disorder of Bipolar 2 and PTSD. Acute risk factors for suicide include: family or marital conflict, unemployment, social withdrawal/isolation, and loss (financial, interpersonal, professional). Protective factors for this patient include: positive social support, responsibility to others (children, family), and hope for the future. Considering these factors, the overall suicide risk at this point appears to be high. Patient is appropriate for outpatient follow up.   Per Triage assessment: "Pt presents to [facility] voluntarily accompanied by family. pt states he was recomended to come to [facility] for partial hospitalization/Intensive treatment program. Pt denies SI, HI, AVH, alcohol and drug use. Pt is calm and casually dressed."  With further assessment: Pt is a 43 yo male who presented voluntarily and unaccompanied due to worsening depression and SI. Pt reported passive SI in the form of wishing he could go to sleep and not wake up. Pt reported his last active SI was in early July 2025 when he thought about cutting his wrist in his car to kill himself. Pt denied HI, NSSH, AVH and psychiatric hospitalizations and any substance use. Pt stated that his life has been ruined due to false accusations of inappropriate behavior with a student as her coach. As a result, pt stated that he went to jail for 3 months, lost his job, his home, his wife and friends. Pt stated that he has had to moved back in with his parents in [place] as a result. Pt stated he currently has not psychiatric providers and has had no psychiatric hospitalizations. Pt denied any substance use.    Chief Complaint:  Chief Complaint  Patient presents with    Depression   Visit Diagnosis:  Bipolar II d/o    CCA Screening, Triage and Referral (STR)  Patient Reported Information How did you hear about us ? Self  What Is the Reason for Your Visit/Call Today? Pt is a 43 yo male who presented voluntarily and unaccompanied due to worsening depression and SI. Pt reported passive SI in the form of wishing he could go to sleep and not wake up. Pt reported his last active SI was in early July 2025 when he thought about cutting his wrist in his car to kill himself. Pt denied HI, NSSH, AVH and psychiatric hospitalizations and any substance use. Pt stated that his life has been ruined due to false accusations of inappropriate behavior with a student as her coach. As a result, pt stated that he went to jail for 3 months, lost his job, his home, his wife and friends. Pt stated that he has had to moved back in with his parents in [place] as a result. Pt stated he currently has not psychiatric providers and has had no psychiatric hospitalizations. Pt denied any substance use.  How Long Has This Been Causing You Problems? 1-6 months  What Do You Feel Would Help You the Most Today? Alcohol or Drug Use Treatment   Have You Recently Had Any Thoughts About Hurting Yourself? Yes  Are You Planning to Commit Suicide/Harm Yourself At This time? No   Flowsheet Row ED from 12/04/2023 in [facility] ED from 12/02/2023 in [facility] Emergency Department at [facility]  C-SSRS RISK CATEGORY Error: Q3, 4, or 5 should not be populated when Q2 is No Error:  Question 6 not populated    Have you Recently Had Thoughts About Hurting Someone [place]? No  Are You Planning to Harm Someone at This Time? No  Explanation: na   Have You Used Any Alcohol or Drugs in the Past 24 Hours? No  How Long Ago Did You Use Drugs or Alcohol? na What Did You Use and How Much? na  Do You Currently Have a Therapist/Psychiatrist? No  Name of  Therapist/Psychiatrist:    Have You Been Recently Discharged From Any Office Practice or Programs? No  Explanation of Discharge From Practice/Program: na    CCA Screening Triage Referral Assessment Type of Contact: Face-to-Face  Telemedicine Service Delivery:   Is this Initial or Reassessment?   Date Telepsych consult ordered in [facility]:    Time Telepsych consult ordered in [facility]:    Location of Assessment: [facility]  Provider Location: [facility] Assessment Services   Collateral Involvement: none   Does Patient Have a Automotive engineer Guardian? No  Legal Guardian Contact Information: adult  Copy of Legal Guardianship Form: -- (na)  Legal Guardian Notified of Arrival: -- (na)  Legal Guardian Notified of Pending Discharge: -- (na)  If Minor and Not Living with Parent(s), Who has Custody? adult  Is CPS involved or ever been involved? -- (none reported)  Is APS involved or ever been involved? -- (none reported)   Patient Determined To Be At Risk for Harm To Self or Others Based on Review of Patient Reported Information or Presenting Complaint? Yes, for Self-Harm  Method: Plan without intent  Availability of Means: Has close by  Intent: Vague intent or NA  Notification Required: No need or identified person  Additional Information for Danger to Others Potential: -- (na)  Additional Comments for Danger to Others Potential: none  Are There Guns or Other Weapons in Your Home? -- (pt denied)  Types of Guns/Weapons: pt denied access to weapons, firearms  Are These Weapons Safely Secured?                            -- (na)  Who Could Verify You Are Able To Have These Secured: na  Do You Have any Outstanding Charges, Pending Court Dates, Parole/Probation? pt denied  Contacted To Inform of Risk of Harm To Self or Others: -- (na)    Does Patient Present under Involuntary Commitment? No    [place] of Residence: [place]   Patient Currently  Receiving the Following Services: Not Receiving Services   Determination of Need: Emergent (2 hours) (Per Donia Snell NP pt is recommended for inpatient psychiatric admission.)   Options For Referral: Inpatient Hospitalization     CCA Biopsychosocial Patient Reported Schizophrenia/Schizoaffective Diagnosis in Past: No   Strengths: able to accept help   Mental Health Symptoms Depression:  Difficulty Concentrating; Hopelessness; Sleep (too much or little); Tearfulness; Worthlessness   Duration of Depressive symptoms: Duration of Depressive Symptoms: Greater than two weeks   Mania:  None   Anxiety:   Worrying; Tension; Sleep; Difficulty concentrating   Psychosis:  None   Duration of Psychotic symptoms:    Trauma:  Avoids reminders of event   Obsessions:  None   Compulsions:  None   Inattention:  N/A   Hyperactivity/Impulsivity:  N/A   Oppositional/Defiant Behaviors:  N/A   Emotional Irregularity:  Recurrent suicidal behaviors/gestures/threats   Other Mood/Personality Symptoms:  none    Mental Status Exam Appearance and self-care  Stature:  Average   Weight:  Overweight   Clothing:  Casual   Grooming:  Normal   Cosmetic use:  None   Posture/gait:  Normal   Motor activity:  Not Remarkable   Sensorium  Attention:  Distractible   Concentration:  Normal   Orientation:  X5   Recall/memory:  Normal   Affect and Mood  Affect:  Depressed; Flat; Tearful   Mood:  Depressed; Dysphoric; Hopeless   Relating  Eye contact:  Normal   Facial expression:  Depressed; Constricted   Attitude toward examiner:  Cooperative   Thought and Language  Speech flow: Clear and Coherent   Thought content:  Appropriate to Mood and Circumstances   Preoccupation:  None   Hallucinations:  None   Organization:  Intact   Company secretary of Knowledge:  Average   Intelligence:  Average   Abstraction:  Functional   Judgement:  Impaired   Reality  Testing:  Adequate   Insight:  Lacking; Flashes of insight   Decision Making:  Vacilates   Social Functioning  Social Maturity:  Responsible   Social Judgement:  Normal   Stress  Stressors:  Grief/losses; Family conflict; Housing; Surveyor, quantity; Work   Coping Ability:  Exhausted; Overwhelmed   Skill Deficits:  Self-care   Supports:  Family; Friends/Service system; Support needed     Religion: Religion/Spirituality Are You A Religious Person?: No How Might This Affect Treatment?: none  Leisure/Recreation: Leisure / Recreation Do You Have Hobbies?: No  Exercise/Diet: Exercise/Diet Do You Exercise?: No Have You Gained or Lost A Significant Amount of Weight in the Past Six Months?: No Do You Follow a Special Diet?: No Do You Have Any Trouble Sleeping?: Yes Explanation of Sleeping Difficulties: Pt has had difficulty sleeping for quite a while getting he estimates 3-4 hours per night.   CCA Employment/Education Employment/Work Situation: Employment / Work Situation Employment Situation: Unemployed Patient's Job has Been Impacted by Current Illness: No Has Patient ever Been in Equities trader?: No  Education: Education Is Patient Currently Attending School?: No Last Grade Completed: 16 Did You Product manager?: Yes What Type of College Degree Do you Have?: unknown Did You Have An Individualized Education Program (IIEP): No Did You Have Any Difficulty At School?: No Patient's Education Has Been Impacted by Current Illness: No   CCA Family/Childhood History Family and Relationship History: Family history Marital status: Separated Separated, when?: Summer, 2025 What types of issues is patient dealing with in the relationship?: false accusation of inappropriate behavior with a male student Additional relationship information: na Does patient have children?: No  Childhood History:  Childhood History By whom was/is the patient raised?: Both parents Did patient  suffer any verbal/emotional/physical/sexual abuse as a child?: No Did patient suffer from severe childhood neglect?: No Has patient ever been sexually abused/assaulted/raped as an adolescent or adult?: No Was the patient ever a victim of a crime or a disaster?: No Witnessed domestic violence?: No Has patient been affected by domestic violence as an adult?: No       CCA Substance Use Alcohol/Drug Use: Alcohol / Drug Use Pain Medications: see MAR Prescriptions: see MAR Over the Counter: see MAR History of alcohol / drug use?: No history of alcohol / drug abuse                         ASAM's:  Six Dimensions of Multidimensional Assessment  Dimension 1:  Acute Intoxication and/or Withdrawal Potential:  Dimension 2:  Biomedical Conditions and Complications:      Dimension 3:  Emotional, Behavioral, or Cognitive Conditions and Complications:     Dimension 4:  Readiness to Change:     Dimension 5:  Relapse, Continued use, or Continued Problem Potential:     Dimension 6:  Recovery/Living Environment:     ASAM Severity Score:    ASAM Recommended Level of Treatment:     Substance use Disorder (SUD)    Recommendations for Services/Supports/Treatments:    Disposition Recommendation per psychiatric provider: We recommend inpatient psychiatric hospitalization when medically cleared. Patient is under voluntary admission status at this time; please IVC if attempts to leave hospital.   DSM5 Diagnoses: Patient Active Problem List   Diagnosis Date Noted   Major depressive disorder, recurrent episode, moderate (HCC) 12/02/2023   Posttraumatic stress disorder 11/04/2017   Bipolar II disorder (HCC) 11/04/2017     Referrals to Alternative Service(s): Referred to Alternative Service(s):   Place:   Date:   Time:    Referred to Alternative Service(s):   Place:   Date:   Time:    Referred to Alternative Service(s):   Place:   Date:   Time:    Referred to Alternative  Service(s):   Place:   Date:   Time:     Kerry-Anne Mezo T, Counselor

## 2023-12-05 DIAGNOSIS — F424 Excoriation (skin-picking) disorder: Secondary | ICD-10-CM | POA: Insufficient documentation

## 2023-12-05 DIAGNOSIS — F411 Generalized anxiety disorder: Secondary | ICD-10-CM | POA: Insufficient documentation

## 2023-12-05 DIAGNOSIS — E538 Deficiency of other specified B group vitamins: Secondary | ICD-10-CM | POA: Insufficient documentation

## 2023-12-05 DIAGNOSIS — F605 Obsessive-compulsive personality disorder: Secondary | ICD-10-CM

## 2023-12-05 DIAGNOSIS — F332 Major depressive disorder, recurrent severe without psychotic features: Principal | ICD-10-CM

## 2023-12-05 DIAGNOSIS — F6089 Other specific personality disorders: Secondary | ICD-10-CM | POA: Insufficient documentation

## 2023-12-05 LAB — PROLACTIN: Prolactin: 8.9 ng/mL (ref 3.9–22.7)

## 2023-12-05 MED ORDER — CYANOCOBALAMIN 1000 MCG/ML IJ SOLN
1000.0000 ug | Freq: Once | INTRAMUSCULAR | Status: DC
  Filled 2023-12-05: qty 1

## 2023-12-05 MED ORDER — SERTRALINE HCL 100 MG PO TABS
100.0000 mg | ORAL_TABLET | Freq: Every day | ORAL | Status: AC
  Administered 2023-12-06: 100 mg via ORAL
  Filled 2023-12-05: qty 1

## 2023-12-05 MED ORDER — VITAMIN B-12 1000 MCG PO TABS
1000.0000 ug | ORAL_TABLET | Freq: Every day | ORAL | Status: DC
  Administered 2023-12-05 – 2023-12-10 (×6): 1000 ug via ORAL
  Filled 2023-12-05 (×6): qty 1

## 2023-12-05 MED ORDER — VENLAFAXINE HCL ER 37.5 MG PO CP24: 37.5000 mg | ORAL_CAPSULE | Freq: Every day | ORAL | Status: DC

## 2023-12-05 MED ORDER — VENLAFAXINE HCL ER 75 MG PO CP24
75.0000 mg | ORAL_CAPSULE | Freq: Every day | ORAL | Status: DC
  Administered 2023-12-07: 75 mg via ORAL
  Filled 2023-12-05: qty 1

## 2023-12-05 MED ORDER — SERTRALINE HCL 50 MG PO TABS
50.0000 mg | ORAL_TABLET | Freq: Every day | ORAL | Status: DC
  Administered 2023-12-07: 50 mg via ORAL
  Filled 2023-12-05: qty 1

## 2023-12-05 MED ORDER — LAMOTRIGINE 100 MG PO TABS
200.0000 mg | ORAL_TABLET | Freq: Every day | ORAL | Status: DC
Start: 2023-12-05 — End: 2023-12-10
  Administered 2023-12-05 – 2023-12-10 (×6): 200 mg via ORAL
  Filled 2023-12-05 (×6): qty 2

## 2023-12-05 MED ORDER — VENLAFAXINE HCL ER 75 MG PO CP24: 75.0000 mg | ORAL_CAPSULE | Freq: Every day | ORAL | Status: DC

## 2023-12-05 MED ORDER — VENLAFAXINE HCL ER 37.5 MG PO CP24
37.5000 mg | ORAL_CAPSULE | Freq: Every day | ORAL | Status: AC
  Administered 2023-12-05 – 2023-12-06 (×2): 37.5 mg via ORAL
  Filled 2023-12-05 (×2): qty 1

## 2023-12-05 MED ORDER — SERTRALINE HCL 100 MG PO TABS
100.0000 mg | ORAL_TABLET | Freq: Every day | ORAL | Status: DC
  Administered 2023-12-05: 100 mg via ORAL
  Filled 2023-12-05: qty 1

## 2023-12-05 MED ORDER — CYANOCOBALAMIN 1000 MCG/ML IJ SOLN
1000.0000 ug | Freq: Once | INTRAMUSCULAR | Status: AC
  Filled 2023-12-05: qty 1

## 2023-12-05 NOTE — Group Note (Signed)
 Date:  12/05/2023 Time:  3:53 PM  Group Topic/Focus:  Boundaries: The focus of this group is to discuss five boundary types and describes what healthy, porous, and rigid boundaries look like for each. This encourages clients to reflect on their boundaries, understand how they differ, and identify strengths and weaknesses.    Participation Level:  Active  Participation Quality:  Appropriate  Affect:  Appropriate  Cognitive:  Appropriate  Insight: Appropriate  Engagement in Group:  Engaged  Modes of Intervention:  Discussion and Education   Todd Tucker 12/05/2023, 3:53 PM

## 2023-12-05 NOTE — Plan of Care (Signed)
   Problem: Education: Goal: Emotional status will improve Outcome: Progressing Goal: Mental status will improve Outcome: Progressing Goal: Verbalization of understanding the information provided will improve Outcome: Progressing

## 2023-12-05 NOTE — BHH Suicide Risk Assessment (Signed)
 [facility] Admission Suicide Risk Assessment   Nursing information obtained from:  Patient Demographic factors:  Male, Divorced or widowed, Caucasian, Living alone Current Mental Status:  NA Loss Factors:  Loss of significant relationship, Decrease in vocational status Historical Factors:  NA Risk Reduction Factors:  Religious beliefs about death, Living with another person, especially a relative  Total Time spent with patient: 45 min Principal Problem: Major depressive disorder, recurrent episode, severe (HCC) Diagnosis:  Principal Problem:   Major depressive disorder, recurrent episode, severe (HCC) Active Problems:   Generalized anxiety disorder   Cluster C personality disorder (HCC)   B12 deficiency   Subjective Data:  The patient is a 43 year old male with no prior psychiatric admissions, previous diagnoses made by outpatient mental health provider of obsessional thoughts and actions and PTSD.  The patient presented voluntarily to the [facility] reporting passive suicidal thoughts.  He is currently admitted to the [facility] behavioral health hospital on a voluntary basis.  Pertinent social history was collected from the patient as follows.  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  He reports being released from jail on 10/10 after felony charges related to sexual indecency with a minor.  He says the charges against him are false.  He reports separation from his wife after the incident occurred in June.  Says he has no children.  Reports living with his parents since released from jail.  He denies the use of any alcohol or illegal drugs.  Denies access to firearms.  Review of symptoms notable for prominent symptoms of anxiety and depression.  He reports frequent issues with being unsure about the unknown.  He reports a history of debilitating panic attacks that have been in remission for the past 2 years.  He reports a longstanding  history of excoriation, typically involving his fingers.  He says he does this in response to anxiety.  Throughout the interview he is noted to ruminate out loud for several minutes, typically talking about topics of self inadequacy and being unsure if people love him.  He reports a previous history of punching himself, which he says he last did in May of this year.  He reports a more remote history of cutting behaviors.  No prior suicide attempts.  Review of symptoms notable for classic depressive symptoms: Poor sleep, anhedonia, depressed mood, and passive thoughts of not wanting to be alive.  Denies having active thoughts of suicide, denies plans or intentions of harming himself.  Denies even passive thoughts of suicide since admission to the behavioral hospital.  Pertinent negatives include lack of symptomatology for obsessive-compulsive disorder.  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  He reports meticulous attention to detail and the need for rigidity in his everyday schedule.  Despite this, the patient does not have intrusive obsessive thoughts characteristic of OCD such as fears of contamination or disturbing sexual imagery/impulses.  He reports frequent thoughts about self inadequacy which he describes as intrusive and obsessive.  There are no ritualistic compulsions.  Patient reports a history of bipolar affective disorder.  He reports experiencing up episodes that last about 4 hours, just like a sugar high.  He says the longest he has felt mood elevation is 1 to 2 days.  He says these episodes typically involve laughing a lot and going off schedule.  Denies AVH/paranoia.  Denies PTSD symptoms or traumatic exposure previously.  Continued Clinical Symptoms:  Alcohol Use Disorder Identification Test Final Score (  AUDIT): 0 The Alcohol Use Disorders Identification Test, Guidelines for Use in Primary Care, Second Edition.  World Science writer [facility]). Score between 0-7:  no or low risk  or alcohol related problems. Score between 8-15:  moderate risk of alcohol related problems. Score between 16-19:  high risk of alcohol related problems. Score 20 or above:  warrants further diagnostic evaluation for alcohol dependence and treatment.   CLINICAL FACTORS:   Depression:   Anhedonia  Psychiatric Specialty Exam: Physical Exam Constitutional:      Appearance: the patient is not toxic-appearing.  Pulmonary:     Effort: Pulmonary effort is normal.  Neurological:     General: No focal deficit present.     Mental Status: the patient is alert and oriented to person, place, and time.   Review of Systems  Respiratory:  Negative for shortness of breath.   Cardiovascular:  Negative for chest pain.  Gastrointestinal:  Negative for abdominal pain, constipation, diarrhea, nausea and vomiting.  Neurological:  Negative for headaches.      BP 134/83 (BP Location: Right Arm)   Pulse 82   Temp (!) 97.4 F (36.3 C) (Oral)   Resp 20   Ht 5' 7 (1.702 m)   Wt 104.8 kg   SpO2 96%   BMI 36.18 kg/m   General Appearance: Fairly Groomed  Eye Contact:  Good  Speech:  Clear and Coherent  Volume:  Normal  Mood:  down  Affect:  Congruent  Thought Process:  Coherent  Orientation:  Full (Time, Place, and Person)  Thought Content: Logical   Suicidal Thoughts:  No  Homicidal Thoughts:  No  Memory:  Immediate;   Good  Judgement:  poor  Insight:  poor  Psychomotor Activity:  Normal  Concentration:  Concentration: Good  Recall:  Good  Fund of Knowledge: Good  Language: Good  Akathisia:  No  Handed:  not assessed  AIMS (if indicated): not done  Assets:  Communication Skills Desire for Improvement Financial Resources/Insurance Housing Leisure Time Physical Health  ADL's:  Intact  Cognition: WNL  Sleep:  Fair      COGNITIVE FEATURES THAT CONTRIBUTE TO RISK:  Closed-mindedness    SUICIDE RISK:  Moderate: Frequent suicidal ideation with limited intensity, and duration,  some specificity in terms of plans, no associated intent, good self-control, limited dysphoria/symptomatology, some risk factors present, and identifiable protective factors, including available and accessible social support.  PLAN OF CARE: See H and P  I certify that inpatient services furnished can reasonably be expected to improve the patient's condition.   Karleen Kaufmann, MD PGY-4

## 2023-12-05 NOTE — Progress Notes (Signed)
   12/05/23 2056  Psych Admission Type (Psych Patients Only)  Admission Status Voluntary  Psychosocial Assessment  Patient Complaints Anxiety;Depression  Eye Contact Fair  Facial Expression Worried  Affect Preoccupied;Sad  Speech Logical/coherent  Interaction Assertive  Motor Activity Other (Comment) (WDL)  Appearance/Hygiene Unremarkable  Behavior Characteristics Cooperative  Mood Depressed  Thought Process  Coherency WDL  Content WDL  Delusions None reported or observed  Perception WDL  Hallucination None reported or observed  Judgment WDL  Confusion None  Danger to Self  Current suicidal ideation? Denies  Agreement Not to Harm Self Yes  Description of Agreement Verbal  Danger to Others  Danger to Others None reported or observed

## 2023-12-05 NOTE — Plan of Care (Signed)
   Problem: Education: Goal: Emotional status will improve Outcome: Progressing Goal: Mental status will improve Outcome: Progressing Goal: Verbalization of understanding the information provided will improve Outcome: Progressing   Problem: Activity: Goal: Interest or engagement in activities will improve Outcome: Progressing

## 2023-12-05 NOTE — [facility] Counselor (Signed)
 Adult Comprehensive Assessment  Patient ID: Todd Tucker, male   DOB: [birth year], 43 y.o.   MRN: [id number]  Information Source: Information source: Patient  Current Stressors:  Patient states their primary concerns and needs for treatment are:: Anxiety and depression, past suicidal ideation but not right now. I would've said I was suicidal 3 months ago but not right now. They did an assessment at [facility] and told me they'd have PHP as an option and that I didn't need inpatient so I don't really know why I'm here. I don't know where my place is in the world and I don't know whether this place is the best for me. Patient reported depression started 25 years ago and anxiety comes and goes with no triggers, it just shows up. He reported social situations are usually a stressor and states as the day winds down, I get more depressed. I wake up happy and by the end of the dayit's really bad. I don't have a whole lot of hope but don't want to die. Patient states their goals for this hospitilization and ongoing recovery are:: getting into a PHP or intensive outpatient program. Educational / Learning stressors: None reported Employment / Job issues: Patient reported not working right now Family Relationships: I'm separated from my wife, which happened over the summer. I haven't talked to her in 3 months now so that's been tough. Financial / Lack of resources (include bankruptcy): None reported Housing / Lack of housing: None reported Physical health (include injuries & life threatening diseases): None reported Social relationships: I have 2 close friends, but no one else outside of family. I think that's part of the reason why I don't know my place right now. Substance abuse: None reported Bereavement / Loss: None reported  Living/Environment/Situation:  Living Arrangements: Parent Living conditions (as described by patient or guardian): with my parents Who else lives in the home?: me and my  parents How long has patient lived in current situation?: since last Friday. Patient reported living in [place] previously but has since relocated to [place]. What is atmosphere in current home: Comfortable, Loving, Supportive  Family History:  Separated, when?: Summer, 2025 What types of issues is patient dealing with in the relationship?: false accusation of inappropriate behavior with a male student Additional relationship information: N/A Are you sexually active?: No What is your sexual orientation?: Heterosexual Has your sexual activity been affected by drugs, alcohol, medication, or emotional stress?: No Does patient have children?: No  Childhood History:  By whom was/is the patient raised?: Both parents Additional childhood history information: None reported Description of patient's relationship with caregiver when they were a child: It was good, my dad was a disciplinarian and my mom was great. Overall they were great Patient's description of current relationship with people who raised him/her: It's great, they love me and I think they did a great job. How were you disciplined when you got in trouble as a child/adolescent?: I got yelled at and spanked maybe twice Does patient have siblings?: Yes Number of Siblings: 1 Description of patient's current relationship with siblings: It's fair. Did patient suffer any verbal/emotional/physical/sexual abuse as a child?: No Did patient suffer from severe childhood neglect?: No Has patient ever been sexually abused/assaulted/raped as an adolescent or adult?: No Was the patient ever a victim of a crime or a disaster?: No Witnessed domestic violence?: No Has patient been affected by domestic violence as an adult?: No  Education:  Highest grade of school patient has completed: Bachelor's degree,  half credits towards master's degree Currently a student?: No Learning disability?: No  Employment/Work Situation:    Employment Situation: Unemployed Patient's Job has Been Impacted by Current Illness: No What is the Longest Time Patient has Held a Job?: 4 years 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?: No  Financial Resources:   Surveyor, quantity resources: Support from parents / caregiver Does patient have a Lawyer or guardian?: No  Alcohol/Substance Abuse:   What has been your use of drugs/alcohol within the last 12 months?: None reported; patient denied the use of illicit substances and the consumption of alcohol. If attempted suicide, did drugs/alcohol play a role in this?: No Alcohol/Substance Abuse Treatment Hx: Denies past history If yes, describe treatment: N/A Has alcohol/substance abuse ever caused legal problems?: No  Social Support System:   Patient's Community Support System: Fair Describe Community Support System: Family, I have two close friends but I don't feel like I have anyone. Type of faith/religion: Sherlean How does patient's faith help to cope with current illness?: Bible and prayer everyday  Leisure/Recreation:   Do You Have Hobbies?: No  Strengths/Needs:   What is the patient's perception of their strengths?: emotional intelligence, empathy, able to collaborate and communicate Patient states they can use these personal strengths during their treatment to contribute to their recovery: It'll help me move forward with whatever is going on with me and hopefully get me the help I need. Patient states these barriers may affect/interfere with their treatment: None reported Patient states these barriers may affect their return to the community: None reported Other important information patient would like considered in planning for their treatment: N/A  Discharge Plan:   Currently receiving community mental health services: No (Needs therapy and med mgmt.) Patient states  concerns and preferences for aftercare planning are: None reported Patient states they will know when they are safe and ready for discharge when: I think that's what the doctors are for. Does patient have access to transportation?: Yes Does patient have financial barriers related to discharge medications?: No Patient description of barriers related to discharge medications: N/A Will patient be returning to same living situation after discharge?: Yes  Summary/Recommendations:   Summary and Recommendations (to be completed by the evaluator): Todd Tucker is a 43 year old male who was voluntarily admitted to [facility] from [facility] due to worsening depression and anxiety. Patient reported some feelings of hopelessness. He reported he went to [facility] looking for a PHP/IOP program and isn't sure how he ended up in an inpatient hospital. He denied SI/HI and A/VH. He reported stressors include not knowing where his place is in life and dealing with being separated from his wife since this summer. Patient denied the use of illicit, mood-altering substances including the consumption of alcohol. Patient's urinary drug screen is negative for all illicit, mood-altering substances. Upon assessment, patient was calm and cooperative. He denied current engagement in outpatient mental health treatment but is interested in PHP or IOP. CSW team to make appropriate referrals for treatment.While here, Todd Tucker can benefit from crisis stabilization, medication management, therapeutic milieu, and referrals for services.   Todd Tucker, LCSWA 12/05/2023

## 2023-12-05 NOTE — Group Note (Signed)
 Date:  12/05/2023 Time:  9:30 AM  Group Topic/Focus:  Goals Group:   The focus of this group is to help patients establish daily goals to achieve during treatment and discuss how the patient can incorporate goal setting into their daily lives to aide in recovery. Patients worked independently on worksheets regarding SMART goals, and the group discussed Maslow's hierarchy and its importance for goal setting.   Participation Level:  Did Not Attend  Participation Quality:  N/A  Affect:  N/A  Cognitive:  N/A  Insight: None  Engagement in Group:  None  Modes of Intervention:  N/A  Additional Comments:  Patient did not attend goals group.  Kristi HERO Korrine Sicard 12/05/2023, 9:30 AM

## 2023-12-05 NOTE — Group Note (Signed)
 Recreation Therapy Group Note   Group Topic:Leisure Education  Group Date: 12/05/2023 Start Time: 0930 End Time: 1005 Facilitators: Luca Burston-McCall, LRT,CTRS Location: [address]   Group Topic: Leisure Education  Goal Area(s) Addresses:  Patient will successfully identify positive leisure and recreation activities.  Patient will acknowledge benefits of participation in healthy leisure activities post discharge.    Behavioral Response:    Intervention: Competitive Group Game    Activity: Guess the Lyric. In groups or individually, patients will take turns spinning the spinner. Whatever category ([address], Dance, [place], [place], R&B and Hip Hop) the spinner lands on, the individual/group has to fill in the missing song lyric. The spinner also has a section where you can pick the category you want or steal a card from one of the other people/groups. The individual/group with the most cards at the end, wins the game.    Education:  Teacher, English as a foreign language, Leisure as Merchant navy officer, Programmer, applications, Building control surveyor   Education Outcome: Acknowledges education/In group clarification offered/Needs additional education   Affect/Mood: N/A   Participation Level: Did not attend    Clinical Observations/Individualized Feedback:      Plan: Continue to engage patient in RT group sessions 2-3x/week.   Sheriden Archibeque-McCall, LRT,CTRS 12/05/2023 12:10 PM

## 2023-12-05 NOTE — Progress Notes (Signed)
   12/05/23 1100  Psych Admission Type (Psych Patients Only)  Admission Status Voluntary  Psychosocial Assessment  Patient Complaints Anxiety;Depression  Eye Contact Fair  Facial Expression Sad;Worried  Affect Sad;Preoccupied  Speech Logical/coherent  Interaction Assertive  Motor Activity Slow  Appearance/Hygiene Unremarkable  Behavior Characteristics Cooperative  Mood Depressed;Preoccupied  Thought Process  Coherency WDL  Content WDL  Delusions None reported or observed  Perception WDL  Hallucination None reported or observed  Judgment WDL  Confusion None  Danger to Self  Current suicidal ideation? Denies  Description of Suicide Plan No Plan  Agreement Not to Harm Self Yes  Description of Agreement Verbal  Danger to Others  Danger to Others None reported or observed

## 2023-12-05 NOTE — H&P (Signed)
 Psychiatric Admission Assessment Adult  Patient Identification: Todd Tucker MRN:  [id number] Date of Evaluation:  12/05/2023 Chief Complaint:  MDD (major depressive disorder), recurrent severe, without psychosis (HCC) [F33.2] Principal Diagnosis: Major depressive disorder, recurrent episode, severe (HCC) Diagnosis:  Principal Problem:   Major depressive disorder, recurrent episode, severe (HCC) Active Problems:   Generalized anxiety disorder   Cluster C personality disorder (HCC)   B12 deficiency   Excoriation (skin-picking) disorder    History of Present Illness: The patient is a 43 year old male with no prior psychiatric admissions, previous diagnoses made by outpatient mental health provider of obsessional thoughts and actions and PTSD.  The patient presented voluntarily to the [facility] reporting passive suicidal thoughts.  He is currently admitted to the [facility] behavioral health hospital on a voluntary basis.  Pertinent social history was collected from the patient as follows.  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  He reports being released from jail on 10/10 after felony charges related to sexual indecency with a minor.  He says the charges against him are false.  He reports separation from his wife after the incident occurred in June.  Says he has no children.  Reports living with his parents since released from jail.  He denies the use of any alcohol or illegal drugs.  Denies access to firearms.  Review of symptoms notable for prominent symptoms of anxiety and depression.  He reports frequent issues with being unsure about the unknown.  He reports a history of debilitating panic attacks that have been in remission for the past 2 years.  He reports a longstanding history of excoriation, typically involving his fingers.  He says he does this in response to anxiety.  Throughout the interview he is noted to ruminate out loud  for several minutes, typically talking about topics of self inadequacy and being unsure if people love him.  He reports a previous history of punching himself, which he says he last did in May of this year.  He reports a more remote history of cutting behaviors.  No prior suicide attempts.  Review of symptoms notable for classic depressive symptoms: Poor sleep, anhedonia, depressed mood, and passive thoughts of not wanting to be alive.  Denies having active thoughts of suicide, denies plans or intentions of harming himself.  Denies even passive thoughts of suicide since admission to the behavioral hospital.  Pertinent negatives include lack of symptomatology for obsessive-compulsive disorder.  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  He reports meticulous attention to detail and the need for rigidity in his everyday schedule.  Despite this, the patient does not have intrusive obsessive thoughts characteristic of OCD such as fears of contamination or disturbing sexual imagery/impulses.  He reports frequent thoughts about self inadequacy which he describes as intrusive and obsessive.  There are no ritualistic compulsions.  Patient reports a history of bipolar affective disorder.  He reports experiencing up episodes that last about 4 hours, just like a sugar high.  He says the longest he has felt mood elevation is 1 to 2 days.  He says these episodes typically involve laughing a lot and going off schedule.  Denies AVH/paranoia.  Denies PTSD symptoms or traumatic exposure previously.  Called the patient's mother at the number listed in the chart, unable to reach her.  Unable to leave voicemail.     Total Time spent with patient: 45 min  Past Psychiatric History: as above  Is the  patient at risk to self? yes Has the patient been a risk to self in the past 6 months? yes Has the patient been a risk to self within the distant past? no Is the patient a risk to others? no Has the patient been a  risk to others in the past 6 months? no Has the patient been a risk to others within the distant past? no  [place] Scale:  Flowsheet Row [facility] Most recent reading at 12/04/2023  6:00 PM ED from 12/04/2023 in [facility] Most recent reading at 12/04/2023 12:52 PM ED from 12/02/2023 in [facility] Emergency Department at [facility] Most recent reading at 12/02/2023 11:24 AM  C-SSRS RISK CATEGORY Low Risk Error: Q3, 4, or 5 should not be populated when Q2 is No Error: Question 6 not populated     Prior Inpatient Therapy: yes Prior Outpatient Therapy: yes  Alcohol Screening:  1. How often do you have a drink containing alcohol?: Never 2. How many drinks containing alcohol do you have on a typical day when you are drinking?: 1 or 2 3. How often do you have six or more drinks on one occasion?: Never AUDIT-C Score: 0 4. How often during the last year have you found that you were not able to stop drinking once you had started?: Never 5. How often during the last year have you failed to do what was normally expected from you because of drinking?: Never 6. How often during the last year have you needed a first drink in the morning to get yourself going after a heavy drinking session?: Never 7. How often during the last year have you had a feeling of guilt of remorse after drinking?: Never 8. How often during the last year have you been unable to remember what happened the night before because you had been drinking?: Never 9. Have you or someone else been injured as a result of your drinking?: No 10. Has a relative or friend or a doctor or another health worker been concerned about your drinking or suggested you cut down?: No Alcohol Use Disorder Identification Test Final Score (AUDIT): 0 Alcohol Brief Interventions/Follow-up: Alcohol education/Brief advice Substance Abuse History  in the last 12 months:  yes Consequences of Substance Abuse: negative Previous Psychotropic Medications: yes Psychological Evaluations: yes Past Medical History:  Past Medical History:  Diagnosis Date   Depression    OCD (obsessive compulsive disorder)    Sleep apnea     Past Surgical History:  Procedure Laterality Date   KNEE SURGERY Bilateral    KNEE SURGERY     Family History:  Family History  Problem Relation Age of Onset   Anxiety disorder Mother    CVA Father    ADD / ADHD Sister    Family Psychiatric  History: none pertinent Tobacco Screening:  Social History   Tobacco Use  Smoking Status Never  Smokeless Tobacco Never    BH Tobacco Counseling     Are you interested in Tobacco Cessation Medications?  N/A, patient does not use tobacco products Counseled patient on smoking cessation:  N/A, patient does not use tobacco products Reason Tobacco Screening Not Completed: Patient Refused Screening       Social History:  Social History   Substance and Sexual Activity  Alcohol Use Yes   Comment: rarely     Social History   Substance and Sexual Activity  Drug Use No  Additional Social History: Separated, when?: Summer, 2025 What types of issues is patient dealing with in the relationship?: false accusation of inappropriate behavior with a male student Additional relationship information: N/A Are you sexually active?: No What is your sexual orientation?: Heterosexual Has your sexual activity been affected by drugs, alcohol, medication, or emotional stress?: No Does patient have children?: No                         Allergies:   No Known Allergies Lab Results:  Results for orders placed or performed during the hospital encounter of 12/04/23 (from the past 48 hours)  Hemoglobin A1c     Status: Abnormal   Collection Time: 12/04/23 12:51 PM  Result Value Ref Range   Hgb A1c MFr Bld 4.5 (L) 4.8 - 5.6 %    Comment: (NOTE) Diagnosis of  Diabetes The following HbA1c ranges recommended by the American Diabetes Association (ADA) may be used as an aid in the diagnosis of diabetes mellitus.  Hemoglobin             Suggested A1C NGSP%              Diagnosis  <5.7                   Non Diabetic  5.7-6.4                Pre-Diabetic  >6.4                   Diabetic  <7.0                   Glycemic control for                       adults with diabetes.     Mean Plasma Glucose 82.45 mg/dL    Comment: Performed at [facility], [address], [place], [place] [address]  Ethanol     Status: None   Collection Time: 12/04/23 12:51 PM  Result Value Ref Range   Alcohol, Ethyl (B) <15 <15 mg/dL    Comment: (NOTE) For medical purposes only. Performed at [facility], [address] [place], [place] [address]   Lipid panel     Status: Abnormal   Collection Time: 12/04/23 12:51 PM  Result Value Ref Range   Cholesterol 178 0 - 200 mg/dL   Triglycerides 830 (H) <150 mg/dL   HDL 33 (L) >59 mg/dL   Total CHOL/HDL Ratio 5.4 RATIO   VLDL 34 0 - 40 mg/dL   LDL Cholesterol 888 (H) 0 - 99 mg/dL    Comment:        Total Cholesterol/HDL:CHD Risk Coronary Heart Disease Risk Table                     Men   Women  1/2 Average Risk   3.4   3.3  Average Risk       5.0   4.4  2 X Average Risk   9.6   7.1  3 X Average Risk  23.4   11.0        Use the calculated Patient Ratio above and the CHD Risk Table to determine the patient's CHD Risk.        ATP III CLASSIFICATION (LDL):  <100     mg/dL   Optimal  899-870  mg/dL   Near  or Above                    Optimal  130-159  mg/dL   Borderline  839-810  mg/dL   High  >809     mg/dL   Very High Performed at [facility], [address], [place], [place] [address]   Vitamin B12     Status: Abnormal   Collection Time: 12/04/23 12:51 PM  Result Value Ref Range   Vitamin B-12 <150 (L) 180 - 914 pg/mL    Comment: (NOTE) This assay is not validated for testing  neonatal or myeloproliferative syndrome specimens for Vitamin B12 levels. Performed at [facility], [address] [place], [place] [address]   TSH     Status: None   Collection Time: 12/04/23 12:51 PM  Result Value Ref Range   TSH 1.865 0.350 - 4.500 uIU/mL    Comment: Performed by a 3rd Generation assay with a functional sensitivity of <=0.01 uIU/mL. Performed at [facility], [address] [place], [place] [address]   Urinalysis, Routine w reflex microscopic -Urine, Clean Catch     Status: None   Collection Time: 12/04/23 12:53 PM  Result Value Ref Range   Color, Urine YELLOW YELLOW   APPearance CLEAR CLEAR   Specific Gravity, Urine 1.021 1.005 - 1.030   pH 5.0 5.0 - 8.0   Glucose, UA NEGATIVE NEGATIVE mg/dL   Hgb urine dipstick NEGATIVE NEGATIVE   Bilirubin Urine NEGATIVE NEGATIVE   Ketones, ur NEGATIVE NEGATIVE mg/dL   Protein, ur NEGATIVE NEGATIVE mg/dL   Nitrite NEGATIVE NEGATIVE   Leukocytes,Ua NEGATIVE NEGATIVE    Comment: Performed at [facility], [address], [place], [place] [address]  POCT Urine Drug Screen - (I-Screen)     Status: Normal   Collection Time: 12/04/23 12:54 PM  Result Value Ref Range   POC Amphetamine UR None Detected NONE DETECTED (Cut Off Level 1000 ng/mL)   POC Secobarbital (BAR) None Detected NONE DETECTED (Cut Off Level 300 ng/mL)   POC Buprenorphine (BUP) None Detected NONE DETECTED (Cut Off Level 10 ng/mL)   POC Oxazepam (BZO) None Detected NONE DETECTED (Cut Off Level 300 ng/mL)   POC Cocaine UR None Detected NONE DETECTED (Cut Off Level 300 ng/mL)   POC Methamphetamine UR None Detected NONE DETECTED (Cut Off Level 1000 ng/mL)   POC Morphine None Detected NONE DETECTED (Cut Off Level 300 ng/mL)   POC Methadone UR None Detected NONE DETECTED (Cut Off Level 300 ng/mL)   POC Oxycodone UR None Detected NONE DETECTED (Cut Off Level 100 ng/mL)   POC Marijuana UR None Detected NONE DETECTED (Cut Off Level 50 ng/mL)  CBC  with Differential/Platelet     Status: None   Collection Time: 12/04/23  1:16 PM  Result Value Ref Range   WBC 8.9 4.0 - 10.5 K/uL   RBC 5.15 4.22 - 5.81 MIL/uL   Hemoglobin 15.7 13.0 - 17.0 g/dL   HCT 54.5 60.9 - 47.9 %   MCV 88.2 80.0 - 100.0 fL   MCH 30.5 26.0 - 34.0 pg   MCHC 34.6 30.0 - 36.0 g/dL   RDW 87.3 88.4 - 84.4 %   Platelets 303 150 - 400 K/uL   nRBC 0.0 0.0 - 0.2 %   Neutrophils Relative % 71 %   Neutro Abs 6.3 1.7 - 7.7 K/uL   Lymphocytes Relative 21 %   Lymphs Abs 1.8 0.7 - 4.0  K/uL   Monocytes Relative 7 %   Monocytes Absolute 0.6 0.1 - 1.0 K/uL   Eosinophils Relative 1 %   Eosinophils Absolute 0.1 0.0 - 0.5 K/uL   Basophils Relative 0 %   Basophils Absolute 0.0 0.0 - 0.1 K/uL   Immature Granulocytes 0 %   Abs Immature Granulocytes 0.02 0.00 - 0.07 K/uL    Comment: Performed at [facility], [address] [place], [place] [address]  Comprehensive metabolic panel     Status: Abnormal   Collection Time: 12/04/23  1:16 PM  Result Value Ref Range   Sodium 133 (L) 135 - 145 mmol/L   Potassium 3.8 3.5 - 5.1 mmol/L   Chloride 102 98 - 111 mmol/L   CO2 24 22 - 32 mmol/L   Glucose, Bld 87 70 - 99 mg/dL    Comment: Glucose reference range applies only to samples taken after fasting for at least 8 hours.   BUN 11 6 - 20 mg/dL   Creatinine, Ser 9.09 0.61 - 1.24 mg/dL   Calcium 9.3 8.9 - 89.6 mg/dL   Total Protein 7.3 6.5 - 8.1 g/dL   Albumin 3.8 3.5 - 5.0 g/dL   AST 15 15 - 41 U/L   ALT 15 0 - 44 U/L   Alkaline Phosphatase 87 38 - 126 U/L   Total Bilirubin 0.6 0.0 - 1.2 mg/dL   GFR, Estimated >39 >39 mL/min    Comment: (NOTE) Calculated using the CKD-EPI Creatinine Equation (2021)    Anion gap 7 5 - 15    Comment: Performed at [facility], [address], [place], [place] [address]  Magnesium     Status: None   Collection Time: 12/04/23  1:16 PM  Result Value Ref Range   Magnesium 1.8 1.7 - 2.4 mg/dL    Comment: Performed at [facility], [address], [place], [place] [address]  Prolactin     Status: None   Collection Time: 12/04/23  1:16 PM  Result Value Ref Range   Prolactin 8.9 3.9 - 22.7 ng/mL    Comment: (NOTE) Performed At: [facility] [address] [place], [place] [id number] Jennette Shorter MD Ey:[id number]   VITAMIN D 25 Hydroxy (Vit-D Deficiency, Fractures)     Status: None   Collection Time: 12/04/23  1:16 PM  Result Value Ref Range   Vit D, 25-Hydroxy 37.90 30 - 100 ng/mL    Comment: (NOTE) Vitamin D deficiency has been defined by the Institute of Medicine  and an Endocrine Society practice guideline as a level of serum 25-OH  vitamin D less than 20 ng/mL (1,2). The Endocrine Society went on to  further define vitamin D insufficiency as a level between 21 and 29  ng/mL (2).  1. IOM (Institute of Medicine). 2010. Dietary reference intakes for  calcium and D. [place]: [employer]. 2. Holick MF, Binkley Mason, Bischoff-Ferrari HA, et al. Evaluation,  treatment, and prevention of vitamin D deficiency: an Endocrine  Society clinical practice guideline, JCEM. 2011 Jul; 96(7): 1911-30.  Performed at [facility], [address], [place], [place] [address]     Blood Alcohol level:  Lab Results  Component Value Date   [facility] <15 12/04/2023   [facility] <15 12/02/2023    Metabolic Disorder Labs:  Lab Results  Component Value Date   HGBA1C 4.5 (L) 12/04/2023   MPG 82.45 12/04/2023   Lab Results  Component Value Date  PROLACTIN 8.9 12/04/2023   Lab Results  Component Value Date   CHOL 178 12/04/2023   TRIG 169 (H) 12/04/2023   HDL 33 (L) 12/04/2023   CHOLHDL 5.4 12/04/2023   VLDL 34 12/04/2023   LDLCALC 111 (H) 12/04/2023    Current Medications: Current Facility-Administered Medications  Medication Dose Route Frequency Provider Last Rate Last Admin   acetaminophen (TYLENOL) tablet 650 mg  650 mg Oral Q6H PRN [name], NP       alum & mag  hydroxide-simeth (MAALOX/MYLANTA) 200-200-20 MG/5ML suspension 30 mL  30 mL Oral Q4H PRN [name], [name], NP       cyanocobalamin (VITAMIN B12) tablet 1,000 mcg  1,000 mcg Oral Daily [name], MD   1,000 mcg at 12/05/23 1258   haloperidol (HALDOL) tablet 5 mg  5 mg Oral TID PRN [name], NP       And   diphenhydrAMINE  (BENADRYL ) capsule 50 mg  50 mg Oral TID PRN [name], NP       haloperidol lactate (HALDOL) injection 5 mg  5 mg Intramuscular TID PRN [name], NP       And   diphenhydrAMINE  (BENADRYL ) injection 50 mg  50 mg Intramuscular TID PRN [name], NP       And   LORazepam (ATIVAN) injection 2 mg  2 mg Intramuscular TID PRN [name], NP       haloperidol lactate (HALDOL) injection 10 mg  10 mg Intramuscular TID PRN [name], NP       And   diphenhydrAMINE  (BENADRYL ) injection 50 mg  50 mg Intramuscular TID PRN [name], NP       And   LORazepam (ATIVAN) injection 2 mg  2 mg Intramuscular TID PRN [name], NP       hydrOXYzine (ATARAX) tablet 25 mg  25 mg Oral TID PRN [name], NP   25 mg at 12/04/23 2051   lamoTRIgine  (LAMICTAL ) tablet 200 mg  200 mg Oral Daily [name], MD   200 mg at 12/05/23 1258   magnesium hydroxide (MILK OF MAGNESIA) suspension 30 mL  30 mL Oral Daily PRN [name], NP       sertraline  (ZOLOFT ) tablet 100 mg  100 mg Oral Daily [name], MD   100 mg at 12/05/23 1258   Followed by   [name] ON 12/07/2023] venlafaxine XR (EFFEXOR-XR) 24 hr capsule 75 mg  75 mg Oral Q breakfast [name], MD       [name] ON 12/06/2023] venlafaxine XR (EFFEXOR-XR) 24 hr capsule 37.5 mg  37.5 mg Oral Q breakfast [name], MD       PTA Medications: Medications Prior to Admission  Medication Sig Dispense Refill Last Dose/Taking   HYDROXYZINE HCL PO Take 25 mg by mouth in the morning and at bedtime.      lamoTRIgine  (LAMICTAL ) 200 MG tablet Take 1 tablet (200 mg total) by mouth daily. 90 tablet  1    lurasidone  (LATUDA ) 40 MG TABS tablet Take 1/2 tablet daily with supper for one week, then increase to 1 tablet daily with supper (Patient not taking: Reported on 12/04/2023) 30 tablet 0    sertraline  (ZOLOFT ) 100 MG tablet Take 2 tablets (200 mg total) by mouth daily. 180 tablet 1     Musculoskeletal: Strength & Muscle Tone: normal Gait & Station: normal Patient leans: NA   Psychiatric Specialty Exam: Physical Exam Constitutional:      Appearance: the patient is not toxic-appearing.  Pulmonary:     Effort: Pulmonary effort is normal.  Neurological:     General: No focal deficit present.     Mental Status: the patient is alert and oriented to person, place, and time.   Review of Systems  Respiratory:  Negative for shortness of breath.   Cardiovascular:  Negative for chest pain.  Gastrointestinal:  Negative for abdominal pain, constipation, diarrhea, nausea and vomiting.  Neurological:  Negative for headaches.      BP 134/83 (BP Location: Right Arm)   Pulse 82   Temp (!) 97.4 F (36.3 C) (Oral)   Resp 20   Ht 5' 7 (1.702 m)   Wt 104.8 kg   SpO2 96%   BMI 36.18 kg/m   General Appearance: Fairly Groomed  Eye Contact:  Good  Speech:  Clear and Coherent  Volume:  Normal  Mood:  Euthymic  Affect:  Congruent  Thought Process:  Coherent  Orientation:  Full (Time, Place, and Person)  Thought Content: Logical   Suicidal Thoughts:  No  Homicidal Thoughts:  No  Memory:  Immediate;   Good  Judgement:  fair  Insight:  fair  Psychomotor Activity:  Normal  Concentration:  Concentration: Good  Recall:  Good  Fund of Knowledge: Good  Language: Good  Akathisia:  No  Handed:  not assessed  AIMS (if indicated): not done  Assets:  Communication Skills Desire for Improvement Financial Resources/Insurance Housing Leisure Time Physical Health  ADL's:  Intact  Cognition: WNL  Sleep:  Fair    Treatment Plan Summary: Daily contact with patient to assess and evaluate  symptoms and progress in treatment and Medication management  Physician Treatment Plan for Primary Diagnosis: Major depressive disorder, recurrent episode, severe (HCC) Long Term Goal(s): Improvement in symptoms so as ready for discharge  Short Term Goals: Ability to identify changes in lifestyle to reduce recurrence of condition will improve  Physician Treatment Plan for Secondary Diagnosis: Principal Problem:   Major depressive disorder, recurrent episode, severe (HCC) Active Problems:   Generalized anxiety disorder   Cluster C personality disorder (HCC)   B12 deficiency   Excoriation (skin-picking) disorder   Long Term Goal(s): Improvement in symptoms so as ready for discharge  Short Term Goals: Ability to verbalize feelings will improve  ASSESSMENT: Current formulation is underlying cluster C personality traits with current major depressive episode caused by significant psychosocial stressor.  B12 deficiency may be contributing.  Excoriation disorder is a concerning manifestation of severe anxiety.   PLAN: Safety and Monitoring:  -- Voluntary admission to inpatient psychiatric unit for safety, stabilization and treatment  -- Daily contact with patient to assess and evaluate symptoms and progress in treatment  -- Patient's case to be discussed in multi-disciplinary team meeting  -- Observation Level : q15 minute checks  -- Vital signs:  q12 hours  -- Precautions: suicide, elopement, and assault  2. Psychiatric Diagnoses and Treatment:   Patient reports consistent adherence for years, even during jail time, to home medications described below. -Cross titrate Zoloft  200 mg daily to Effexor - Restart Zoloft  at 100 mg daily for 2 days, then reduce to 50 mg for 2 days, then discontinue - Start Effexor 37.5 mg daily for 2 days, then increase to 75 mg daily, likely increase higher thereafter - Continue home lamotrigine  200 mg daily  Patient has specifically requested PHP  programming after discharge Arrange follow-up with PHP once discharge date is established    3. Medical Issues Being Addressed:   Labs  review, notable for B12 less than 150, otherwise unremarkable  B12 shot x 1, oral supplementation Needs primary care follow-up, discussed with the patient   Tobacco Use Disorder  -- Nicotine patch 21mg /24 hours ordered  -- Smoking cessation encouraged  4. Discharge Planning:   -- Social work and case management to assist with discharge planning and identification of hospital follow-up needs prior to discharge  -- Estimated LOS: 5-7 days  -- Discharge Concerns: Need to establish a safety plan; Medication compliance and effectiveness  -- Discharge Goals: Return home with outpatient referrals for mental health follow-up including medication management/psychotherapy   I certify that inpatient services furnished can reasonably be expected to improve the patient's condition.    Karleen Kaufmann, MD PGY-4

## 2023-12-05 NOTE — Group Note (Signed)
 LCSW Group Therapy Note   Group Date: 12/05/2023 Start Time: 1100 End Time: 1200   Participation:  did not attend  Type of Therapy:  Group Therapy  Topic: Healing Flames: Navigating Anger with Compassion  Objective:  Foster self-awareness and promote compassion toward oneself and others when dealing with anger.  Goals:  Help participants understand the underlying emotions and needs fueling anger. Provide coping strategies for healthier emotional expression and anger management.  Summary: This session explored anger as a volcano--an explosion driven by deeper feelings and unmet needs. Participants learned to identify anger triggers and underlying emotions, then practiced coping strategies like deep breathing, physical activity, and journaling. The group discussed healthy ways to manage anger before it escalates, using both personal reflection and shared experiences.  Therapeutic Modalities: Cognitive Behavioral Therapy (CBT): Challenging thoughts that fuel anger. Mindfulness: Increasing awareness of emotions and sensations.   Amar Keenum O Capone Schwinn, LCSWA 12/05/2023  12:37 PM

## 2023-12-06 ENCOUNTER — Encounter (HOSPITAL_COMMUNITY): Payer: Self-pay

## 2023-12-06 NOTE — Progress Notes (Signed)
 [facility] MD Progress Note  12/06/2023 10:15 AM Todd Tucker  MRN:  [id number]  Principal Problem: Major depressive disorder, recurrent episode, severe (HCC) Diagnosis: Principal Problem:   Major depressive disorder, recurrent episode, severe (HCC) Active Problems:   Generalized anxiety disorder   Cluster C personality disorder (HCC)   B12 deficiency   Excoriation (skin-picking) disorder    Reason for Admission:  The patient is a 43 year old male with no prior psychiatric admissions, previous diagnoses made by outpatient mental health provider of obsessional thoughts and actions and PTSD. The patient presented voluntarily to the [facility] reporting passive suicidal thoughts. He is currently admitted to the [facility] behavioral health hospital on a voluntary basis.    Information obtained from 24-hour nursing report: No remarkable events   Information Obtained Today During Patient Interview:  The patient reports good sleep, appetite, and mood.  He says that being at the behavioral hospital is not what he was expecting but that he is adjusting okay.  We discussed gratitude journaling and he was able to engage well in the conversation.  He was given a prompt for our interview tomorrow.  He denies experiencing any thoughts of self-harm.  He gave the correct phone number to contact his mother.  [phone], Janice Clemon.  The patient's mother reports that he is currently living with them and he will return to their house after discharge.  She feels that the patient's mental health was not particularly good even before the accusations of sexual misconduct.  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  She emphasizes that the patient has been crying continually throughout his time in jail and since leaving jail on Friday.  She says Javarri is very good at masking his emotions and appearing put together emotionally.  She confirms previous  cutting behaviors but thinks this was years ago.  She denies knowledge of any suicide attempts or previous psychiatric admissions.  I discussed the partial hospitalization program with her.    Total Time spent with patient: 20 min  Past Psychiatric History: as per H and P  Past Medical History:  Past Medical History:  Diagnosis Date   Depression    OCD (obsessive compulsive disorder)    Sleep apnea     Past Surgical History:  Procedure Laterality Date   KNEE SURGERY Bilateral    KNEE SURGERY     Family History:  Family History  Problem Relation Age of Onset   Anxiety disorder Mother    CVA Father    ADD / ADHD Sister    Family Psychiatric  History: as per H and P Social History:  Social History   Substance and Sexual Activity  Alcohol Use Yes   Comment: rarely     Social History   Substance and Sexual Activity  Drug Use No    Social History   Socioeconomic History   Marital status: Married    Spouse name: Not on file   Number of children: Not on file   Years of education: Not on file   Highest education level: Not on file  Occupational History   Not on file  Tobacco Use   Smoking status: Never   Smokeless tobacco: Never  Vaping Use   Vaping status: Never Used  Substance and Sexual Activity   Alcohol use: Yes    Comment: rarely   Drug use: No   Sexual activity: Not on file  Other Topics Concern   Not  on file  Social History Narrative   Not on file   Social Drivers of Health   Financial Resource Strain: Patient Declined (05/27/2022)   Received from [facility]   Overall Financial Resource Strain (CARDIA)    Difficulty of Paying Living Expenses: Patient declined  Food Insecurity: No Food Insecurity (12/04/2023)   Hunger Vital Sign    Worried About Running Out of Food in the Last Year: Never true    Ran Out of Food in the Last Year: Never true  Transportation Needs: No Transportation Needs (12/04/2023)   PRAPARE - Scientist, research (physical sciences) (Medical): No    Lack of Transportation (Non-Medical): No  Physical Activity: Not on file  Stress: Not on file  Social Connections: Unknown (05/17/2022)   Received from [facility]   Social Network    Social Network: Not on file   Additional Social History:                         Sleep: fair  Appetite: fair  Current Medications: Current Facility-Administered Medications  Medication Dose Route Frequency Provider Last Rate Last Admin   acetaminophen (TYLENOL) tablet 650 mg  650 mg Oral Q6H PRN [name], NP       alum & mag hydroxide-simeth (MAALOX/MYLANTA) 200-200-20 MG/5ML suspension 30 mL  30 mL Oral Q4H PRN [name], [name], NP       cyanocobalamin (VITAMIN B12) tablet 1,000 mcg  1,000 mcg Oral Daily [name], MD   1,000 mcg at 12/06/23 0830   haloperidol (HALDOL) tablet 5 mg  5 mg Oral TID PRN [name], NP       And   diphenhydrAMINE  (BENADRYL ) capsule 50 mg  50 mg Oral TID PRN [name], NP       haloperidol lactate (HALDOL) injection 5 mg  5 mg Intramuscular TID PRN [name], NP       And   diphenhydrAMINE  (BENADRYL ) injection 50 mg  50 mg Intramuscular TID PRN [name], NP       And   LORazepam (ATIVAN) injection 2 mg  2 mg Intramuscular TID PRN [name], NP       haloperidol lactate (HALDOL) injection 10 mg  10 mg Intramuscular TID PRN [name], NP       And   diphenhydrAMINE  (BENADRYL ) injection 50 mg  50 mg Intramuscular TID PRN [name], NP       And   LORazepam (ATIVAN) injection 2 mg  2 mg Intramuscular TID PRN [name], NP       hydrOXYzine (ATARAX) tablet 25 mg  25 mg Oral TID PRN [name], NP   25 mg at 12/05/23 2056   lamoTRIgine  (LAMICTAL ) tablet 200 mg  200 mg Oral Daily [name], MD   200 mg at 12/06/23 9170   magnesium hydroxide (MILK OF MAGNESIA) suspension 30 mL  30 mL Oral Daily PRN [name], NP       [START ON 12/07/2023] sertraline  (ZOLOFT ) tablet 50 mg   50 mg Oral Daily [name], MD       [name] ON 12/07/2023] venlafaxine XR (EFFEXOR-XR) 24 hr capsule 75 mg  75 mg Oral Q breakfast [name], MD        Lab Results:  Results for orders placed or performed during the hospital encounter of 12/04/23 (from the past 48 hours)  Hemoglobin A1c     Status: Abnormal   Collection Time: 12/04/23  12:51 PM  Result Value Ref Range   Hgb A1c MFr Bld 4.5 (L) 4.8 - 5.6 %    Comment: (NOTE) Diagnosis of Diabetes The following HbA1c ranges recommended by the American Diabetes Association (ADA) may be used as an aid in the diagnosis of diabetes mellitus.  Hemoglobin             Suggested A1C NGSP%              Diagnosis  <5.7                   Non Diabetic  5.7-6.4                Pre-Diabetic  >6.4                   Diabetic  <7.0                   Glycemic control for                       adults with diabetes.     Mean Plasma Glucose 82.45 mg/dL    Comment: Performed at [facility], [address], [place], [place] [address]  Ethanol     Status: None   Collection Time: 12/04/23 12:51 PM  Result Value Ref Range   Alcohol, Ethyl (B) <15 <15 mg/dL    Comment: (NOTE) For medical purposes only. Performed at [facility], [address], [place], [place] [address]   Lipid panel     Status: Abnormal   Collection Time: 12/04/23 12:51 PM  Result Value Ref Range   Cholesterol 178 0 - 200 mg/dL   Triglycerides 830 (H) <150 mg/dL   HDL 33 (L) >59 mg/dL   Total CHOL/HDL Ratio 5.4 RATIO   VLDL 34 0 - 40 mg/dL   LDL Cholesterol 888 (H) 0 - 99 mg/dL    Comment:        Total Cholesterol/HDL:CHD Risk Coronary Heart Disease Risk Table                     Men   Women  1/2 Average Risk   3.4   3.3  Average Risk       5.0   4.4  2 X Average Risk   9.6   7.1  3 X Average Risk  23.4   11.0        Use the calculated Patient Ratio above and the CHD Risk Table to determine the patient's CHD Risk.        ATP III  CLASSIFICATION (LDL):  <100     mg/dL   Optimal  899-870  mg/dL   Near or Above                    Optimal  130-159  mg/dL   Borderline  839-810  mg/dL   High  >809     mg/dL   Very High Performed at [facility], [address] [place], [place] [address]   Vitamin B12     Status: Abnormal   Collection Time: 12/04/23 12:51 PM  Result Value Ref Range   Vitamin B-12 <150 (L) 180 - 914 pg/mL    Comment: (NOTE) This assay is not validated for testing neonatal or myeloproliferative syndrome specimens for Vitamin B12 levels. Performed at [facility], [address], [place], [place]  [address]   TSH     Status: None   Collection Time: 12/04/23 12:51 PM  Result Value Ref Range   TSH 1.865 0.350 - 4.500 uIU/mL    Comment: Performed by a 3rd Generation assay with a functional sensitivity of <=0.01 uIU/mL. Performed at [facility], [address], [place], [place] [address]   Urinalysis, Routine w reflex microscopic -Urine, Clean Catch     Status: None   Collection Time: 12/04/23 12:53 PM  Result Value Ref Range   Color, Urine YELLOW YELLOW   APPearance CLEAR CLEAR   Specific Gravity, Urine 1.021 1.005 - 1.030   pH 5.0 5.0 - 8.0   Glucose, UA NEGATIVE NEGATIVE mg/dL   Hgb urine dipstick NEGATIVE NEGATIVE   Bilirubin Urine NEGATIVE NEGATIVE   Ketones, ur NEGATIVE NEGATIVE mg/dL   Protein, ur NEGATIVE NEGATIVE mg/dL   Nitrite NEGATIVE NEGATIVE   Leukocytes,Ua NEGATIVE NEGATIVE    Comment: Performed at [facility], [address], [place], [place] [address]  POCT Urine Drug Screen - (I-Screen)     Status: Normal   Collection Time: 12/04/23 12:54 PM  Result Value Ref Range   POC Amphetamine UR None Detected NONE DETECTED (Cut Off Level 1000 ng/mL)   POC Secobarbital (BAR) None Detected NONE DETECTED (Cut Off Level 300 ng/mL)   POC Buprenorphine (BUP) None Detected NONE DETECTED (Cut Off Level 10 ng/mL)   POC Oxazepam (BZO) None Detected NONE DETECTED (Cut  Off Level 300 ng/mL)   POC Cocaine UR None Detected NONE DETECTED (Cut Off Level 300 ng/mL)   POC Methamphetamine UR None Detected NONE DETECTED (Cut Off Level 1000 ng/mL)   POC Morphine None Detected NONE DETECTED (Cut Off Level 300 ng/mL)   POC Methadone UR None Detected NONE DETECTED (Cut Off Level 300 ng/mL)   POC Oxycodone UR None Detected NONE DETECTED (Cut Off Level 100 ng/mL)   POC Marijuana UR None Detected NONE DETECTED (Cut Off Level 50 ng/mL)  CBC with Differential/Platelet     Status: None   Collection Time: 12/04/23  1:16 PM  Result Value Ref Range   WBC 8.9 4.0 - 10.5 K/uL   RBC 5.15 4.22 - 5.81 MIL/uL   Hemoglobin 15.7 13.0 - 17.0 g/dL   HCT 54.5 60.9 - 47.9 %   MCV 88.2 80.0 - 100.0 fL   MCH 30.5 26.0 - 34.0 pg   MCHC 34.6 30.0 - 36.0 g/dL   RDW 87.3 88.4 - 84.4 %   Platelets 303 150 - 400 K/uL   nRBC 0.0 0.0 - 0.2 %   Neutrophils Relative % 71 %   Neutro Abs 6.3 1.7 - 7.7 K/uL   Lymphocytes Relative 21 %   Lymphs Abs 1.8 0.7 - 4.0 K/uL   Monocytes Relative 7 %   Monocytes Absolute 0.6 0.1 - 1.0 K/uL   Eosinophils Relative 1 %   Eosinophils Absolute 0.1 0.0 - 0.5 K/uL   Basophils Relative 0 %   Basophils Absolute 0.0 0.0 - 0.1 K/uL   Immature Granulocytes 0 %   Abs Immature Granulocytes 0.02 0.00 - 0.07 K/uL    Comment: Performed at [facility], [address] [place], [place] [address]  Comprehensive metabolic panel     Status: Abnormal   Collection Time: 12/04/23  1:16 PM  Result Value Ref Range   Sodium 133 (L) 135 - 145 mmol/L   Potassium 3.8 3.5 - 5.1 mmol/L   Chloride 102 98 - 111  mmol/L   CO2 24 22 - 32 mmol/L   Glucose, Bld 87 70 - 99 mg/dL    Comment: Glucose reference range applies only to samples taken after fasting for at least 8 hours.   BUN 11 6 - 20 mg/dL   Creatinine, Ser 9.09 0.61 - 1.24 mg/dL   Calcium 9.3 8.9 - 89.6 mg/dL   Total Protein 7.3 6.5 - 8.1 g/dL   Albumin 3.8 3.5 - 5.0 g/dL   AST 15 15 - 41 U/L   ALT 15 0 - 44 U/L    Alkaline Phosphatase 87 38 - 126 U/L   Total Bilirubin 0.6 0.0 - 1.2 mg/dL   GFR, Estimated >39 >39 mL/min    Comment: (NOTE) Calculated using the CKD-EPI Creatinine Equation (2021)    Anion gap 7 5 - 15    Comment: Performed at [facility], [address] [place], [place] [address]  Magnesium     Status: None   Collection Time: 12/04/23  1:16 PM  Result Value Ref Range   Magnesium 1.8 1.7 - 2.4 mg/dL    Comment: Performed at [facility], [address] [place], [place] [address]  Prolactin     Status: None   Collection Time: 12/04/23  1:16 PM  Result Value Ref Range   Prolactin 8.9 3.9 - 22.7 ng/mL    Comment: (NOTE) Performed At: [facility] [address] [place], [place] [id number] Jennette Shorter MD Ey:[id number]   VITAMIN D 25 Hydroxy (Vit-D Deficiency, Fractures)     Status: None   Collection Time: 12/04/23  1:16 PM  Result Value Ref Range   Vit D, 25-Hydroxy 37.90 30 - 100 ng/mL    Comment: (NOTE) Vitamin D deficiency has been defined by the Institute of Medicine  and an Endocrine Society practice guideline as a level of serum 25-OH  vitamin D less than 20 ng/mL (1,2). The Endocrine Society went on to  further define vitamin D insufficiency as a level between 21 and 29  ng/mL (2).  1. IOM (Institute of Medicine). 2010. Dietary reference intakes for  calcium and D. [place]: [employer]. 2. Holick MF, Binkley Alpha, Bischoff-Ferrari HA, et al. Evaluation,  treatment, and prevention of vitamin D deficiency: an Endocrine  Society clinical practice guideline, JCEM. 2011 Jul; 96(7): 1911-30.  Performed at [facility], [address] [place], [place] [address]     Blood Alcohol level:  Lab Results  Component Value Date   [facility] <15 12/04/2023   ETH <15 12/02/2023    Metabolic Disorder Labs: Lab Results  Component Value Date   HGBA1C 4.5 (L) 12/04/2023   MPG 82.45 12/04/2023   Lab Results  Component Value  Date   PROLACTIN 8.9 12/04/2023   Lab Results  Component Value Date   CHOL 178 12/04/2023   TRIG 169 (H) 12/04/2023   HDL 33 (L) 12/04/2023   CHOLHDL 5.4 12/04/2023   VLDL 34 12/04/2023   LDLCALC 111 (H) 12/04/2023    Physical Findings:   Psychiatric Specialty Exam: Physical Exam Constitutional:      Appearance: the patient is not toxic-appearing.  Pulmonary:     Effort: Pulmonary effort is normal.  Neurological:     General: No focal deficit present.     Mental Status: the patient is alert and oriented to person, place, and time.   Review of Systems  Respiratory:  Negative for shortness of breath.   Cardiovascular:  Negative for  chest pain.  Gastrointestinal:  Negative for abdominal pain, constipation, diarrhea, nausea and vomiting.  Neurological:  Negative for headaches.      BP (!) 127/90 (BP Location: Right Arm)   Pulse 83   Temp (!) 97.5 F (36.4 C) (Oral)   Resp 18   Ht 5' 7 (1.702 m)   Wt 104.8 kg   SpO2 99%   BMI 36.18 kg/m   General Appearance: Fairly Groomed  Eye Contact:  Good  Speech:  Clear and Coherent  Volume:  Normal  Mood:  Euthymic  Affect:  Congruent  Thought Process:  Coherent  Orientation:  Full (Time, Place, and Person)  Thought Content: Logical   Suicidal Thoughts:  No  Homicidal Thoughts:  No  Memory:  Immediate;   Good  Judgement:  fair  Insight:  fair  Psychomotor Activity:  Normal  Concentration:  Concentration: Good  Recall:  Good  Fund of Knowledge: Good  Language: Good  Akathisia:  No  Handed:  not assessed  AIMS (if indicated): not done  Assets:  Communication Skills Desire for Improvement Financial Resources/Insurance Housing Leisure Time Physical Health  ADL's:  Intact  Cognition: WNL  Sleep:  Fair    Problem:   Major depressive disorder, recurrent episode, severe (HCC) Active Problems:   Generalized anxiety disorder   Cluster C personality disorder (HCC)   B12 deficiency   Excoriation (skin-picking)  disorder   Treatment Plan Summary: Daily contact with patient to assess and evaluate symptoms and progress in treatment and Medication management  Patient reports consistent adherence for years, even during jail time, to home medications described below. -Cross titrate Zoloft  200 mg daily to Effexor - Restart Zoloft  at 100 mg daily for 2 days, then reduce to 50 mg for 2 days, then discontinue - Start Effexor 37.5 mg daily for 2 days, then increase to 75 mg daily - Continue home lamotrigine  200 mg daily - B12 supplementation for B12 deficiency - Needs PCP follow-up   PHP scheduled for 10/20 at 10 AM     Karleen Kaufmann, MD PGY-4

## 2023-12-06 NOTE — Progress Notes (Signed)
(  Sleep Hours) - 7.25 (Any PRNs that were needed, meds refused, or side effects to meds)- PRN vistaril  25 mg given at pt request, no meds refused.  (Any disturbances and when (visitation, over night)- None  (Concerns raised by the patient)- None  (SI/HI/AVH)- Denies SI/HI/AVH

## 2023-12-06 NOTE — Progress Notes (Signed)
 Spirituality group facilitated by Elia Rockie Sofia, BCC.  Group Description: Group focused on topic of hope. Patients participated in facilitated discussion around topic, connecting with one another around experiences and definitions for hope. Group members engaged with visual explorer photos, reflecting on what hope looks like for them today. Group engaged in discussion around how their definitions of hope are present today in hospital.  Modalities: Psycho-social ed, Adlerian, Narrative, MI  Patient Progress: Todd Tucker attended group and actively engaged and participated in group conversation and activities.  He both gave and received support.  He asked insightful questions and his comments contributed positively to the group conversation.

## 2023-12-06 NOTE — [facility] Group Notes (Signed)
 Adult Psychoeducational Group Note  Date:  12/06/2023 Time:  8:28 PM  Group Topic/Focus:  Wrap-Up Group:   The focus of this group is to help patients review their daily goal of treatment and discuss progress on daily workbooks.  Participation Level:  Active  Participation Quality:  Appropriate  Affect:  Appropriate  Cognitive:  Appropriate  Insight: Appropriate  Engagement in Group:  Engaged  Modes of Intervention:  Discussion  Additional Comments:  Jen day was a 8. His goal attend group and share Coping skills running and jogging  Lang Donia Law 12/06/2023, 8:28 PM

## 2023-12-06 NOTE — BH IP Treatment Plan (Signed)
 Interdisciplinary Treatment and Diagnostic Plan Update  12/06/2023 Time of Session: 1015AM Todd Tucker MRN: [id number]  Principal Diagnosis: Major depressive disorder, recurrent episode, severe (HCC)  Secondary Diagnoses: Principal Problem:   Major depressive disorder, recurrent episode, severe (HCC) Active Problems:   Generalized anxiety disorder   Cluster C personality disorder (HCC)   B12 deficiency   Excoriation (skin-picking) disorder   Current Medications:  Current Facility-Administered Medications  Medication Dose Route Frequency Provider Last Rate Last Admin   acetaminophen (TYLENOL) tablet 650 mg  650 mg Oral Q6H PRN [name], [name], NP       alum & mag hydroxide-simeth (MAALOX/MYLANTA) 200-200-20 MG/5ML suspension 30 mL  30 mL Oral Q4H PRN [name], [name], NP       cyanocobalamin (VITAMIN B12) tablet 1,000 mcg  1,000 mcg Oral Daily [name], MD   1,000 mcg at 12/06/23 0830   haloperidol (HALDOL) tablet 5 mg  5 mg Oral TID PRN [name], NP       And   diphenhydrAMINE  (BENADRYL ) capsule 50 mg  50 mg Oral TID PRN [name], NP       haloperidol lactate (HALDOL) injection 5 mg  5 mg Intramuscular TID PRN [name], NP       And   diphenhydrAMINE  (BENADRYL ) injection 50 mg  50 mg Intramuscular TID PRN [name], NP       And   LORazepam (ATIVAN) injection 2 mg  2 mg Intramuscular TID PRN [name], NP       haloperidol lactate (HALDOL) injection 10 mg  10 mg Intramuscular TID PRN [name], NP       And   diphenhydrAMINE  (BENADRYL ) injection 50 mg  50 mg Intramuscular TID PRN [name], NP       And   LORazepam (ATIVAN) injection 2 mg  2 mg Intramuscular TID PRN [name], NP       hydrOXYzine (ATARAX) tablet 25 mg  25 mg Oral TID PRN [name], NP   25 mg at 12/05/23 2056   lamoTRIgine  (LAMICTAL ) tablet 200 mg  200 mg Oral Daily [name], MD   200 mg at 12/06/23 9170   magnesium hydroxide (MILK OF MAGNESIA)  suspension 30 mL  30 mL Oral Daily PRN [name], NP       [START ON 12/07/2023] sertraline  (ZOLOFT ) tablet 50 mg  50 mg Oral Daily [name], MD       Todd Tucker ON 12/07/2023] venlafaxine XR (EFFEXOR-XR) 24 hr capsule 75 mg  75 mg Oral Q breakfast [name], MD       PTA Medications: Medications Prior to Admission  Medication Sig Dispense Refill Last Dose/Taking   HYDROXYZINE HCL PO Take 25 mg by mouth in the morning and at bedtime.      lamoTRIgine  (LAMICTAL ) 200 MG tablet Take 1 tablet (200 mg total) by mouth daily. 90 tablet 1    lurasidone  (LATUDA ) 40 MG TABS tablet Take 1/2 tablet daily with supper for one week, then increase to 1 tablet daily with supper (Patient not taking: Reported on 12/04/2023) 30 tablet 0    sertraline  (ZOLOFT ) 100 MG tablet Take 2 tablets (200 mg total) by mouth daily. 180 tablet 1     Patient Stressors: Legal issue   Marital or family conflict   Occupational concerns    Patient Strengths: Average or above average intelligence  Communication skills  General fund of knowledge   Treatment Modalities: Medication Management, Group therapy, Case management,  1 to  1 session with clinician, Psychoeducation, Recreational therapy.   Physician Treatment Plan for Primary Diagnosis: Major depressive disorder, recurrent episode, severe (HCC) Long Term Goal(s):     Short Term Goals:    Medication Management: Evaluate patient's response, side effects, and tolerance of medication regimen.  Therapeutic Interventions: 1 to 1 sessions, Unit Group sessions and Medication administration.  Evaluation of Outcomes: Not Progressing  Physician Treatment Plan for Secondary Diagnosis: Principal Problem:   Major depressive disorder, recurrent episode, severe (HCC) Active Problems:   Generalized anxiety disorder   Cluster C personality disorder (HCC)   B12 deficiency   Excoriation (skin-picking) disorder  Long Term Goal(s):     Short Term Goals:        Medication Management: Evaluate patient's response, side effects, and tolerance of medication regimen.  Therapeutic Interventions: 1 to 1 sessions, Unit Group sessions and Medication administration.  Evaluation of Outcomes: Not Progressing   RN Treatment Plan for Primary Diagnosis: Major depressive disorder, recurrent episode, severe (HCC) Long Term Goal(s): Knowledge of disease and therapeutic regimen to maintain health will improve  Short Term Goals: Ability to remain free from injury will improve, Ability to verbalize frustration and anger appropriately will improve, Ability to demonstrate self-control, Ability to participate in decision making will improve, Ability to verbalize feelings will improve, Ability to disclose and discuss suicidal ideas, Ability to identify and develop effective coping behaviors will improve, and Compliance with prescribed medications will improve  Medication Management: RN will administer medications as ordered by provider, will assess and evaluate patient's response and provide education to patient for prescribed medication. RN will report any adverse and/or side effects to prescribing provider.  Therapeutic Interventions: 1 on 1 counseling sessions, Psychoeducation, Medication administration, Evaluate responses to treatment, Monitor vital signs and CBGs as ordered, Perform/monitor CIWA, COWS, AIMS and Fall Risk screenings as ordered, Perform wound care treatments as ordered.  Evaluation of Outcomes: Not Progressing   LCSW Treatment Plan for Primary Diagnosis: Major depressive disorder, recurrent episode, severe (HCC) Long Term Goal(s): Safe transition to appropriate next level of care at discharge, Engage patient in therapeutic group addressing interpersonal concerns.  Short Term Goals: Engage patient in aftercare planning with referrals and resources, Increase social support, Increase ability to appropriately verbalize feelings, Increase emotional  regulation, Facilitate acceptance of mental health diagnosis and concerns, Facilitate patient progression through stages of change regarding substance use diagnoses and concerns, Identify triggers associated with mental health/substance abuse issues, and Increase skills for wellness and recovery  Therapeutic Interventions: Assess for all discharge needs, 1 to 1 time with Social worker, Explore available resources and support systems, Assess for adequacy in community support network, Educate family and significant other(s) on suicide prevention, Complete Psychosocial Assessment, Interpersonal group therapy.  Evaluation of Outcomes: Not Progressing   Progress in Treatment: Attending groups: Yes. Participating in groups: Yes. Taking medication as prescribed: Yes. Toleration medication: Yes. Family/Significant other contact made: No, will contact:   Todd Tucker (mother) [phone] Patient understands diagnosis: Yes. Discussing patient identified problems/goals with staff: Yes. Medical problems stabilized or resolved: Yes. Denies suicidal/homicidal ideation: Yes. Issues/concerns per patient self-inventory: No.  New problem(s) identified: No, Describe:  none reported   New Short Term/Long Term Goal(s): medication stabilization, elimination of SI thoughts, development of comprehensive mental wellness plan.    Patient Goals:  Developing coping skills for the voices in my head and figure out where I fit in the world. I really want PHP  Discharge Plan or Barriers: Patient recently admitted. CSW will continue  to follow and assess for appropriate referrals and possible discharge planning.    Reason for Continuation of Hospitalization: Anxiety Depression Medication stabilization  Estimated Length of Stay: 5-7 days  Last 3 [place] Suicide Severity Risk Score: Flowsheet Row [facility] (Current[facility] Most recent reading at  12/04/2023  6:00 PM ED from 12/04/2023 in [facility] Most recent reading at 12/04/2023 12:52 PM ED from 12/02/2023 in [facility] Emergency Department at [facility] Most recent reading at 12/02/2023 11:24 AM  C-SSRS RISK CATEGORY Low Risk Error: Q3, 4, or 5 should not be populated when Q2 is No Error: Question 6 not populated    Last PHQ 2/9 Scores:     No data to display          Scribe for Treatment Team: Jenkins LULLA Primer, LCSWA 12/06/2023 1:34 PM

## 2023-12-06 NOTE — Group Note (Signed)
 Date:  12/06/2023 Time:  4:16 PM  Group Topic/Focus:  Personal Choices and Values:   The focus of this group is to help patients assess and explore the importance of values in their lives, how their values affect their decisions, how they express their values and what opposes their expression.    Participation Level:  Active  Participation Quality:  Appropriate  Affect:  Appropriate  Cognitive:  Appropriate  Insight: Appropriate  Engagement in Group:  Engaged  Modes of Intervention:  Discussion and Education    Huel Mall 12/06/2023, 4:16 PM

## 2023-12-06 NOTE — Group Note (Signed)
 Recreation Therapy Group Note   Group Topic:Problem Solving  Group Date: 12/06/2023 Start Time: 0940 End Time: 1010 Facilitators: Caylan Chenard-McCall, LRT,CTRS Location: [address] Dayroom   Group Topic: Communication, Team Building, Problem Solving   Goal Area(s) Addresses:  Patient will effectively work with peer towards shared goal.  Patient will identify skills used to make activity successful.  Patient will identify how skills used during activity can be used to reach post d/c goals.    Behavioral Response:    Intervention: STEM Activity   Activity: Landing Pad. In teams of 3-5, patients were given 12 plastic drinking straws and an equal length of masking tape. Using the materials provided, patients were asked to build a landing pad to catch a golf ball dropped from approximately 5 feet in the air. All materials were required to be used by the team in their design. LRT facilitated post-activity discussion.   Education: Pharmacist, community, Scientist, physiological, Discharge Planning    Education Outcome: Acknowledges education/In group clarification offered/Needs additional education.    Affect/Mood: N/A   Participation Level: Did not attend    Clinical Observations/Individualized Feedback:     Plan: Continue to engage patient in RT group sessions 2-3x/week.   Trek Kimball-McCall, LRT,CTRS 12/06/2023 11:52 AM

## 2023-12-06 NOTE — Progress Notes (Signed)
 D:  Patient's self inventory sheet, patient has fair sleep, sleep medicine helpful.  Good appetite, normal energy level, good concentration.  Rated depression, hopeless and anxiety 3.  Denied withdrawals.  Checked agitation, irritability.  Denied SI.  Denied physical problems.  Denied physical pain.  Goal is be grateful for the opportunity.  No discharge plans. A:  Medications administered per MD orders.  Emotional support and encouragement given patient. R:  Denied SI and HI, contracts for safety.  Denied A/V hallucinations.  Safety maintained with 15 minute checks.

## 2023-12-06 NOTE — [facility] Group Notes (Signed)
 Adult Psychoeducational Group Note  Date:  12/06/2023 Time:  8:30 AM  Group Topic/Focus:  Wrap-Up Group:   The focus of this group is to help patients review their daily goal of treatment and discuss progress on daily workbooks.  Participation Level:  Active  Participation Quality:  Appropriate  Affect:  Appropriate  Cognitive:  Appropriate  Insight: Appropriate  Engagement in Group:  Engaged  Modes of Intervention:  Discussion  Additional Comments:  Chason said his day was a 5. His goal was to begin to love himself. He assume when he cam they will help with groups thru out the day no groups not structure.  Lang Drilling Long 12/06/2023, 8:30 AM

## 2023-12-06 NOTE — Plan of Care (Signed)
 Nurse discussed anxiety, depression and coping skills with patient.

## 2023-12-06 NOTE — Progress Notes (Signed)
 SPIRITUAL CARE AND COUNSELING CONSULT NOTE   VISIT SUMMARY I met with Todd Tucker following spirituality group.  We discussed his next steps and I encouraged him to keep choosing the next right thing.  We also discussed the grief that is coming up for him as he realizes the ways in which he wasn't taking care of himself and what this cost him.  He feels that this hospitalization has given him some time to reset before he starts the partial hospitalization program.    SPIRITUAL ENCOUNTER                                                                                                                                                                      Type of Visit: Initial Care provided to:: Patient Referral source: Chaplain assessment Reason for visit: Routine spiritual support OnCall Visit: No   SPIRITUAL FRAMEWORK  Presenting Themes: Goals in life/care, Coping tools, Courage hope and growth, Community and relationships Community/Connection: Family Strengths: He knows that he has professional skills that will help him to figure out next steps professionally.   GOALS   Self/Personal Goals: He wants to take care of his mental health so that he can begin again with his life. Clinical Care Goals: He plans to do the partial hospitalization program to learn additional coping and self-care skills.   INTERVENTIONS   Spiritual Care Interventions Made: Established relationship of care and support, Compassionate presence, Reflective listening, Meaning making, Bereavement/grief support    INTERVENTION OUTCOMES   Outcomes: Connection to spiritual care, Connection to values and goals of care, Reduced anxiety  SPIRITUAL CARE PLAN   Spiritual Care Issues Still Outstanding: No further spiritual care needs at this time (see row info)    If immediate needs arise, please contact [facility] / Behavioral Health 24 hour on call [phone]   Evins Lamarr Caldron, Chaplain  12/06/2023 4:19 PM

## 2023-12-07 MED ORDER — VENLAFAXINE HCL ER 150 MG PO CP24
150.0000 mg | ORAL_CAPSULE | Freq: Every day | ORAL | Status: DC
  Administered 2023-12-08 – 2023-12-10 (×3): 150 mg via ORAL
  Filled 2023-12-07 (×3): qty 1

## 2023-12-07 NOTE — [facility] Suicide Risk Assessment (Signed)
 [facility] INPATIENT:  Family/Significant Other Suicide Prevention Education  Suicide Prevention Education:  Education Completed; Jannes Cannaday (mother) [phone],  (name of family member/significant other) has been identified by the patient as the family member/significant other with whom the patient will be residing, and identified as the person(s) who will aid the patient in the event of a mental health crisis (suicidal ideations/suicide attempt).  With written consent from the patient, the family member/significant other has been provided the following suicide prevention education, prior to the and/or following the discharge of the patient.  No safety concerns with patient returning home at discharge. No access to weapons or firearms at home. Mother would like to ensure pt is stable on medications before being discharged. Mother aware of PHP program starting on Monday.   The suicide prevention education provided includes the following: Suicide risk factors Suicide prevention and interventions National Suicide Hotline telephone number [facility] assessment telephone number [facility] Emergency Assistance 911 [facility] and/or Residential Mobile Crisis Unit telephone number  Request made of family/significant other to: Remove weapons (e.g., guns, rifles, knives), all items previously/currently identified as safety concern.   Remove drugs/medications (over-the-counter, prescriptions, illicit drugs), all items previously/currently identified as a safety concern.  The family member/significant other verbalizes understanding of the suicide prevention education information provided.  The family member/significant other agrees to remove the items of safety concern listed above.  Jenkins LULLA Primer 12/07/2023, 2:41 PM

## 2023-12-07 NOTE — Plan of Care (Signed)

## 2023-12-07 NOTE — Group Note (Signed)
 Date:  12/07/2023 Time:  9:27 PM  Group Topic/Focus:  Wrap-Up Group:   The focus of this group is to help patients review their daily goal of treatment and discuss progress on daily workbooks.    Participation Level:  Active  Participation Quality:  Appropriate and Attentive  Affect:  Appropriate  Cognitive:  Appropriate  Insight: Appropriate and Good  Engagement in Group:  Engaged  Modes of Intervention:  Discussion  Additional Comments:  Patient stated that he had a good day rated it a 7 out of 10. Patient wants to work on his mental health   Bari Moats 12/07/2023, 9:27 PM

## 2023-12-07 NOTE — Progress Notes (Signed)
 Pt did not attend goals group.

## 2023-12-07 NOTE — Progress Notes (Signed)
 [facility] MD Progress Note  12/07/2023 4:23 PM Todd Tucker  MRN:  [id number]  Principal Problem: Major depressive disorder, recurrent episode, severe (HCC) Diagnosis: Principal Problem:   Major depressive disorder, recurrent episode, severe (HCC) Active Problems:   Generalized anxiety disorder   Cluster C personality disorder (HCC)   B12 deficiency   Excoriation (skin-picking) disorder    Reason for Admission:  The patient is a 43 year old male with no prior psychiatric admissions, previous diagnoses made by outpatient mental health provider of obsessional thoughts and actions and PTSD. The patient presented voluntarily to the [facility] reporting passive suicidal thoughts. He is currently admitted to the [facility] behavioral health hospital on a voluntary basis.    Information obtained from 24-hour nursing report: No remarkable events   Information Obtained Today During Patient Interview:  Today the patient reports a good mood.  He reports appropriate sleep and appetite.  He denies experiencing any thoughts of self-harm.  He says that he has been able to go to groups and that he has found these particularly helpful, especially the medication group.  He was able to work on a psychotherapeutic prompt this provider gave him yesterday.  We worked on a more constructive view of his future and what it will look like when you are doing well.  The patient still has significant rumination that comes out during prolonged discussion.  Discussed cross titration schedule and likely discharge on Sunday.  He was agreeable to both.  He denies having problems with the new medication.  Informed him about virtual PHP admission assessment on Monday.    Total Time spent with patient: 20 min  Past Psychiatric History: as per H and P  Past Medical History:  Past Medical History:  Diagnosis Date   Depression    OCD (obsessive compulsive disorder)    Sleep apnea     Past Surgical  History:  Procedure Laterality Date   KNEE SURGERY Bilateral    KNEE SURGERY     Family History:  Family History  Problem Relation Age of Onset   Anxiety disorder Mother    CVA Father    ADD / ADHD Sister    Family Psychiatric  History: as per H and P Social History:  Social History   Substance and Sexual Activity  Alcohol Use Yes   Comment: rarely     Social History   Substance and Sexual Activity  Drug Use No    Social History   Socioeconomic History   Marital status: Married    Spouse name: Not on file   Number of children: Not on file   Years of education: Not on file   Highest education level: Not on file  Occupational History   Not on file  Tobacco Use   Smoking status: Never   Smokeless tobacco: Never  Vaping Use   Vaping status: Never Used  Substance and Sexual Activity   Alcohol use: Yes    Comment: rarely   Drug use: No   Sexual activity: Not on file  Other Topics Concern   Not on file  Social History Narrative   Not on file   Social Drivers of Health   Financial Resource Strain: Patient Declined (05/27/2022)   Received from [facility]   Overall Financial Resource Strain (CARDIA)    Difficulty of Paying Living Expenses: Patient declined  Food Insecurity: No Food Insecurity (12/04/2023)   Hunger Vital Sign    Worried About Running Out of Food in the  Last Year: Never true    Ran Out of Food in the Last Year: Never true  Transportation Needs: No Transportation Needs (12/04/2023)   PRAPARE - Administrator, Civil Service (Medical): No    Lack of Transportation (Non-Medical): No  Physical Activity: Not on file  Stress: Not on file  Social Connections: Unknown (05/17/2022)   Received from [facility]   Social Network    Social Network: Not on file   Additional Social History:                         Sleep: fair  Appetite: fair  Current Medications: Current Facility-Administered Medications  Medication Dose  Route Frequency Provider Last Rate Last Admin   acetaminophen (TYLENOL) tablet 650 mg  650 mg Oral Q6H PRN [name], NP       alum & mag hydroxide-simeth (MAALOX/MYLANTA) 200-200-20 MG/5ML suspension 30 mL  30 mL Oral Q4H PRN [name], [name], NP       cyanocobalamin (VITAMIN B12) tablet 1,000 mcg  1,000 mcg Oral Daily [name], MD   1,000 mcg at 12/07/23 0848   haloperidol (HALDOL) tablet 5 mg  5 mg Oral TID PRN [name], NP       And   diphenhydrAMINE  (BENADRYL ) capsule 50 mg  50 mg Oral TID PRN [name], NP       haloperidol lactate (HALDOL) injection 5 mg  5 mg Intramuscular TID PRN [name], NP       And   diphenhydrAMINE  (BENADRYL ) injection 50 mg  50 mg Intramuscular TID PRN [name], NP       And   LORazepam (ATIVAN) injection 2 mg  2 mg Intramuscular TID PRN [name], NP       haloperidol lactate (HALDOL) injection 10 mg  10 mg Intramuscular TID PRN [name], NP       And   diphenhydrAMINE  (BENADRYL ) injection 50 mg  50 mg Intramuscular TID PRN [name], NP       And   LORazepam (ATIVAN) injection 2 mg  2 mg Intramuscular TID PRN [name], NP       hydrOXYzine (ATARAX) tablet 25 mg  25 mg Oral TID PRN [name], NP   25 mg at 12/06/23 2056   lamoTRIgine  (LAMICTAL ) tablet 200 mg  200 mg Oral Daily [name], MD   200 mg at 12/07/23 0848   magnesium hydroxide (MILK OF MAGNESIA) suspension 30 mL  30 mL Oral Daily PRN [name], NP       sertraline  (ZOLOFT ) tablet 50 mg  50 mg Oral Daily [name], MD   50 mg at 12/07/23 0848   venlafaxine XR (EFFEXOR-XR) 24 hr capsule 75 mg  75 mg Oral Q breakfast [name], MD   75 mg at 12/07/23 0848    Lab Results:  No results found for this or any previous visit (from the past 48 hours).   Blood Alcohol level:  Lab Results  Component Value Date   [facility] <15 12/04/2023   ETH <15 12/02/2023    Metabolic Disorder Labs: Lab Results  Component Value Date    HGBA1C 4.5 (L) 12/04/2023   MPG 82.45 12/04/2023   Lab Results  Component Value Date   PROLACTIN 8.9 12/04/2023   Lab Results  Component Value Date   CHOL 178 12/04/2023   TRIG 169 (H) 12/04/2023   HDL 33 (L) 12/04/2023   CHOLHDL 5.4 12/04/2023  VLDL 34 12/04/2023   LDLCALC 111 (H) 12/04/2023    Physical Findings:   Psychiatric Specialty Exam: Physical Exam Constitutional:      Appearance: the patient is not toxic-appearing.  Pulmonary:     Effort: Pulmonary effort is normal.  Neurological:     General: No focal deficit present.     Mental Status: the patient is alert and oriented to person, place, and time.   Review of Systems  Respiratory:  Negative for shortness of breath.   Cardiovascular:  Negative for chest pain.  Gastrointestinal:  Negative for abdominal pain, constipation, diarrhea, nausea and vomiting.  Neurological:  Negative for headaches.      BP (!) 136/95 (BP Location: Left Arm)   Pulse 90   Temp 97.6 F (36.4 C) (Oral)   Resp 18   Ht 5' 7 (1.702 m)   Wt 104.8 kg   SpO2 98%   BMI 36.18 kg/m   General Appearance: Fairly Groomed  Eye Contact:  Good  Speech:  Clear and Coherent  Volume:  Normal  Mood:  Euthymic  Affect:  Congruent  Thought Process:  Coherent  Orientation:  Full (Time, Place, and Person)  Thought Content: Logical   Suicidal Thoughts:  No  Homicidal Thoughts:  No  Memory:  Immediate;   Good  Judgement:  fair  Insight:  fair  Psychomotor Activity:  Normal  Concentration:  Concentration: Good  Recall:  Good  Fund of Knowledge: Good  Language: Good  Akathisia:  No  Handed:  not assessed  AIMS (if indicated): not done  Assets:  Communication Skills Desire for Improvement Financial Resources/Insurance Housing Leisure Time Physical Health  ADL's:  Intact  Cognition: WNL  Sleep:  Fair    Problem:   Major depressive disorder, recurrent episode, severe (HCC) Active Problems:   Generalized anxiety disorder   Cluster  C personality disorder (HCC)   B12 deficiency   Excoriation (skin-picking) disorder   Treatment Plan Summary: Daily contact with patient to assess and evaluate symptoms and progress in treatment and Medication management   - Discontinue Zoloft  50 mg daily - Increase Effexor from 75 mg daily to 150 mg daily - Continue home lamotrigine  200 mg daily - B12 supplementation for B12 deficiency - Needs PCP follow-up   PHP scheduled for 10/20 at 10 AM     Karleen Kaufmann, MD PGY-4

## 2023-12-07 NOTE — Progress Notes (Signed)
   12/06/23 2056  Psych Admission Type (Psych Patients Only)  Admission Status Voluntary  Psychosocial Assessment  Patient Complaints Anxiety  Eye Contact Fair  Facial Expression Worried  Affect Anxious  Speech Logical/coherent  Interaction Assertive  Motor Activity Other (Comment) (WDL)  Appearance/Hygiene Unremarkable  Behavior Characteristics Cooperative  Mood Anxious;Depressed  Thought Process  Coherency WDL  Content WDL  Delusions None reported or observed  Perception WDL  Hallucination None reported or observed  Judgment WDL  Confusion None  Danger to Self  Current suicidal ideation? Denies  Agreement Not to Harm Self Yes  Description of Agreement Verbal  Danger to Others  Danger to Others None reported or observed

## 2023-12-07 NOTE — Plan of Care (Signed)
   Problem: Education: Goal: Emotional status will improve Outcome: Progressing Goal: Mental status will improve Outcome: Progressing Goal: Verbalization of understanding the information provided will improve Outcome: Progressing   Problem: Activity: Goal: Interest or engagement in activities will improve Outcome: Progressing

## 2023-12-07 NOTE — Progress Notes (Signed)
(  Sleep Hours) - 11.75 (Any PRNs that were needed, meds refused, or side effects to meds)- PRN vistaril 25 mg given at pt request, no meds refused.  (Any disturbances and when (visitation, over night)- None (Concerns raised by the patient)- None  (SI/HI/AVH)- Denies SI/HI/AVH

## 2023-12-07 NOTE — Group Note (Signed)
 LCSW Group Therapy Note   Group Date: 12/07/2023 Start Time: 1100 End Time: 1200   Participation:  patient was present and actively participated in the discussion.  He was insightful.  Type of Therapy:  Group Therapy  Topic:  Shining from Within:  Confidence and Self-Love Journey  Objective:  To support participants in developing confidence and self-love through self-awareness, self-compassion, and practical skills that nurture personal growth.   Group Goals Encourage self-reflection and self-acceptance by identifying personal strengths and achievements. Teach skills to challenge negative self-talk and replace it with supportive, truthful self-talk. Foster resilience and self-worth through [employer], gratitude, and self-care practices.   Summary:  This group explores the connection between confidence and self-love by guiding participants through reflection, mindset shifts, and practical tools like affirmations, strength recognition, and goal-setting. Activities are designed to promote self-compassion, build emotional resilience, and normalize the slow, patient journey of inner growth.   Therapeutic Modalities Used: Cognitive Behavioral Therapy (CBT): Challenging and reframing unhelpful self-talk. Motivational Interviewing (MI): Encouraging small, achievable goals. Elements of Dialectical Behavioral Therapist (DBT):  Mindfulness and Self-Compassion: Promoting present-moment awareness and kindness toward self.   Todd Tucker O Todd Tucker, LCSWA 12/07/2023  12:24 PM

## 2023-12-07 NOTE — Progress Notes (Signed)
   12/07/23 1200  Psych Admission Type (Psych Patients Only)  Admission Status Voluntary  Psychosocial Assessment  Patient Complaints None  Eye Contact Fair  Facial Expression Anxious  Affect Anxious  Speech Logical/coherent  Interaction Assertive  Motor Activity Other (Comment) (WNL)  Appearance/Hygiene Unremarkable  Behavior Characteristics Cooperative;Calm  Mood Anxious  Thought Process  Coherency WDL  Content WDL  Delusions None reported or observed  Perception WDL  Hallucination None reported or observed  Judgment WDL  Confusion None  Danger to Self  Current suicidal ideation? Denies  Agreement Not to Harm Self Yes  Description of Agreement verbal  Danger to Others  Danger to Others None reported or observed

## 2023-12-08 NOTE — Plan of Care (Signed)

## 2023-12-08 NOTE — Group Note (Signed)
 Date:  12/08/2023 Time:  5:08 PM  Group Topic/Focus: Identifying resources and support systems  Crisis Planning:   The purpose of this group is to help patients create a crisis plan for use upon discharge or in the future, as needed.    Participation Level:  Active  Participation Quality:  Appropriate  Affect:  Appropriate  Cognitive:  Appropriate  Insight: Appropriate  Engagement in Group:  Engaged  Modes of Intervention:  Discussion  Additional Comments:  Pt engaged appropriately in group.  Satvik Parco D Estuardo Frisbee 12/08/2023, 5:08 PM

## 2023-12-08 NOTE — Group Note (Signed)
 Recreation Therapy Group Note   Group Topic:Health and Wellness  Group Date: 12/08/2023 Start Time: 0940 End Time: 1005 Facilitators: Corley Maffeo-McCall, LRT,CTRS Location: [address] Dayroom   Group Topic: Wellness  Goal Area(s) Addresses:  Patient will define components of whole wellness. Patient will verbalize benefit of whole wellness.  Behavioral Response: Engaged  Intervention: Music  Activity: Exercise. Patients took turns leading group in the exercises of their choosing. Patients determined the difficulty of the exercises presented in group. Patients were encouraged not to do any exercise that was to difficult or aggravated any previous injuries. Patients were also encouraged to take breaks or get water as needed.     Education: Wellness, Building control surveyor.   Education Outcome: Acknowledges education/In group clarification offered/Needs additional education.    Affect/Mood: Appropriate   Participation Level: Engaged   Participation Quality: Independent   Behavior: Appropriate   Speech/Thought Process: Focused   Insight: Good   Judgement: Good   Modes of Intervention: Music   Patient Response to Interventions:  Engaged   Education Outcome:  In group clarification offered    Clinical Observations/Individualized Feedback: Pt came in late to group as well. Pt was able to join right in with the exercises. Pt was bright and focused. Pt led group in shoulder rotations and lunges.     Plan: Continue to engage patient in RT group sessions 2-3x/week.   Zach Tietje-McCall, LRT,CTRS 12/08/2023 12:09 PM

## 2023-12-08 NOTE — Progress Notes (Signed)
 [facility] MD Progress Note  12/08/2023 3:28 PM Todd Tucker  MRN:  [id number]  Principal Problem: Major depressive disorder, recurrent episode, severe (HCC) Diagnosis: Principal Problem:   Major depressive disorder, recurrent episode, severe (HCC) Active Problems:   Generalized anxiety disorder   Cluster C personality disorder (HCC)   B12 deficiency   Excoriation (skin-picking) disorder    Reason for Admission:  The patient is a 43 year old male with no prior psychiatric admissions, previous diagnoses made by outpatient mental health provider of obsessional thoughts and actions and PTSD. The patient presented voluntarily to the [facility] reporting passive suicidal thoughts. He is currently admitted to the [facility] behavioral health hospital on a voluntary basis.    Last 24 hours: 136/95, 133/95, other VSS. Safety planning coplmeted by LCSW yesterday. Taking scheduled meds and increased venlafaxine to start today. Hydrox x1 at bedtime.    Information Obtained Today During Patient Interview:  Reports that he has been feeling up and down and has been feeling somewhat down today.  Feels like he has been reflecting on his situation and feels like he is ruminating on things.  He does report that he had good conversations with people today.  Reports that he is looking forward to returning home with his mom and dad and starting PHP.  Reports that he understands that he is going on Sunday and starting PHP on Monday.  Reports that he slept good and that previously he was getting 5 to 6 hours and reports that he feels well rested in the morning.  Reports that he is eating well.  Reports that he is tolerating Effexor without side effects but also does not report any particular benefit at this time.  Reports he looks forward to getting connected with Signe and church.  He is very mood by the evolve so hard that people have to get to know you again.     Past Psychiatric History: no  prior psychiatric admissions, previous diagnoses made by outpatient mental health provider of obsessional thoughts and actions and PTSD. He reports a history of debilitating panic attacks that have been in remission for the past 2 years. He reports a longstanding history of excoriation. Patient reports a history of bipolar affective disorder.  Hx of self harm by punching self. No prior suicide attempts.    Past Medical History:  Past Medical History:  Diagnosis Date   Depression    OCD (obsessive compulsive disorder)    Sleep apnea     Past Surgical History:  Procedure Laterality Date   KNEE SURGERY Bilateral    KNEE SURGERY     Family History:  Family History  Problem Relation Age of Onset   Anxiety disorder Mother    CVA Father    ADD / ADHD Sister    Family Psychiatric  History: no pertinent Social History:  Social History   Substance and Sexual Activity  Alcohol Use Yes   Comment: rarely     Social History   Substance and Sexual Activity  Drug Use No    Social History   Socioeconomic History   Marital status: Married    Spouse name: Not on file   Number of children: Not on file   Years of education: Not on file   Highest education level: Not on file  Occupational History   Not on file  Tobacco Use   Smoking status: Never   Smokeless tobacco: Never  Vaping Use   Vaping status: Never Used  Substance  and Sexual Activity   Alcohol use: Yes    Comment: rarely   Drug use: No   Sexual activity: Not on file  Other Topics Concern   Not on file  Social History Narrative   Not on file   Social Drivers of Health   Financial Resource Strain: Patient Declined (05/27/2022)   Received from [facility]   Overall Financial Resource Strain (CARDIA)    Difficulty of Paying Living Expenses: Patient declined  Food Insecurity: No Food Insecurity (12/04/2023)   Hunger Vital Sign    Worried About Running Out of Food in the Last Year: Never true    Ran Out of Food in  the Last Year: Never true  Transportation Needs: No Transportation Needs (12/04/2023)   PRAPARE - Administrator, Civil Service (Medical): No    Lack of Transportation (Non-Medical): No  Physical Activity: Not on file  Stress: Not on file  Social Connections: Unknown (05/17/2022)   Received from [facility]   Social Network    Social Network: Not on file   Additional Social History: Separated, when?: Summer, 2025 What types of issues is patient dealing with in the relationship?: false accusation of inappropriate behavior with a male student Additional relationship information: N/A Are you sexually active?: No What is your sexual orientation?: Heterosexual Has your sexual activity been affected by drugs, alcohol, medication, or emotional stress?: No Does patient have children?: No   Current Medications: Current Facility-Administered Medications  Medication Dose Route Frequency Provider Last Rate Last Admin   acetaminophen (TYLENOL) tablet 650 mg  650 mg Oral Q6H PRN [name], [name], NP       alum & mag hydroxide-simeth (MAALOX/MYLANTA) 200-200-20 MG/5ML suspension 30 mL  30 mL Oral Q4H PRN [name], [name], NP       cyanocobalamin (VITAMIN B12) tablet 1,000 mcg  1,000 mcg Oral Daily [name], MD   1,000 mcg at 12/08/23 0813   haloperidol (HALDOL) tablet 5 mg  5 mg Oral TID PRN [name], NP       And   diphenhydrAMINE  (BENADRYL ) capsule 50 mg  50 mg Oral TID PRN [name], NP       haloperidol lactate (HALDOL) injection 5 mg  5 mg Intramuscular TID PRN [name], NP       And   diphenhydrAMINE  (BENADRYL ) injection 50 mg  50 mg Intramuscular TID PRN [name], NP       And   LORazepam (ATIVAN) injection 2 mg  2 mg Intramuscular TID PRN [name], NP       haloperidol lactate (HALDOL) injection 10 mg  10 mg Intramuscular TID PRN [name], NP       And   diphenhydrAMINE  (BENADRYL ) injection 50 mg  50 mg Intramuscular TID PRN [name], NP       And   LORazepam (ATIVAN) injection 2 mg  2 mg Intramuscular TID PRN [name], NP       hydrOXYzine (ATARAX) tablet 25 mg  25 mg Oral TID PRN [name], NP   25 mg at 12/07/23 2143   lamoTRIgine  (LAMICTAL ) tablet 200 mg  200 mg Oral Daily [name], MD   200 mg at 12/08/23 0813   magnesium hydroxide (MILK OF MAGNESIA) suspension 30 mL  30 mL Oral Daily PRN [name], NP       venlafaxine XR (EFFEXOR-XR) 24 hr capsule 150 mg  150 mg Oral Q breakfast [name], MD   150 mg  at 12/08/23 0813    Lab Results:  No results found for this or any previous visit (from the past 48 hours).   Blood Alcohol level:  Lab Results  Component Value Date   [facility] <15 12/04/2023   ETH <15 12/02/2023    Metabolic Disorder Labs: Lab Results  Component Value Date   HGBA1C 4.5 (L) 12/04/2023   MPG 82.45 12/04/2023   Lab Results  Component Value Date   PROLACTIN 8.9 12/04/2023   Lab Results  Component Value Date   CHOL 178 12/04/2023   TRIG 169 (H) 12/04/2023   HDL 33 (L) 12/04/2023   CHOLHDL 5.4 12/04/2023   VLDL 34 12/04/2023   LDLCALC 111 (H) 12/04/2023    Physical Findings:   Psychiatric Specialty Exam: Physical Exam Constitutional:      Appearance: the patient is not toxic-appearing.  Pulmonary:     Effort: Pulmonary effort is normal.  Neurological:     General: No focal deficit present.     Mental Status: the patient is alert and oriented to person, place, and time.   Review of Systems  Respiratory:  Negative for shortness of breath.   Cardiovascular:  Negative for chest pain.  Gastrointestinal:  Negative for abdominal pain, constipation, diarrhea, nausea and vomiting.  Neurological:  Negative for headaches.      BP (!) 133/95 (BP Location: Right Arm)   Pulse 75   Temp 97.9 F (36.6 C) (Oral)   Resp 18   Ht 5' 7 (1.702 m)   Wt 104.8 kg   SpO2 98%   BMI 36.18 kg/m   General Appearance: Fairly Groomed  Eye Contact:  Good   Speech:  Clear and Coherent  Volume:  Normal  Mood:  Euthymic  Affect:  Congruent  Thought Process:  Coherent  Orientation:  Full (Time, Place, and Person)  Thought Content: Logical   Suicidal Thoughts:  No  Homicidal Thoughts:  No  Memory:  Immediate;   Good  Judgement:  fair  Insight:  fair  Psychomotor Activity:  Normal  Concentration:  Concentration: Good  Recall:  Good  Fund of Knowledge: Good  Language: Good  Akathisia:  No  Handed:  not assessed  AIMS (if indicated): not done  Assets:  Communication Skills Desire for Improvement Financial Resources/Insurance Housing Leisure Time Physical Health  ADL's:  Intact  Cognition: WNL  Sleep:  Fair     Principal Problem:   Major depressive disorder, recurrent episode, severe (HCC) Active Problems:   Generalized anxiety disorder   Cluster C personality disorder (HCC)   B12 deficiency   Excoriation (skin-picking) disorder    Treatment Plan Summary: Daily contact with patient to assess and evaluate symptoms and progress in treatment and Medication management  12/08/23 Provided options for PCP in discharge instructions. Tolerating increaesd effexor without side effects although just started this morning. Continued plan for Sunday discharge and PHP on Monday.  - Continue Effexor 150 mg daily - Continue home lamotrigine  200 mg daily - B12 supplementation for B12 deficiency - Needs PCP follow-up   PHP scheduled for 10/20 at 10 AM     Justino Cornish, MD PGY-2 Psychiatry Resident 12/08/2023, 3:28 PM

## 2023-12-08 NOTE — Progress Notes (Signed)
 D: Patient is alert, oriented, pleasant, and cooperative. Denies SI, HI, AVH, and verbally contracts for safety. Patient denies physical symptoms/pain.    A: Scheduled medications administered per MD order. Support provided. Patient educated on safety on the unit and medications. Routine safety checks every 15 minutes. Patient stated understanding to tell nurse about any new physical symptoms. Patient understands to tell staff of any needs.     R: No adverse drug reactions noted. Patient remains safe at this time and will continue to monitor.    12/08/23 1000  Psych Admission Type (Psych Patients Only)  Admission Status Voluntary  Psychosocial Assessment  Patient Complaints None  Eye Contact Fair  Facial Expression Animated  Affect Appropriate to circumstance  Speech Logical/coherent  Interaction Assertive  Motor Activity Other (Comment) (WNL)  Appearance/Hygiene Unremarkable  Behavior Characteristics Cooperative;Calm  Mood Pleasant  Thought Process  Coherency WDL  Content WDL  Delusions None reported or observed  Perception WDL  Hallucination None reported or observed  Judgment WDL  Confusion None  Danger to Self  Current suicidal ideation? Denies  Agreement Not to Harm Self Yes  Description of Agreement verbal  Danger to Others  Danger to Others None reported or observed

## 2023-12-08 NOTE — [facility] Group Notes (Signed)
 Adult Psychoeducational Group Note  Date:  12/08/2023 Time:  8:51 PM  Group Topic/Focus:  Wrap-Up Group:   The focus of this group is to help patients review their daily goal of treatment and discuss progress on daily workbooks.  Participation Level:  Active  Participation Quality:  Appropriate  Affect:  Appropriate  Cognitive:  Appropriate  Insight: Appropriate  Engagement in Group:  Engaged  Modes of Intervention:  Discussion  Additional Comments:  Pt told that today was a good day on the unit, the highlight of which was feeling like his medications were working as intended. On the subject of short term goals, Pt mentioned feeling hopeful about an upcoming partial hospitalization program. Pt rated his day an 8 out of 10.  Keilynn Marano Lee 12/08/2023, 8:51 PM

## 2023-12-08 NOTE — Plan of Care (Signed)
  Problem: Activity: Goal: Sleeping patterns will improve Outcome: Progressing   

## 2023-12-09 NOTE — Group Note (Signed)
 [facility] LCSW Group Therapy Note  12/09/2023  10:00-11:00AM  Type of Therapy and Topic:  Group Therapy:  Building Supports and Asking for Help  Participation Level:  Active   Description of Group:  Patients in this group wished to discuss whether it is a weakness to ask for help.  Processing of this question and related matters took place while group facilitator ensured that all patients had an opportunity to speak, responses were on topic, and the direction of the discussion was positive in nature.    Therapeutic Goals: 1)  discuss why asking for help can be a strength rather than a weakness  2)  explore individual patients' experience in asking for assistance  3)  encourage mutual support among group members  4)  demonstrate the importance of adding supports and of setting boundaries   Summary of Patient Progress:  The patient expressed full comprehension of the concepts presented.  He specifically addressed a question in the room about trusting supports and stated that relationships have to develop over time.  Therapeutic Modalities:   Processing Demonstration  Elgie JINNY Crest, LCSW 12/09/2023 1:34 PM

## 2023-12-09 NOTE — Group Note (Signed)
 Date:  12/09/2023 Time:  8:59 PM  Group Topic/Focus:  Wrap-Up Group:   The focus of this group is to help patients review their daily goal of treatment and discuss progress on daily workbooks.    Participation Level:  Active  Participation Quality:  Appropriate  Affect:  Appropriate  Cognitive:  Appropriate  Insight: Improving  Engagement in Group:  Engaged  Modes of Intervention:  Discussion  Additional Comments:  Pt attended the evening wrap-up group. Tech introduced the staff for the evening, reminded group of the evening schedule and reminded them to ask for anything they need. Pt read and discussed a story named [employer]. PT shared his understanding and participated in the open discussion.  Rutherford JINNY Bend 12/09/2023, 8:59 PM

## 2023-12-09 NOTE — Group Note (Signed)
 Date:  12/09/2023 Time:  6:46 PM  Group Topic/Focus:  Making Healthy Choices:   The focus of this group is to help patients identify negative/unhealthy choices they were using prior to admission and identify positive/healthier coping strategies to replace them upon discharge. Self Care:   The focus of this group is to help patients understand the importance of self-care in order to improve or restore emotional, physical, spiritual, interpersonal, and financial health. Gut health: Taught about what the microbiome of the intestines and how it affects our physical and mental health. Taught how to care for it.    Participation Level:  Active  Participation Quality:  Appropriate  Affect:  Appropriate  Cognitive:  Appropriate  Insight: Appropriate  Engagement in Group:  Engaged  Modes of Intervention:  Discussion and Education  Additional Comments:    Juliene CHRISTELLA Huddle 12/09/2023, 6:46 PM

## 2023-12-09 NOTE — Progress Notes (Signed)
 [facility] MD Progress Note  12/09/2023 11:27 AM Todd Tucker  MRN:  [id number]  Principal Problem: Major depressive disorder, recurrent episode, severe (HCC) Diagnosis: Principal Problem:   Major depressive disorder, recurrent episode, severe (HCC) Active Problems:   Generalized anxiety disorder   Cluster C personality disorder (HCC)   B12 deficiency   Excoriation (skin-picking) disorder    Reason for Admission:  The patient is a 43 year old male with no prior psychiatric admissions, previous diagnoses made by outpatient mental health provider of obsessional thoughts and actions and PTSD. The patient presented voluntarily to the [facility] reporting passive suicidal thoughts. He is currently admitted to the [facility] on a voluntary basis.    Last 24 hours: 134/95, otherwise VSS. Slept 9.25 hours. PRN hdyrox at bedtime. No concerns nursing.    Information Obtained Today During Patient Interview:  Reports he is still having ruminations but that he is feeling stable and ready for discharge.  Discussed ways that he is coming to the present moment to help with his ruminations.  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  Talked about ways that he avoids as a defense mechanism.  Provided patient practical tips for morning routine to help with being intention about his avoidance at start of day.  Reports he is tolerating Cymbalta without side effects.  Reports that he is sleeping well and eating well.        Past Psychiatric History: no prior psychiatric admissions, previous diagnoses made by outpatient mental health provider of obsessional thoughts and actions and PTSD. He reports a history of debilitating panic attacks that have been in remission for the past 2 years. He reports a longstanding history of excoriation. Patient reports a history of bipolar affective disorder.  Hx of self harm by punching self. No prior suicide attempts.    Past Medical  History:  Past Medical History:  Diagnosis Date   Depression    OCD (obsessive compulsive disorder)    Sleep apnea     Past Surgical History:  Procedure Laterality Date   KNEE SURGERY Bilateral    KNEE SURGERY     Family History:  Family History  Problem Relation Age of Onset   Anxiety disorder Mother    CVA Father    ADD / ADHD Sister    Family Psychiatric  History: no pertinent Social History:  Social History   Substance and Sexual Activity  Alcohol Use Yes   Comment: rarely     Social History   Substance and Sexual Activity  Drug Use No    Social History   Socioeconomic History   Marital status: Married    Spouse name: Not on file   Number of children: Not on file   Years of education: Not on file   Highest education level: Not on file  Occupational History   Not on file  Tobacco Use   Smoking status: Never   Smokeless tobacco: Never  Vaping Use   Vaping status: Never Used  Substance and Sexual Activity   Alcohol use: Yes    Comment: rarely   Drug use: No   Sexual activity: Not on file  Other Topics Concern   Not on file  Social History Narrative   Not on file   Social Drivers of Health   Financial Resource Strain: Patient Declined (05/27/2022)   Received from [facility]   Overall Financial Resource Strain (CARDIA)    Difficulty of Paying Living Expenses: Patient declined  Food Insecurity:  No Food Insecurity (12/04/2023)   Hunger Vital Sign    Worried About Running Out of Food in the Last Year: Never true    Ran Out of Food in the Last Year: Never true  Transportation Needs: No Transportation Needs (12/04/2023)   PRAPARE - Administrator, Civil Service (Medical): No    Lack of Transportation (Non-Medical): No  Physical Activity: Not on file  Stress: Not on file  Social Connections: Unknown (05/17/2022)   [facility]   Social Network    Social Network: Not on file   Additional Social History: Separated, when?:  Summer, 2025 What types of issues is patient dealing with in the relationship?: false accusation of inappropriate behavior with a male student Additional relationship information: N/A Are you sexually active?: No What is your sexual orientation?: Heterosexual Has your sexual activity been affected by drugs, alcohol, medication, or emotional stress?: No Does patient have children?: No   Current Medications: Current Facility-Administered Medications  Medication Dose Route Frequency Provider Last Rate Last Admin   acetaminophen (TYLENOL) tablet 650 mg  650 mg Oral Q6H PRN [name], [name], NP       alum & mag hydroxide-simeth (MAALOX/MYLANTA) 200-200-20 MG/5ML suspension 30 mL  30 mL Oral Q4H PRN [name], [name], NP       cyanocobalamin (VITAMIN B12) tablet 1,000 mcg  1,000 mcg Oral Daily [name], MD   1,000 mcg at 12/09/23 9180   haloperidol (HALDOL) tablet 5 mg  5 mg Oral TID PRN [name], NP       And   diphenhydrAMINE  (BENADRYL ) capsule 50 mg  50 mg Oral TID PRN [name], NP       haloperidol lactate (HALDOL) injection 5 mg  5 mg Intramuscular TID PRN [name], NP       And   diphenhydrAMINE  (BENADRYL ) injection 50 mg  50 mg Intramuscular TID PRN [name], NP       And   LORazepam (ATIVAN) injection 2 mg  2 mg Intramuscular TID PRN [name], NP       haloperidol lactate (HALDOL) injection 10 mg  10 mg Intramuscular TID PRN [name], NP       And   diphenhydrAMINE  (BENADRYL ) injection 50 mg  50 mg Intramuscular TID PRN [name], NP       And   LORazepam (ATIVAN) injection 2 mg  2 mg Intramuscular TID PRN [name], NP       hydrOXYzine (ATARAX) tablet 25 mg  25 mg Oral TID PRN [name], NP   25 mg at 12/08/23 2136   lamoTRIgine  (LAMICTAL ) tablet 200 mg  200 mg Oral Daily [name], MD   200 mg at 12/09/23 9180   magnesium hydroxide (MILK OF MAGNESIA) suspension 30 mL  30 mL Oral Daily PRN [name], NP        venlafaxine XR (EFFEXOR-XR) 24 hr capsule 150 mg  150 mg Oral Q breakfast [name], MD   150 mg at 12/09/23 9180    Lab Results:  No results found for this or any previous visit (from the past 48 hours).   Blood Alcohol level:  Lab Results  Component Value Date   [facility] <15 12/04/2023   ETH <15 12/02/2023    Metabolic Disorder Labs: Lab Results  Component Value Date   HGBA1C 4.5 (L) 12/04/2023   MPG 82.45 12/04/2023   Lab Results  Component Value Date   PROLACTIN 8.9 12/04/2023  Lab Results  Component Value Date   CHOL 178 12/04/2023   TRIG 169 (H) 12/04/2023   HDL 33 (L) 12/04/2023   CHOLHDL 5.4 12/04/2023   VLDL 34 12/04/2023   LDLCALC 111 (H) 12/04/2023    Physical Findings:   Psychiatric Specialty Exam: Physical Exam Constitutional:      Appearance: the patient is not toxic-appearing.  Pulmonary:     Effort: Pulmonary effort is normal.  Neurological:     General: No focal deficit present.     Mental Status: the patient is alert and oriented to person, place, and time.   Review of Systems  Respiratory:  Negative for shortness of breath.   Cardiovascular:  Negative for chest pain.  Gastrointestinal:  Negative for abdominal pain, constipation, diarrhea, nausea and vomiting.  Neurological:  Negative for headaches.      BP (!) 134/95 (BP Location: Right Arm)   Pulse 77   Temp 97.8 F (36.6 C) (Oral)   Resp 14   Ht 5' 7 (1.702 m)   Wt 104.8 kg   SpO2 97%   BMI 36.18 kg/m   General Appearance: Fairly Groomed  Eye Contact:  Good  Speech:  Clear and Coherent  Volume:  Normal  Mood:  Euthymic  Affect:  Congruent  Thought Process:  Coherent  Orientation:  Full (Time, Place, and Person)  Thought Content: Logical   Suicidal Thoughts:  No  Homicidal Thoughts:  No  Memory:  Immediate;   Good  Judgement:  fair  Insight:  fair  Psychomotor Activity:  Normal  Concentration:  Concentration: Good  Recall:  Good  Fund of Knowledge: Good   Language: Good  Akathisia:  No  Handed:  not assessed  AIMS (if indicated): not done  Assets:  Communication Skills Desire for Improvement Financial Resources/Insurance Housing Leisure Time Physical Health  ADL's:  Intact  Cognition: WNL  Sleep:  Fair     Principal Problem:   Major depressive disorder, recurrent episode, severe (HCC) Active Problems:   Generalized anxiety disorder   Cluster C personality disorder (HCC)   B12 deficiency   Excoriation (skin-picking) disorder    Treatment Plan Summary: Daily contact with patient to assess and evaluate symptoms and progress in treatment and Medication management  12/09/23 Plan to discharge tomorrow. Tolerating medications without side effects. Responsive to brief solution oriented psychotherapy today and should do well in PHP.   - Continue Effexor 150 mg daily - Continue home lamotrigine  200 mg daily - B12 supplementation for B12 deficiency  --PHP scheduled for 10/20 at 10 AM     Justino Cornish, MD PGY-2 Psychiatry Resident 12/09/2023, 11:27 AM

## 2023-12-09 NOTE — Plan of Care (Signed)

## 2023-12-09 NOTE — Progress Notes (Signed)
(  Sleep Hours) - 9.25 (Any PRNs that were needed, meds refused, or side effects to meds)- hydroxyzine  (Any disturbances and when (visitation, over night)- none (Concerns raised by the patient)- none (SI/HI/AVH)- denies

## 2023-12-09 NOTE — Plan of Care (Signed)
   Problem: Education: Goal: Knowledge of [place] [facility] Education information/materials will improve Outcome: Progressing Goal: Emotional status will improve Outcome: Progressing Goal: Mental status will improve Outcome: Progressing Goal: Verbalization of understanding the information provided will improve Outcome: Progressing

## 2023-12-09 NOTE — Group Note (Signed)
 Date:  12/09/2023 Time:  9:14 AM  Group Topic/Focus:  Goals Group:   The focus of this group is to help patients establish daily goals to achieve during treatment and discuss how the patient can incorporate goal setting into their daily lives to aide in recovery. Orientation:   The focus of this group is to educate the patient on the purpose and policies of crisis stabilization and provide a format to answer questions about their admission.  The group details unit policies and expectations of patients while admitted.    Participation Level:  Did Not Attend

## 2023-12-09 NOTE — Progress Notes (Signed)
 D: Patient is alert, oriented, pleasant, and cooperative. Denies SI, HI, AVH, and verbally contracts for safety. Patient reports he slept good last night with sleeping medication. Patient reports his appetite as good, energy level as normal, and concentration as good. Patient rates his depression 7/10, hopelessness 6/10, and anxiety 1/10. Patient denies physical symptoms/pain.    A: Scheduled medications administered per MD order. Support provided. Patient educated on safety on the unit and medications. Routine safety checks every 15 minutes. Patient stated understanding to tell nurse about any new physical symptoms. Patient understands to tell staff of any needs.     R: No adverse drug reactions noted. Patient remains safe at this time and will continue to monitor.    12/09/23 0900  Psych Admission Type (Psych Patients Only)  Admission Status Voluntary  Psychosocial Assessment  Patient Complaints None  Eye Contact Fair  Facial Expression Animated  Affect Appropriate to circumstance  Speech Logical/coherent  Interaction Assertive  Motor Activity Other (Comment) (WNL)  Appearance/Hygiene Unremarkable  Behavior Characteristics Cooperative;Calm  Mood Pleasant  Thought Process  Coherency WDL  Content WDL  Delusions None reported or observed  Perception WDL  Hallucination None reported or observed  Judgment WDL  Confusion None  Danger to Self  Current suicidal ideation? Denies  Agreement Not to Harm Self Yes  Description of Agreement verbal  Danger to Others  Danger to Others None reported or observed

## 2023-12-10 ENCOUNTER — Other Ambulatory Visit (HOSPITAL_BASED_OUTPATIENT_CLINIC_OR_DEPARTMENT_OTHER): Payer: Self-pay

## 2023-12-10 DIAGNOSIS — F332 Major depressive disorder, recurrent severe without psychotic features: Principal | ICD-10-CM

## 2023-12-10 DIAGNOSIS — F411 Generalized anxiety disorder: Secondary | ICD-10-CM

## 2023-12-10 DIAGNOSIS — F6089 Other specific personality disorders: Secondary | ICD-10-CM

## 2023-12-10 DIAGNOSIS — E538 Deficiency of other specified B group vitamins: Secondary | ICD-10-CM

## 2023-12-10 DIAGNOSIS — F424 Excoriation (skin-picking) disorder: Secondary | ICD-10-CM

## 2023-12-10 MED ORDER — CYANOCOBALAMIN 1000 MCG PO TABS
1000.0000 ug | ORAL_TABLET | Freq: Every day | ORAL | 0 refills | Status: AC
Start: 2023-12-10 — End: ?
  Filled 2023-12-10: qty 30, 30d supply, fill #0
  Filled 2023-12-11: qty 100, 100d supply, fill #0

## 2023-12-10 MED ORDER — HYDROXYZINE HCL 25 MG PO TABS
25.0000 mg | ORAL_TABLET | Freq: Three times a day (TID) | ORAL | 0 refills | Status: DC | PRN
  Filled 2023-12-10: qty 45, 15d supply, fill #0

## 2023-12-10 MED ORDER — VENLAFAXINE HCL ER 150 MG PO CP24
150.0000 mg | ORAL_CAPSULE | Freq: Every day | ORAL | 0 refills | Status: DC
  Filled 2023-12-10: qty 30, 30d supply, fill #0

## 2023-12-10 MED ORDER — LAMOTRIGINE 200 MG PO TABS
200.0000 mg | ORAL_TABLET | Freq: Every day | ORAL | 0 refills | Status: DC
  Filled 2023-12-10 – 2023-12-11 (×2): qty 30, 30d supply, fill #0

## 2023-12-10 NOTE — Progress Notes (Signed)
 Todd Tucker  D/C'd Home per MD order.  Discussed with the patient and all questions fully answered.  An After Visit Summary was printed and given to the patient. Patient received prescription.  D/c education completed with patient including follow up instructions, medication list, d/c activities limitations if indicated, with other d/c instructions as indicated by MD - patient able to verbalize understanding, all questions fully answered.   Patient instructed to return to ED, call 911, or call MD for any changes in condition. Patient denied SI/HI/AVH, and suicide safety plan form signed at discharge.   Patient escorted to the main entrance. , and D/C home via private auto.  Joaquin MALVA Doing 12/10/2023 9:06 AM

## 2023-12-10 NOTE — [facility] Suicide Risk Assessment (Signed)
 Suicide Risk Assessment  Discharge Assessment    [facility] ([place] Discharge Suicide Risk Assessment   Principal Problem: Major depressive disorder, recurrent episode, severe (HCC) Discharge Diagnoses: Principal Problem:   Major depressive disorder, recurrent episode, severe (HCC) Active Problems:   Generalized anxiety disorder   Cluster C personality disorder (HCC)   B12 deficiency   Excoriation (skin-picking) disorder  The patient is a 43 year old male with no prior psychiatric admissions, previous diagnoses made by outpatient mental health provider of obsessional thoughts and actions and PTSD. The patient presented voluntarily to the [facility] reporting passive suicidal thoughts. He is currently admitted to the [facility] behavioral health hospital on a voluntary basis.   On the day of discharge, patient denies any HI SI, AVH.  Reports he is tolerating his medication without side effects.  Reports that he is sleeping well.  Ports that he is eating well.  Reports that his mood and anxiety are stable.  Reports he will follow-up with PHP tomorrow.  Reports that he was stay with his parents at discharge.  Total Time spent with patient: 30 minutes  Musculoskeletal: Strength & Muscle Tone: within normal limits Gait & Station: normal Patient leans: N/A  Psychiatric Specialty Exam  Presentation  General Appearance:  Appropriate for Environment  Eye Contact: Good  Speech: Normal Rate  Speech Volume: Normal  Handedness: Right   Mood and Affect  Mood: Euthymic  Duration of Depression Symptoms: Greater than two weeks  Affect: Appropriate; Congruent; Full Range   Thought Process  Thought Processes: Coherent; Linear  Descriptions of Associations:Intact  Orientation:Full (Time, Place and Person)  Thought Content:Logical  History of Schizophrenia/Schizoaffective disorder:No  Duration of Psychotic Symptoms:No data  recorded Hallucinations:Hallucinations: None  Ideas of Reference:None  Suicidal Thoughts:Suicidal Thoughts: No  Homicidal Thoughts:Homicidal Thoughts: No   Sensorium  Memory: Immediate Good; Recent Good; Remote Good  Judgment: Good  Insight: Good   Executive Functions  Concentration: Good  Attention Span: Good  Recall: Good  Fund of Knowledge: Good  Language: Good   Psychomotor Activity  Psychomotor Activity: Psychomotor Activity: Normal   Assets  Assets: Desire for Improvement; Housing; Social Support; Transportation; Communication Skills   Sleep  Sleep: Sleep: Good  Estimated Sleeping Duration (Last 24 Hours): 5.25-7.75 hours  Physical Exam: Physical Exam Vitals and nursing note reviewed.  HENT:     Head: Normocephalic and atraumatic.  Pulmonary:     Effort: Pulmonary effort is normal.  Neurological:     General: No focal deficit present.     Mental Status: He is alert.    Review of Systems  Constitutional:  Negative for fever.  Cardiovascular:  Negative for chest pain and palpitations.  Gastrointestinal:  Negative for constipation, diarrhea, nausea and vomiting.  Neurological:  Negative for dizziness, weakness and headaches.   Blood pressure (!) 139/99, pulse 79, temperature 97.9 F (36.6 C), temperature source Oral, resp. rate 18, height 5' 7 (1.702 m), weight 104.8 kg, SpO2 99%. Body mass index is 36.18 kg/m.  Mental Status Per Nursing Assessment::   On Admission:  NA  Demographic Factors:  Male and Unemployed, possible separation from spouse  Loss Factors: Decrease in vocational status, Loss of significant relationship, and Legal issues  Historical Factors: NA  Risk Reduction Factors:   Sense of responsibility to family, Living with another person, especially a relative, Positive social support, Positive therapeutic relationship, and Positive coping skills or problem solving skills  Continued Clinical Symptoms:  Mood  is stable. Anxiety at a manageable level.  Denying any SI including passive SI.   Cognitive Features That Contribute To Risk:  None    Suicide Risk:  Mild:  There are no identifiable suicide plans, no associated intent, mild dysphoria and related symptoms, good self-control (both objective and subjective assessment), few other risk factors, and identifiable protective factors, including available and accessible social support. Will continue to have close mental health follow up with PHP. Living with parents at discharge.     Follow-up Information     [facility] Follow up.   Why: You may also go to this provider for an assessment, to receive individual and/or group therapy services Contact information: [address] [place] [address] [phone]         [facility]. Go on 12/11/2023.   Specialty: Behavioral Health Why: You are scheduled for an assessment for the Partial Hospitalization Program on Monday, 10/20 at 10:00 am. This assessment appointment will last approximately 1-1.5 hours and will be virtual via MyChart. PHP is group therapy that meets in person Mon-Thur from 9am-2pm and Friday from 9am-1pm and lasts for approximately 2-3 weeks. After your assessment on 10/20, you will discuss your PHP start date. If you need to cancel or reschedule this appointment, please call [phone] Contact information: [address] [place]  [phone]        [facility]. Schedule an appointment as soon as possible for a visit.   Why: CALL TO MAKE APPOINTMENT TO GET A PRIMARY CARE DOCTOR. (OPTION 1) Contact information: [address] [place]  [phone]        [facility]. Schedule an appointment as soon as possible for a visit.   Why: CALL TO MAKE APPOINTMENT TO GET A PRIMARY CARE DOCTOR. (OPTION 2) Contact information: [address] [place]  [phone]        [facility]. Schedule an appointment as soon as possible for a visit.   Why: CALL TO MAKE APPOINTMENT TO GET A PRIMARY CARE DOCTOR. (OPTION 3) Contact information: [address] [employer] [address] [place]  [address] [phone]                Plan Of Care/Follow-up recommendations:  Activity: as tolerated  Diet: heart healthy  Other: -Follow-up with your outpatient psychiatric provider -instructions on appointment date, time, and address (location) are provided to you in discharge paperwork.  -Take your psychiatric medications as prescribed at discharge - instructions are provided to you in the discharge paperwork  -Follow-up with outpatient primary care doctor and other specialists -for management of chronic medical disease, including: b12 deficiency  -Testing: Follow-up with outpatient provider for abnormal lab results: b12 deficiency  -Recommend abstinence from alcohol, tobacco, and other illicit drug use at discharge.   -If your psychiatric symptoms recur, worsen, or if you have side effects to your psychiatric medications, call your outpatient psychiatric provider, 911, [phone] or go to the nearest emergency department.  -If suicidal thoughts recur, call your outpatient psychiatric provider, 911, [phone] or go to the nearest emergency department.   Justino Cornish, MD 12/10/2023, 8:23 AM

## 2023-12-10 NOTE — Progress Notes (Addendum)
  [facility] Adult Case Management Discharge Plan :  Will you be returning to the same living situation after discharge:  Yes,  [address] [place]  At discharge, do you have transportation home?: Yes,  The patient mom will provide transport Do you have the ability to pay for your medications: Yes,  Insurance  Release of information consent forms completed and in the chart;  Patient's signature needed at discharge.  Patient to Follow up at:  Follow-up Information     [facility] Follow up.   Why: You may also go to this provider for an assessment, to receive individual and/or group therapy services Contact information: [address] [place] [address] [phone]         [facility]. Go on 12/11/2023.   Specialty: Behavioral Health Why: You are scheduled for an assessment for the Partial Hospitalization Program on Monday, 10/20 at 10:00 am. This assessment appointment will last approximately 1-1.5 hours and will be virtual via MyChart. PHP is group therapy that meets in person Mon-Thur from 9am-2pm and Friday from 9am-1pm and lasts for approximately 2-3 weeks. After your assessment on 10/20, you will discuss your PHP start date. If you need to cancel or reschedule this appointment, please call [phone] Contact information: [address] [place]  [address] [phone]        [facility]. Schedule an appointment as soon as possible for a visit.   Why: CALL TO MAKE APPOINTMENT TO GET A PRIMARY CARE DOCTOR. (OPTION 1) Contact information: [address] [place]  [phone]        [facility]. Schedule an appointment as soon as possible for a visit.   Why: CALL TO MAKE APPOINTMENT TO GET A PRIMARY CARE DOCTOR. (OPTION 2) Contact information: [address] [employer] [place]  [phone]        [facility]. Schedule an appointment as soon as possible for a visit.   Why: CALL TO MAKE APPOINTMENT TO GET A PRIMARY CARE DOCTOR. (OPTION 3) Contact information: [address] [employer] [address]   [address] [phone]                Next level of care provider has access to [facility] Link:yes  Safety Planning and Suicide Prevention discussed: Yes,  Completed with Jannes Alcocer (mother) [phone] on 12/07/23     Has patient been referred to the Quitline?: Patient refused referral for treatment  Patient has been referred for addiction treatment: Yes, the patient will follow up with an outpatient provider for substance use disorder. Psychiatrist/APP: appointment made and Therapist: appointment made  Roselyn GORMAN Lento, LCSW 12/10/2023, 9:04 AM

## 2023-12-10 NOTE — Discharge Summary (Signed)
 Physician Discharge Summary Note  Patient:  Todd Tucker is an 43 y.o., male MRN:  [id number] DOB:  [birth year] Patient phone:  [phone] (home)  Patient address:   [address] [place] [address],  Total Time spent with patient: 30 minutes  Date of Admission:  12/04/2023 Date of Discharge: 12/10/2023  The patient is a 43 year old male with no prior psychiatric admissions, previous diagnoses made by outpatient mental health provider of obsessional thoughts and actions and PTSD. The patient presented voluntarily to the [facility] reporting passive suicidal thoughts. He is currently admitted to the [facility] behavioral health hospital on a voluntary basis.   Principal Problem: Major depressive disorder, recurrent episode, severe (HCC) Discharge Diagnoses: Principal Problem:   Major depressive disorder, recurrent episode, severe (HCC) Active Problems:   Generalized anxiety disorder   Cluster C personality disorder (HCC)   B12 deficiency   Excoriation (skin-picking) disorder   Past Psychiatric History: no prior psychiatric admissions, previous diagnoses made by outpatient mental health provider of obsessional thoughts and actions and PTSD. He reports a history of debilitating panic attacks that have been in remission for the past 2 years. He reports a longstanding history of excoriation. Patient reports a history of bipolar affective disorder.  Hx of self harm by punching self. No prior suicide attempts.   Past Medical History:  Past Medical History:  Diagnosis Date   Depression    OCD (obsessive compulsive disorder)    Sleep apnea     Past Surgical History:  Procedure Laterality Date   KNEE SURGERY Bilateral    KNEE SURGERY     Family History:  Family History  Problem Relation Age of Onset   Anxiety disorder Mother    CVA Father    ADD / ADHD Sister    Family Psychiatric  History: no pertinent Social History:  Social History   Substance and  Sexual Activity  Alcohol Use Yes   Comment: rarely     Social History   Substance and Sexual Activity  Drug Use No    Social History   Socioeconomic History   Marital status: Married    Spouse name: Not on file   Number of children: Not on file   Years of education: Not on file   Highest education level: Not on file  Occupational History   Not on file  Tobacco Use   Smoking status: Never   Smokeless tobacco: Never  Vaping Use   Vaping status: Never Used  Substance and Sexual Activity   Alcohol use: Yes    Comment: rarely   Drug use: No   Sexual activity: Not on file  Other Topics Concern   Not on file  Social History Narrative   Not on file   Social Drivers of Health   Financial Resource Strain: Patient Declined (05/27/2022)   Received from [facility]   Overall Financial Resource Strain (CARDIA)    Difficulty of Paying Living Expenses: Patient declined  Food Insecurity: No Food Insecurity (12/04/2023)   Hunger Vital Sign    Worried About Running Out of Food in the Last Year: Never true    Ran Out of Food in the Last Year: Never true  Transportation Needs: No Transportation Needs (12/04/2023)   PRAPARE - Administrator, Civil Service (Medical): No    Lack of Transportation (Non-Medical): No  Physical Activity: Not on file  Stress: Not on file  Social Connections: Unknown (05/17/2022)   Received from [facility]  Social Network    Social Network: Not on file    Hospital course: Patient was initially hospitalized in the context of loss of job and possible separation from wife in the setting of legal issues.  Patient has lots of issues with avoidance and started to except the changes in his life during this hospitalization.  Given the extent of his psychosocial changes discussed PHP with patient he was agreeable and will start on 10/20-week.  Patient was continued on lamotrigine  but was changed from Zoloft  to Effexor given lack of efficacy on Zoloft .   Patient was somewhat shallow during conversations and therapeutic sessions but overall was doing well and denies suicidal thoughts throughout the hospitalization he was very future oriented and accepting of help and euthymic appearing on interactions with him.  During the patient's hospitalization, patient had extensive initial psychiatric evaluation, and follow-up psychiatric evaluations every day.  Psychiatric diagnoses provided upon initial assessment:  Principal Problem:   Major depressive disorder, recurrent episode, severe (HCC) Active Problems:   Generalized anxiety disorder   Cluster C personality disorder (HCC)   B12 deficiency   Excoriation (skin-picking) disorder   Psychiatric medications: Tapered off Zoloft  due to lack of efficacy Started venlafaxine and titrate to 150 mg once daily for MDD, GAD Continued lamotrigine  200 mg once daily for MDD Continue hydroxyzine as needed for anxiety and sleep Started cyanocobalamin 1000 mg once daily for B12 deficiency  Patient's care was discussed during the interdisciplinary team meeting every day during the hospitalization.  The patient is not having side effects to prescribed psychiatric medication.  Gradually, patient started adjusting to milieu. The patient was evaluated each day by a clinical provider to ascertain response to treatment. Improvement was noted by the patient's report of decreasing symptoms, improved sleep and appetite, affect, medication tolerance, behavior, and participation in unit programming.  Patient was asked each day to complete a self inventory noting mood, mental status, pain, new symptoms, anxiety and concerns.   Symptoms were reported as significantly decreased or resolved completely by discharge.  The patient reports that their mood is stable.  The patient denied having suicidal thoughts for more than 48 hours prior to discharge.  Patient denies having homicidal thoughts.  Patient denies having auditory  hallucinations.  Patient denies any visual hallucinations or other symptoms of psychosis.  The patient was motivated to continue taking medication with a goal of continued improvement in mental health.   The patient reports their target psychiatric symptoms of depression, anxiety responded well to the psychiatric medications, and the patient reports overall benefit other psychiatric hospitalization. Supportive psychotherapy was provided to the patient. The patient also participated in regular group therapy while hospitalized. Coping skills, problem solving as well as relaxation therapies were also part of the unit programming.  Labs were reviewed with the patient, and abnormal results were discussed with the patient.  The patient is able to verbalize their individual safety plan to this provider.  # It is recommended to the patient to continue psychiatric medications as prescribed, after discharge from the hospital.    # It is recommended to the patient to follow up with your outpatient psychiatric provider and PCP.  # It was discussed with the patient, the impact of alcohol, drugs, tobacco have been there overall psychiatric and medical wellbeing, and total abstinence from substance use was recommended the patient.ed.  # Prescriptions provided or sent directly to preferred pharmacy at discharge. Patient agreeable to plan. Given opportunity to ask questions. Appears to feel comfortable  with discharge.    # In the event of worsening symptoms, the patient is instructed to call the crisis hotline, 911 and or go to the nearest ED for appropriate evaluation and treatment of symptoms. To follow-up with primary care provider for other medical issues, concerns and or health care needs  # Patient was discharged home with parents with a plan to follow up as noted below.    On the day of discharge, patient denies any HI SI, AVH. Reports he is tolerating his medication without side effects. Reports that  he is sleeping well. Ports that he is eating well. Reports that his mood and anxiety are stable. Reports he will follow-up with PHP tomorrow. Reports that he was stay with his parents at discharge.    Musculoskeletal: Strength & Muscle Tone: within normal limits Gait & Station: normal Patient leans: N/A   Psychiatric Specialty Exam:  Presentation  General Appearance:  Appropriate for Environment  Eye Contact: Good  Speech: Normal Rate  Speech Volume: Normal  Handedness: Right   Mood and Affect  Mood: Euthymic  Affect: Appropriate; Congruent; Full Range   Thought Process  Thought Processes: Coherent; Linear  Descriptions of Associations:Intact  Orientation:Full (Time, Place and Person)  Thought Content:Logical  History of Schizophrenia/Schizoaffective disorder:No  Duration of Psychotic Symptoms:No data recorded Hallucinations:Hallucinations: None  Ideas of Reference:None  Suicidal Thoughts:Suicidal Thoughts: No  Homicidal Thoughts:Homicidal Thoughts: No   Sensorium  Memory: Immediate Good; Recent Good; Remote Good  Judgment: Good  Insight: Good   Executive Functions  Concentration: Good  Attention Span: Good  Recall: Good  Fund of Knowledge: Good  Language: Good   Psychomotor Activity  Psychomotor Activity: Psychomotor Activity: Normal   Assets  Assets: Desire for Improvement; Housing; Social Support; Transportation; Communication Skills   Sleep  Sleep: Sleep: Good  Estimated Sleeping Duration (Last 24 Hours): 5.25-7.75 hours   Physical Exam: Physical Exam Vitals and nursing note reviewed.  HENT:     Head: Normocephalic and atraumatic.  Pulmonary:     Effort: Pulmonary effort is normal.  Neurological:     General: No focal deficit present.     Mental Status: He is alert.    Review of Systems  Constitutional:  Negative for fever.  Cardiovascular:  Negative for chest pain and palpitations.   Gastrointestinal:  Negative for constipation, diarrhea, nausea and vomiting.  Neurological:  Negative for dizziness, weakness and headaches.   Blood pressure (!) 139/99, pulse 79, temperature 97.9 F (36.6 C), temperature source Oral, resp. rate 18, height 5' 7 (1.702 m), weight 104.8 kg, SpO2 99%. Body mass index is 36.18 kg/m.   Social History   Tobacco Use  Smoking Status Never  Smokeless Tobacco Never   Tobacco Cessation:  N/A, patient does not currently use tobacco products   Blood Alcohol level:  Lab Results  Component Value Date   [facility] <15 12/04/2023   ETH <15 12/02/2023    Metabolic Disorder Labs:  Lab Results  Component Value Date   HGBA1C 4.5 (L) 12/04/2023   MPG 82.45 12/04/2023   Lab Results  Component Value Date   PROLACTIN 8.9 12/04/2023   Lab Results  Component Value Date   CHOL 178 12/04/2023   TRIG 169 (H) 12/04/2023   HDL 33 (L) 12/04/2023   CHOLHDL 5.4 12/04/2023   VLDL 34 12/04/2023   LDLCALC 111 (H) 12/04/2023    See Psychiatric Specialty Exam and Suicide Risk Assessment completed by Attending Physician prior to discharge.  Discharge destination:  Home  with family  Is patient on multiple antipsychotic therapies at discharge:  No      Allergies as of 12/10/2023   No Known Allergies      Medication List     STOP taking these medications    lurasidone  40 MG Tabs tablet Commonly known as: Latuda    sertraline  100 MG tablet Commonly known as: ZOLOFT        TAKE these medications      Indication  cyanocobalamin 1000 MCG tablet Take 1 tablet (1,000 mcg total) by mouth daily.  Indication: Inadequate Vitamin B12   hydrOXYzine 25 MG tablet Commonly known as: ATARAX Take 1 tablet (25 mg total) by mouth 3 (three) times daily as needed for anxiety. And for sleep as needed. What changed:  medication strength when to take this reasons to take this additional instructions  Indication: Feeling Anxious   lamoTRIgine  200 MG  tablet Commonly known as: LAMICTAL  Take 1 tablet (200 mg total) by mouth daily.  Indication: Depressive Phase of Manic-Depression   venlafaxine XR 150 MG 24 hr capsule Commonly known as: EFFEXOR-XR Take 1 capsule (150 mg total) by mouth daily with breakfast.  Indication: Generalized Anxiety Disorder, Major Depressive Disorder        Follow-up Information     [facility] Follow up.   Why: You may also go to this provider for an assessment, to receive individual and/or group therapy services Contact information: [address] [place] [address] [phone]         [facility]. Go on 12/11/2023.   Specialty: Behavioral Health Why: You are scheduled for an assessment for the Partial Hospitalization Program on Monday, 10/20 at 10:00 am. This assessment appointment will last approximately 1-1.5 hours and will be virtual via MyChart. PHP is group therapy that meets in person Mon-Thur from 9am-2pm and Friday from 9am-1pm and lasts for approximately 2-3 weeks. After your assessment on 10/20, you will discuss your PHP start date. If you need to cancel or reschedule this appointment, please call [phone] Contact information: [address] [place]  [address] [phone]        [facility]. Schedule an appointment as soon as possible for a visit.   Why: CALL TO MAKE APPOINTMENT TO GET A PRIMARY CARE DOCTOR. (OPTION 1) Contact information: [address] [place]  [phone]        [facility]. Schedule an appointment as soon as possible for a visit.   Why: CALL TO MAKE APPOINTMENT TO GET A PRIMARY CARE DOCTOR. (OPTION 2) Contact information: [address] [place]  [phone]        [facility]. Schedule an appointment as soon as possible for a visit.   Why: CALL TO MAKE  APPOINTMENT TO GET A PRIMARY CARE DOCTOR. (OPTION 3) Contact information: [address] [employer] [address] [place]  [address] [phone]                Follow-up recommendations:   Activity: as tolerated   Diet: heart healthy   Other: -Follow-up with your outpatient psychiatric provider -instructions on appointment date, time, and address (location) are provided to you in discharge paperwork.   -Take your psychiatric medications as prescribed at discharge - instructions are provided to you in the discharge paperwork   -Follow-up with outpatient primary care doctor and other specialists -for management of chronic medical disease, including: b12  deficiency   -Testing: Follow-up with outpatient provider for abnormal lab results: b12 deficiency   -Recommend abstinence from alcohol, tobacco, and other illicit drug use at discharge.    -If your psychiatric symptoms recur, worsen, or if you have side effects to your psychiatric medications, call your outpatient psychiatric provider, 911, [phone] or go to the nearest emergency department.   -If suicidal thoughts recur, call your outpatient psychiatric provider, 911, [phone] or go to the nearest emergency department.    Signed: Justino Cornish, MD 12/10/2023, 8:25 AM

## 2023-12-10 NOTE — Progress Notes (Signed)
(  Sleep Hours) - 9 hrs (Any PRNs that were needed, meds refused, or side effects to meds)- hydroxyzine (Any disturbances and when (visitation, over night)- none (Concerns raised by the patient)- none (SI/HI/AVH)- denies

## 2023-12-10 NOTE — Plan of Care (Signed)
   Problem: Education: Goal: Knowledge of [facility] Education information/materials will improve Outcome: Completed/Met Goal: Emotional status will improve Outcome: Completed/Met Goal: Mental status will improve Outcome: Completed/Met Goal: Verbalization of understanding the information provided will improve Outcome: Completed/Met   Problem: Activity: Goal: Interest or engagement in activities will improve Outcome: Completed/Met Goal: Sleeping patterns will improve Outcome: Completed/Met   Problem: Coping: Goal: Ability to verbalize frustrations and anger appropriately will improve Outcome: Completed/Met Goal: Ability to demonstrate self-control will improve Outcome: Completed/Met   Problem: Health Behavior/Discharge Planning: Goal: Identification of resources available to assist in meeting health care needs will improve Outcome: Completed/Met Goal: Compliance with treatment plan for underlying cause of condition will improve Outcome: Completed/Met   Problem: Physical Regulation: Goal: Ability to maintain clinical measurements within normal limits will improve Outcome: Completed/Met   Problem: Safety: Goal: Periods of time without injury will increase Outcome: Completed/Met

## 2023-12-10 NOTE — Plan of Care (Signed)
   Problem: Education: Goal: Knowledge of [place] [facility] Education information/materials will improve Outcome: Progressing Goal: Emotional status will improve Outcome: Progressing Goal: Mental status will improve Outcome: Progressing Goal: Verbalization of understanding the information provided will improve Outcome: Progressing

## 2023-12-11 ENCOUNTER — Other Ambulatory Visit (HOSPITAL_BASED_OUTPATIENT_CLINIC_OR_DEPARTMENT_OTHER): Payer: Self-pay

## 2023-12-11 ENCOUNTER — Other Ambulatory Visit: Payer: Self-pay

## 2023-12-11 ENCOUNTER — Ambulatory Visit (INDEPENDENT_AMBULATORY_CARE_PROVIDER_SITE_OTHER): Admitting: Licensed Clinical Social Worker

## 2023-12-11 DIAGNOSIS — F332 Major depressive disorder, recurrent severe without psychotic features: Secondary | ICD-10-CM | POA: Diagnosis not present

## 2023-12-13 ENCOUNTER — Ambulatory Visit (HOSPITAL_COMMUNITY)

## 2023-12-13 ENCOUNTER — Ambulatory Visit (HOSPITAL_COMMUNITY): Admitting: Licensed Clinical Social Worker

## 2023-12-13 ENCOUNTER — Encounter (HOSPITAL_COMMUNITY): Payer: Self-pay | Admitting: Licensed Clinical Social Worker

## 2023-12-13 DIAGNOSIS — F4310 Post-traumatic stress disorder, unspecified: Secondary | ICD-10-CM

## 2023-12-13 DIAGNOSIS — R4589 Other symptoms and signs involving emotional state: Secondary | ICD-10-CM

## 2023-12-13 DIAGNOSIS — F422 Mixed obsessional thoughts and acts: Secondary | ICD-10-CM

## 2023-12-13 DIAGNOSIS — F3181 Bipolar II disorder: Secondary | ICD-10-CM

## 2023-12-13 DIAGNOSIS — F5105 Insomnia due to other mental disorder: Secondary | ICD-10-CM

## 2023-12-13 DIAGNOSIS — F332 Major depressive disorder, recurrent severe without psychotic features: Secondary | ICD-10-CM

## 2023-12-13 NOTE — Progress Notes (Signed)
 Psychiatric Initial Adult Assessment   Patient Identification: Todd Tucker MRN:  [id number] Date of Evaluation:  12/13/2023 Referral Source:  Recently, Discharge Inpatient  Chief Complaint:  Depression/ Anxiety   Visit Diagnosis:    ICD-10-CM   1. Bipolar II disorder (HCC)  F31.81     2. PTSD (post-traumatic stress disorder)  F43.10       History of Present Illness:  Todd Tucker 43 year old male recently discharged from inpatient admission due to passive suicidal ideations, worsening depression and anxiety symptoms.  He reports during inpatient admission he was initiated on Effexor and he was advised to discontinue Zoloft  and continue Lamictal  200 mg daily.  Reports he has been taking and tolerating medications well.  He no concerns related to suicidal or homicidal ideations.  Denies auditory visual hallucinations.  Reports a good appetite.  States he is resting well throughout the night.  Patient to start partial outpatient hospitalization programming  [facility] 12/13/2023  Hospital course during inpatient admission;Patient was initially hospitalized in the context of loss of job and possible separation from wife in the setting of legal issues. Patient has lots of issues with avoidance and started to except the changes in his life during this hospitalization. Given the extent of his psychosocial changes discussed PHP with patient he was agreeable and will start on 10/20-week. Patient was continued on lamotrigine  but was changed from Zoloft  to Effexor given lack of efficacy on Zoloft . Patient was somewhat shallow during conversations and therapeutic sessions but overall was doing well and denies suicidal thoughts throughout the hospitalization he was very future oriented and accepting of help and euthymic appearing on interactions with him  Per admission assessment note: Todd Tucker is a 43 y.o. male admitted: Presented to the EDfor worsening depression 12/02/2023 10:59 AM  He carries the  psychiatric diagnoses of major depressive disorder, bipolar 2 disorder, posttraumatic stress disorder, mixed obsessive thoughts and generalized anxiety disorder and has a past medical history of dizziness and ankle pain. His current presentation of increased depression is most consistent with diagnoses related to major depressive disorder, generalized anxiety disorder obsessive thoughts. He meets criteria for partial hospitalization outpatient programming based on reported symptoms.  Current outpatient psychotropic medications include Zoloft  200 mg, Xanax  and Lamictal  and historically he has had a fair response to these medications. He was not compliant with medications prior to admission as evidenced by self-reported recently discharged from jail. On initial examination, pleasant calm mood congruent denying suicidal ideations does report a history of self injures behaviors however states  jail saved my life. Please see plan below for detailed recommendation    Associated Signs/Symptoms: Depression Symptoms:  depressed mood, anxiety, (Hypo) Manic Symptoms:  Distractibility, Anxiety Symptoms:  Excessive Worry, Psychotic Symptoms:  Hallucinations: None PTSD Symptoms: NA  Past Psychiatric History: Copied from chart no prior psychiatric admissions, previous diagnoses made by outpatient mental health provider of obsessional thoughts and actions and PTSD. He reports a history of debilitating panic attacks that have been in remission for the past 2 years. He reports a longstanding history of excoriation. Patient reports a history of bipolar affective disorder.  Hx of self harm by punching self. No prior suicide attempts.   Previous Psychotropic Medications: Yes   Substance Abuse History in the last 12 months:  No.  Consequences of Substance Abuse: NA  Past Medical History:  Past Medical History:  Diagnosis Date   Depression    OCD (obsessive compulsive disorder)    Sleep apnea     Past  Surgical History:  Procedure Laterality Date   KNEE SURGERY Bilateral    KNEE SURGERY      Family Psychiatric History: denied   Family History:  Family History  Problem Relation Age of Onset   Anxiety disorder Mother    CVA Father    ADD / ADHD Sister     Social History:   Social History   Socioeconomic History   Marital status: Married    Spouse name: Not on file   Number of children: Not on file   Years of education: Not on file   Highest education level: Not on file  Occupational History   Not on file  Tobacco Use   Smoking status: Never   Smokeless tobacco: Never  Vaping Use   Vaping status: Never Used  Substance and Sexual Activity   Alcohol use: Yes    Comment: rarely   Drug use: No   Sexual activity: Not on file  Other Topics Concern   Not on file  Social History Narrative   Not on file   Social Drivers of Health   Financial Resource Strain: Patient Declined (05/27/2022)   Received from [facility]   Overall Financial Resource Strain (CARDIA)    Difficulty of Paying Living Expenses: Patient declined  Food Insecurity: No Food Insecurity (12/04/2023)   Hunger Vital Sign    Worried About Running Out of Food in the Last Year: Never true    Ran Out of Food in the Last Year: Never true  Transportation Needs: No Transportation Needs (12/04/2023)   PRAPARE - Administrator, Civil Service (Medical): No    Lack of Transportation (Non-Medical): No  Physical Activity: Not on file  Stress: Not on file  Social Connections: Unknown (05/17/2022)   Received from [facility]   Social Network    Social Network: Not on file    Additional Social History:   Allergies:  No Known Allergies  Metabolic Disorder Labs: Lab Results  Component Value Date   HGBA1C 4.5 (L) 12/04/2023   MPG 82.45 12/04/2023   Lab Results  Component Value Date   PROLACTIN 8.9 12/04/2023   Lab Results  Component Value Date   CHOL 178 12/04/2023   TRIG 169 (H)  12/04/2023   HDL 33 (L) 12/04/2023   CHOLHDL 5.4 12/04/2023   VLDL 34 12/04/2023   LDLCALC 111 (H) 12/04/2023   Lab Results  Component Value Date   TSH 1.865 12/04/2023    Therapeutic Level Labs: No results found for: LITHIUM No results found for: CBMZ No results found for: VALPROATE  Current Medications: Current Outpatient Medications  Medication Sig Dispense Refill   cyanocobalamin 1000 MCG tablet Take 1 tablet (1,000 mcg total) by mouth daily. 100 tablet 0   hydrOXYzine (ATARAX) 25 MG tablet Take 1 tablet (25 mg total) by mouth 3 (three) times daily as needed for anxiety. And for sleep as needed. 45 tablet 0   lamoTRIgine  (LAMICTAL ) 200 MG tablet Take 1 tablet (200 mg total) by mouth daily. 30 tablet 0   venlafaxine XR (EFFEXOR-XR) 150 MG 24 hr capsule Take 1 capsule (150 mg total) by mouth daily with breakfast. 30 capsule 0   No current facility-administered medications for this visit.    Musculoskeletal: Strength & Muscle Tone: within normal limits Gait & Station: normal Patient leans: N/A  Psychiatric Specialty Exam: Review of Systems  There were no vitals taken for this visit.There is no height or weight on file to calculate BMI.  General Appearance: Casual  Eye Contact:  Good  Speech:  Clear and Coherent  Volume:  Normal  Mood:  Anxious and Depressed  Affect:  Congruent  Thought Process:  Coherent  Orientation:  Full (Time, Place, and Person)  Thought Content:  Logical  Suicidal Thoughts:  No  Homicidal Thoughts:  No  Memory:  Immediate;   Good Remote;   Good  Judgement:  Good  Insight:  Good  Psychomotor Activity:  Normal  Concentration:  Concentration: Good  Recall:  Good  Fund of Knowledge:Good  Language: Good  Akathisia:  No  Handed:  Right  AIMS (if indicated):  done  Assets:  Communication Skills Desire for Improvement  ADL's:  Intact  Cognition: WNL  Sleep:  Good   Screenings: AUDIT    Flowsheet Row [facility]  Alcohol Use Disorder Identification Test Final Score (AUDIT) 0   Flowsheet Row Ad[facility] (Discharged[facility] Most recent reading at 12/04/2023  6:00 PM ED from 12/04/2023 in [facility] Most recent reading at 12/04/2023 12:52 PM ED from 12/02/2023 in [facility] Most recent reading at 12/02/2023 11:24 AM  C-SSRS RISK CATEGORY Low Risk Error: Q3, 4, or 5 should not be populated when Q2 is No Error: Question 6 not populated    Assessment and Plan:  Patient to start partial hospitalization programming Continue medications as directed  Nicklos Lovecchio is enrolled in Partial Hospitalization Program, patient's current medications are to be continued, the following medications are being continued a comprehensive treatment plan will be developed and side effects of medications have been reviewed with patient  Treatment options and alternatives reviewed with patient and patient understands the above plan. Treatment plan was reviewed and agreed upon by NP T.Jolicia Delira and patient  Demareon Munns need for group services.  Collaboration of Care: Medication Management AEB Effexor and hydroxyzine   Patient/Guardian was advised Release of Information must be obtained prior to any record release in order to collaborate their care with an outside provider. Patient/Guardian was advised if they have not already done so to contact the registration department to sign all necessary forms in order for us  to release information regarding their care.   Consent: Patient/Guardian gives verbal consent for treatment and assignment of benefits for services provided during this visit. Patient/Guardian expressed understanding and agreed to proceed.   [name], NP 10/22/20251:02 PM

## 2023-12-14 ENCOUNTER — Ambulatory Visit (HOSPITAL_COMMUNITY)

## 2023-12-14 ENCOUNTER — Ambulatory Visit (INDEPENDENT_AMBULATORY_CARE_PROVIDER_SITE_OTHER): Admitting: Licensed Clinical Social Worker

## 2023-12-14 DIAGNOSIS — F3181 Bipolar II disorder: Secondary | ICD-10-CM | POA: Diagnosis not present

## 2023-12-14 DIAGNOSIS — R4589 Other symptoms and signs involving emotional state: Secondary | ICD-10-CM

## 2023-12-14 DIAGNOSIS — F4310 Post-traumatic stress disorder, unspecified: Secondary | ICD-10-CM

## 2023-12-15 ENCOUNTER — Ambulatory Visit (INDEPENDENT_AMBULATORY_CARE_PROVIDER_SITE_OTHER): Admitting: Licensed Clinical Social Worker

## 2023-12-15 ENCOUNTER — Ambulatory Visit (HOSPITAL_COMMUNITY)

## 2023-12-15 DIAGNOSIS — F3181 Bipolar II disorder: Secondary | ICD-10-CM | POA: Diagnosis not present

## 2023-12-17 ENCOUNTER — Encounter (HOSPITAL_COMMUNITY): Payer: Self-pay

## 2023-12-17 NOTE — Therapy (Signed)
  [facility] [address] [place], [place], [address] Phone: [phone]   Fax:  [phone]  Occupational Therapy Evaluation  Patient Details  Name: Todd Tucker MRN: [id number] Date of Birth: 06-22-1980 No data recorded  Encounter Date: 12/13/2023   OT End of Session - 12/17/23 2050     Visit Number 1    Number of Visits 15    Date for Recertification  12/28/23    OT Start Time 1230    OT Stop Time 1330    OT Time Calculation (min) 60 min          Past Medical History:  Diagnosis Date   Depression    OCD (obsessive compulsive disorder)    Sleep apnea     Past Surgical History:  Procedure Laterality Date   KNEE SURGERY Bilateral    KNEE SURGERY      There were no vitals filed for this visit.   Subjective Assessment - 12/17/23 2049     Subjective  Hope to learn new skills for coping while I am here.    Pertinent History MDD, BPDII. PTSD    Currently in Pain? No/denies    Pain Score 0-No pain            OT Assessment OCAIRS Mental Health Interview Summary of Client Scores:  Facilitates participation in occupation Allows participation in occupation Inhibits participation in occupation Restricts participation in occupation Comments:  Roles   X    Habits   X    Personal Causation   X    Values  X     Interests   X    Skills  X     Short-Term Goals    X   Long-term Goals    X   Interpretation of Past Experiences   X    Physical Environment   X    Social Environment   X    Readiness for Change  X       Need for Occupational Therapy:  4 Shows positive occupational participation, no need for OT.   3 Need for minimal intervention/consultative participation   2 Need for OT intervention indicated to restore/improve participation   1 Need for extensive OT intervention indicated to improve participation.  Referral for follow up services also recommended.   Assessment:  Patient demonstrates behavior that INHIBITS  participation in occupation.  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    Plan:  Patient will participate in skilled occupational therapy sessions individually or in a group setting to improve coping skills, psychosocial skills, and emotional skills required to return to prior level of function. Treatment will be 4-5 times per week for 3 weeks.     Group Session: O: During today's OT group session, the patient participated in an educational segment about the importance of goal-setting and the application of the SMART framework to enhance daily life, particularly focusing on ADLs and iADLs. The session began with five open-ended pre-session questions that facilitated group discussion and introspection about their current relationship with goals. Following the introduction and educational segment, participants engaged in brainstorming and group discussions to devise hypothetical SMART goals. The session concluded with five post-session questions to reinforce understanding and facilitate reflection. Throughout the session, there was a range of engagement  levels noted among the participants.   A:  Patient demonstrated a high level of engagement throughout the session. They actively participated in discussions, sharing personal experiences related to goal setting and challenges faced. Patient was able to clearly articulate an understanding of the SMART framework and proposed personal SMART goals related to their own ADLs with minimal assistance. They expressed enthusiasm about applying what they learned to their daily routine and appeared motivated to make changes.                     OT Education - 12/17/23 2049     Education Details  OCAIRS / SMART Goals    Person(s) Educated Patient    Comprehension Verbalized understanding           OT Short Term Goals - 12/17/23 2052       OT SHORT TERM GOAL #1   Title Patient will develop and implement a structured daily routine that incorporates at least one productive, one self-care, and one leisure activity to improve consistency, balance, and sense of purpose throughout the week.    Time 4    Period Weeks    Status On-going    Target Date 12/28/23      OT SHORT TERM GOAL #2   Title Patient will identify one personal goal related to daily functioning or wellness and apply the SMART goal framework to create a clear, measurable, and achievable plan to improve engagement in meaningful roles.    Status On-going      OT SHORT TERM GOAL #3   Title Patient will demonstrate improved motivation and initiation by identifying barriers to engagement and using at least one strategy (e.g., breaking tasks into smaller steps, scheduling, or reward systems) to follow through on selected activities.    Status On-going                   Plan - 12/17/23 2051     Clinical Impression Statement Pt presents w/ deficits across multiple psychosocial domains that inhibit participation in daily activities and overall occupational performance    OT Occupational Profile and History Problem Focused Assessment - Including review of records relating to presenting problem    Occupational performance deficits (Please refer to evaluation for details): IADL's;Rest and Sleep;Work;Leisure;Social Participation    Psychosocial Skills Interpersonal Interaction;Habits;Environmental  Adaptations;Routines and Behaviors;Coping Strategies    Rehab Potential Good    Clinical Decision Making Limited treatment options, no task modification necessary    Comorbidities Affecting Occupational Performance: None    Modification or Assistance to Complete Evaluation  No modification of tasks or assist necessary to  complete eval    OT Frequency 5x / week    OT Duration 4 weeks    OT Treatment/Interventions Psychosocial skills training;Coping strategies training    Consulted and Agree with Plan of Care Patient          Patient will benefit from skilled therapeutic intervention in order to improve the following deficits and impairments:       Psychosocial Skills: Interpersonal Interaction, Habits, Environmental  Adaptations, Routines and Behaviors, Coping Strategies   Visit Diagnosis: Difficulty coping  Bipolar II disorder (HCC)  PTSD (post-traumatic stress disorder)  MDD (major depressive disorder), recurrent severe, without psychosis (HCC)  Insomnia due to other mental disorder  Mixed obsessional thoughts and acts    Problem List Patient Active Problem List   Diagnosis Date Noted   MDD (major depressive disorder), recurrent severe, 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  Major depressive disorder, recurrent episode, severe (HCC) 12/05/2023   Generalized anxiety disorder 12/05/2023   Cluster C personality disorder (HCC) 12/05/2023   B12 deficiency 12/05/2023   Excoriation (skin-picking) disorder 12/05/2023   Major depressive disorder, recurrent episode, moderate (HCC) 12/02/2023   Posttraumatic stress disorder 11/04/2017   Bipolar II disorder (HCC) 11/04/2017    [name], OT 12/17/2023, 8:54 PM  Dallas Purpura, OT    [facility] [address] [place], [place], [address] Phone: [phone]   Fax:  [phone]  Name: Abhijay Morriss MRN: [id number] Date of Birth: 1980/07/02

## 2023-12-18 ENCOUNTER — Ambulatory Visit (INDEPENDENT_AMBULATORY_CARE_PROVIDER_SITE_OTHER): Admitting: Licensed Clinical Social Worker

## 2023-12-18 ENCOUNTER — Ambulatory Visit (HOSPITAL_COMMUNITY)

## 2023-12-18 ENCOUNTER — Other Ambulatory Visit (HOSPITAL_BASED_OUTPATIENT_CLINIC_OR_DEPARTMENT_OTHER): Payer: Self-pay

## 2023-12-18 DIAGNOSIS — R4589 Other symptoms and signs involving emotional state: Secondary | ICD-10-CM

## 2023-12-18 DIAGNOSIS — F3181 Bipolar II disorder: Secondary | ICD-10-CM

## 2023-12-18 MED ORDER — DOXEPIN HCL 50 MG PO CAPS
50.0000 mg | ORAL_CAPSULE | Freq: Every day | ORAL | 0 refills | Status: DC
  Filled 2023-12-18: qty 30, 30d supply, fill #0

## 2023-12-18 NOTE — Progress Notes (Unsigned)
 BH MD/PA/NP OP Progress Note  12/20/2023 11:21 AM Todd Tucker  MRN:  [id number]  Chief Complaint: Weekly progress assessment   HPI: Todd Tucker 43 year old male seen and evaluated face-to-face.  Currently attending partial hospitalization programming.  Recently discharged from inpatient admission due to increased depression and anxiety symptoms.  Carries a diagnosis related to major depressive disorder generalized anxiety disorder and bipolar 2 disorder.  He is currently prescribed Effexor 150 mg daily, Lamictal  200 mg daily and hydroxyzine.  He reports concerns related to sleep disturbance.  States he has taken Xanax  and trazodone  in the past without symptom relief.     Sleep disturbance: Initiated doxepin 25 to 50 mg to help with reported symptoms.  He was amendable to plan rating his depression and anxiety 7 out of 10 with 10 being the worst. No concerns related to suicidal or homicidal ideations.  Denies auditory or visual hallucinations. Stated he is enjoying group setting for coping skills, support and structure. There is no indication that he is currently responding to internal/external stimuli or experiencing delusional thought content.    Visit Diagnosis:    ICD-10-CM   1. Bipolar II disorder (HCC)  F31.81       Past Psychiatric History:Copied from chart:  no prior psychiatric admissions, previous diagnoses made by outpatient mental health provider of obsessional thoughts and actions and PTSD. He reports a history of debilitating panic attacks that have been in remission for the past 2 years. He reports a longstanding history of excoriation. Patient reports a history of bipolar affective disorder.  Hx of self harm by punching self. No prior suicide attempts.   Past Medical History:  Past Medical History:  Diagnosis Date   Anxiety    Depression    OCD (obsessive compulsive disorder)    Sleep apnea     Past Surgical History:  Procedure Laterality Date   KNEE SURGERY Bilateral     KNEE SURGERY      Family Psychiatric History:   Family History:  Family History  Problem Relation Age of Onset   Anxiety disorder Mother    CVA Father    ADD / ADHD Sister     Social History:  Social History   Socioeconomic History   Marital status: Legally Separated    Spouse name: Not on file   Number of children: 0   Years of education: Not on file   Highest education level: Master's degree (e.g., MA, MS, MEng, MEd, MSW, MBA)  Occupational History   Not on file  Tobacco Use   Smoking status: Never   Smokeless tobacco: Never  Vaping Use   Vaping status: Never Used  Substance and Sexual Activity   Alcohol use: Yes    Comment: rarely   Drug use: No   Sexual activity: Not on file  Other Topics Concern   Not on file  Social History Narrative   Not on file   Social Drivers of Health   Financial Resource Strain: Patient Declined (05/27/2022)   Received from [facility]   Overall Financial Resource Strain (CARDIA)    Difficulty of Paying Living Expenses: Patient declined  Food Insecurity: No Food Insecurity (12/04/2023)   Hunger Vital Sign    Worried About Running Out of Food in the Last Year: Never true    Ran Out of Food in the Last Year: Never true  Transportation Needs: No Transportation Needs (12/04/2023)   PRAPARE - Transportation    Lack of Transportation (Medical): No    Lack  of Transportation (Non-Medical): No  Physical Activity: Not on file  Stress: Not on file  Social Connections: Unknown (05/17/2022)   Received from [facility]   Social Network    Social Network: Not on file    Allergies: No Known Allergies  Metabolic Disorder Labs: Lab Results  Component Value Date   HGBA1C 4.5 (L) 12/04/2023   MPG 82.45 12/04/2023   Lab Results  Component Value Date   PROLACTIN 8.9 12/04/2023   Lab Results  Component Value Date   CHOL 178 12/04/2023   TRIG 169 (H) 12/04/2023   HDL 33 (L) 12/04/2023   CHOLHDL 5.4 12/04/2023   VLDL 34  12/04/2023   LDLCALC 111 (H) 12/04/2023   Lab Results  Component Value Date   TSH 1.865 12/04/2023    Therapeutic Level Labs: No results found for: LITHIUM No results found for: VALPROATE No results found for: CBMZ  Current Medications: Current Outpatient Medications  Medication Sig Dispense Refill   doxepin (SINEQUAN) 50 MG capsule Take 1 capsule (50 mg total) by mouth at bedtime. 30 capsule 0   cyanocobalamin 1000 MCG tablet Take 1 tablet (1,000 mcg total) by mouth daily. 100 tablet 0   hydrOXYzine (ATARAX) 25 MG tablet Take 1 tablet (25 mg total) by mouth 3 (three) times daily as needed for anxiety. And for sleep as needed. 45 tablet 0   lamoTRIgine  (LAMICTAL ) 200 MG tablet Take 1 tablet (200 mg total) by mouth daily. 30 tablet 0   venlafaxine XR (EFFEXOR-XR) 150 MG 24 hr capsule Take 1 capsule (150 mg total) by mouth daily with breakfast. 30 capsule 0   No current facility-administered medications for this visit.     Musculoskeletal: Strength & Muscle Tone: within normal limits Gait & Station: normal Patient leans: N/A  Psychiatric Specialty Exam: Review of Systems  There were no vitals taken for this visit.There is no height or weight on file to calculate BMI.  General Appearance: Casual  Eye Contact:  Good  Speech:  Clear and Coherent  Volume:  Normal  Mood:  Anxious and Depressed  Affect:  Congruent  Thought Process:  Coherent  Orientation:  Full (Time, Place, and Person)  Thought Content: Logical   Suicidal Thoughts:  No  Homicidal Thoughts:  No  Memory:  Immediate;   Good Recent;   Good  Judgement:  Good  Insight:  Good  Psychomotor Activity:  Normal  Concentration:  Concentration: Good  Recall:  Good  Fund of Knowledge: Good  Language: Good  Akathisia:  No  Handed:  Right  AIMS (if indicated): not done  Assets:  Communication Skills Desire for Improvement  ADL's:  Intact  Cognition: WNL  Sleep:  Poor   Screenings: AUDIT    Flowsheet  Row [facility]  Alcohol Use Disorder Identification Test Final Score (AUDIT) 0   GAD-7    Flowsheet Row Counselor from 12/18/2023 in [facility]  Total GAD-7 Score 21   PHQ2-9    Flowsheet Row Counselor from 12/18/2023 in [facility] Counselor from 12/11/2023 in [facility]  PHQ-2 Total Score 4 6  PHQ-9 Total Score 18 21   Flowsheet Row Counselor from 12/11/2023 in [facility] Most recent reading at 12/11/2023 10:00 AM [facility] ([facility] Most recent reading at 12/04/2023  6:00 PM ED from 12/04/2023 in [facility] Most recent reading at 12/04/2023 12:52 PM  C-SSRS RISK CATEGORY Error: Q3, 4, or 5 should not be populated when Q2 is No Low Risk Error: Q3, 4, or 5 should not be populated when Q2 is No     Assessment and Plan:  Continue partial hospitalization programming (PHP) Initiated doxepin 25 to 50 mg nightly Continue medications as directed  Collaboration of Care: Collaboration of Care: Medication Management AEB initiated doxepin  Patient/Guardian was advised Release of Information must be obtained prior to any record release in order to collaborate their care with an outside provider. Patient/Guardian was advised if they have not already done so to contact the registration department to sign all necessary forms in order for us  to release information regarding their care.   Consent: Patient/Guardian gives verbal consent for treatment and assignment of benefits for services provided during this visit. Patient/Guardian expressed understanding and agreed to proceed.    [name], NP 12/20/2023, 11:21 AM

## 2023-12-18 NOTE — Psych (Signed)
 [facility] BH PHP THERAPIST PROGRESS NOTE  Todd Tucker [id number]   Session Time: 9:00 am - 10:00 am  Participation Level: Active  Behavioral Response: CasualAlertEuthymic  Type of Therapy: Group Therapy  Treatment Goals addressed: Coping  Progress Towards Goals: Progressing  Interventions: CBT, DBT, Solution Focused, Strength-based, Supportive, and Reframing  Therapist Response: Clinician led check-in regarding current stressors and situation, and review of patient completed daily inventory. Clinician utilized active listening and empathetic response and validated patient emotions. Clinician facilitated processing group on pertinent issues.?   Summary: Patient arrived within time allowed. Patient rates their depression at a 7 and anxiety at a 3 on a scale of 1-10 with 10 being best. 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 When asked about sleep and appetite, pt reports they slept 7 hours last night and ate 3 meals yesterday. Pt denied experiencing SI/SH thoughts and endorsed feelings of hopelessness since last session. Pt able to process.?Pt engaged in discussion.?      Session Time: 10:00 am - 11:00 am  Participation Level: Active  Behavioral Response: CasualAlertEuthymic  Type of Therapy: Group Therapy  Treatment Goals addressed: Coping  Progress Towards Goals: Progressing  Interventions: CBT, DBT, Solution Focused, Strength-based, Supportive, and Reframing  Therapist Response: Clinician led processing group for pt's current struggles. Group members shared stressors and provided support and feedback. Clinician brought in topics of boundaries, specifically that a boundary is a door rather than a wall, to inform  discussion.  Summary: Pt able to process and provide support to group.     Session Time: 11:00 am - 12:00 pm  Participation Level: Active  Behavioral Response: CasualAlertEuthymic  Type of Therapy: Group Therapy  Treatment Goals addressed: Coping  Progress Towards Goals: Progressing  Interventions: CBT, DBT, Solution Focused, Strength-based, Supportive, and Reframing  Therapist Response: Clinician led group on social supports. Utilizing a handout on the subject, clinician led discussion surrounding types of supports and asked patients to identify areas of support that are missing for them. The fear of a rejection as a barrier to building one's support system was discussed, and patients viewed a Ted Talk entitled "What I Learned From 100 Days of Rejection" by Jia Jiang. Clinician utilized ACT principles to inform discussion.  Summary: Pt engaged in discussion and demonstrated good insight into the subject matter.   Session Time: 12:00 pm - 12:30 pm  Participation Level: Active  Behavioral Response: CasualAlertEuthymic  Type of Therapy: Group Therapy  Treatment Goals addressed: Coping  Progress Towards Goals: Progressing  Interventions: Psychologist, Occupational, Supportive  Therapist Response: Reflection Group: Patients encouraged to practice skills and interpersonal techniques or work on mindfulness and relaxation techniques. The importance of self-care and making skills part of a routine to increase usage were stressed.  Summary: Patient engaged and participated appropriately.   Session Time: 12:30 pm - 1:30 pm  Participation Level: Active  Behavioral Response: CasualAlertEuthymic  Type of Therapy: Group Therapy  Treatment Goals addressed: Coping  Progress Towards Goals: Progressing  Interventions: CBT, DBT, Solution Focused, Strength-based, Supportive, and Reframing  Therapist Response: Group was led by occupational therapist, Edward Hollan.   Summary: Pt  engaged and participated in discussion.   Session Time: 1:30 pm - 2:00  pm  Participation Level: Active  Behavioral Response: CasualAlertEuthymic  Type of Therapy: Group Therapy  Treatment Goals addressed: Coping  Progress Towards Goals: Progressing  Interventions: CBT, DBT, Solution Focused, Strength-based, Supportive, and Reframing  Therapist Response: 1:30 pm - 1:50 pm: Clinician continued discussion of social supports and rejection. Patients were invited to share thoughts and reactions to [facility] Talk. Clinician utilized ACT principles to inform discussion. 1:50 - 2:00 pm: Clinician led check-out. Clinician assessed for immediate needs, medication compliance and efficacy, and safety concerns?  Summary: 1:30 pm - 1:50 pm: Pt participated in discussion. 1:50 - 2:00 pm: At check-out, patient contracts for safety.?Patient demonstrates progress as evidenced by his continued engagement and by being receptive to treatment. Patient denies SI/HI/self-harm thoughts at the end of group and agrees to seek help should those thoughts/feelings occur.?    Suicidal/Homicidal: Nowithout intent/plan  Plan: ?Pt will continue in PHP and medication management while continuing to work on decreasing depression symptoms,?SI, and anxiety symptoms,?and increasing the ability to self manage symptoms.     Collaboration of Care: Medication Management AEB [name], NP  Patient/Guardian was advised Release of Information must be obtained prior to any record release in order to collaborate their care with an outside provider. Patient/Guardian was advised if they have not already done so to contact the registration department to sign all necessary forms in order for us  to release information regarding their care.   Consent: Patient/Guardian gives verbal consent for treatment and assignment of benefits for services provided during this visit. Patient/Guardian expressed understanding and agreed to proceed.    Diagnosis: Bipolar II disorder (HCC) [F31.81]    1. Bipolar II disorder [facility])       [name], LCSW 12/18/2023

## 2023-12-19 ENCOUNTER — Ambulatory Visit (INDEPENDENT_AMBULATORY_CARE_PROVIDER_SITE_OTHER): Admitting: Professional

## 2023-12-19 ENCOUNTER — Encounter (HOSPITAL_COMMUNITY): Payer: Self-pay

## 2023-12-19 ENCOUNTER — Ambulatory Visit (HOSPITAL_COMMUNITY)

## 2023-12-19 DIAGNOSIS — F3181 Bipolar II disorder: Secondary | ICD-10-CM | POA: Diagnosis not present

## 2023-12-19 DIAGNOSIS — F4310 Post-traumatic stress disorder, unspecified: Secondary | ICD-10-CM

## 2023-12-19 DIAGNOSIS — R4589 Other symptoms and signs involving emotional state: Secondary | ICD-10-CM

## 2023-12-19 NOTE — Psych (Signed)
 [facility] BH PHP THERAPIST PROGRESS NOTE  Todd Tucker [id number]   Session Time: 9:00 am - 10:00 am  Participation Level: Active  Behavioral Response: CasualAlertEuthymic  Type of Therapy: Group Therapy  Treatment Goals addressed: Coping  Progress Towards Goals: Progressing  Interventions: CBT, DBT, Solution Focused, Strength-based, Supportive, and Reframing  Therapist Response: Clinician led check-in regarding current stressors and situation, and review of patient completed daily inventory. Clinician utilized active listening and empathetic response and validated patient emotions. Clinician facilitated processing group on pertinent issues.?   Summary: Patient arrived within time allowed. Patient rates their depression at a 7 and anxiety at a 6 on a scale of 1-10 with 10 being best. When asked about sleep and appetite, pt reports they slept 5 hours last night and ate 5x yesterday. Pt reports he had a good afternoon. He spent time at the gym, and spent time with his Mom and Dad. He reports he is struggling because he wants/needs to make friends in the area to gain support. Pt denied experiencing SI/SH thoughts and endorsed feelings of hopelessness since last session. Pt able to process.?Pt engaged in discussion.?      Session Time: 10:00 am - 11:00 am  Participation Level: Active  Behavioral Response: CasualAlertEuthymic  Type of Therapy: Group Therapy  Treatment Goals addressed: Coping  Progress Towards Goals: Progressing  Interventions: CBT, DBT, Solution Focused, Strength-based, Supportive, and Reframing  Therapist Response: Clinician led processing group for pt's current struggles. Group members shared stressors and provided support and feedback. Clinician brought in topics of resiliency, mindfulness and grounding techniques, and the power of getting to choose what to share with others.  Summary: Pt able to process and provide support to group. Pt reports he struggles with  resiliency and is able to discuss new definitions for resiliency. He reports he wants to work on techniques to be mindful to help with resiliency.     Session Time: 11:00 am - 12:00 pm   Participation Level: Active   Behavioral Response: Casual Alert and Anxious/Depressed   Type of Therapy: Group Therapy   Treatment Goals addressed: Coping   Progress Towards Goals: Progressing   Interventions: CBT, DBT, Solution Focused, Strength-based, Supportive, and Reframing   Therapist Response: Group was led by [facility] chaplain, Amy Delores.   Summary: Pt engaged in discussion.      Session Time: 12:00 pm - 12:30 pm   Participation Level: Active   Behavioral Response: Casual Alert and Anxious/Depressed   Type of Therapy: Group Therapy   Treatment Goals addressed: Coping   Progress Towards Goals: Progressing   Interventions: Psychologist, Occupational, Supportive   Therapist Response: Reflection Group: Patients encouraged to practice skills and interpersonal techniques or work on mindfulness and relaxation techniques. The importance of self-care and making skills part of a routine to increase usage were stressed.   Summary: Pt engaged in discussion.      Session Time: 12:30 pm - 1:30 pm   Participation Level: Active   Behavioral Response: Casual Alert and Anxious/Depressed   Type of Therapy: Group Therapy   Treatment Goals addressed: Coping   Progress Towards Goals: Progressing   Interventions: OT group   Therapist Response: Group was led by occupational therapist, Edward Hollan.    Summary: Pt engaged in discussion.            Session Time: 1:30 pm - 2:00 pm   Participation Level: Active   Behavioral Response: Casual Alert and Anxious/Depressed   Type of Therapy: Group Therapy  Treatment Goals addressed: Coping   Progress Towards Goals: Progressing   Interventions: CBT, DBT, Solution Focused, Strength-based, Supportive, and Reframing   Therapist  Response: 1:30-150pm: Cln led group on discussing and practicing grounding techniques including 5-4-3-2-1, categories game, counting backwards, box breathing, and others. 1:50 - 2:00 pm: Clinician led check-out. Clinician assessed for immediate needs, medication compliance and efficacy, and safety concerns?   Summary: 1:30-1:50pm: Pt engaged in activities and discussion. 1:50 pm - 2:00 pm: At check-out, patient reports no immediate concerns. Patient demonstrates progress as evidenced by engagement and responsiveness to treatment. Patient denies SI/HI/self-harm thoughts at the end of group.   Suicidal/Homicidal: Nowithout intent/plan  Plan: ?Pt will continue in PHP and medication management while continuing to work on decreasing depression symptoms,?SI, and anxiety symptoms,?and increasing the ability to self manage symptoms.     Collaboration of Care: Medication Management AEB [name], NP  Patient/Guardian was advised Release of Information must be obtained prior to any record release in order to collaborate their care with an outside provider. Patient/Guardian was advised if they have not already done so to contact the registration department to sign all necessary forms in order for us  to release information regarding their care.   Consent: Patient/Guardian gives verbal consent for treatment and assignment of benefits for services provided during this visit. Patient/Guardian expressed understanding and agreed to proceed.   Diagnosis: Bipolar II disorder (HCC) [F31.81]    1. Bipolar II disorder (HCC)   2. Posttraumatic stress disorder       [name], [facility] 12/19/2023

## 2023-12-19 NOTE — Progress Notes (Signed)
   12/11/23 1000  Patient-Centered Profile  PCP Completed On 12/11/23  Plan Meeting Date 12/11/23  Effective Date 12/13/23  What People Like and [place] About? 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  What's Important to? Pt states I want to heal so I can do what I do best. Help people.  How Best to Support? Pt states Talk to me. Listen to me.  Add What's Working / [place] Not Working? What's working: Pt states My parents are great and I have a place to live and I'm going to be getting this help from you all. What's not working: Pt states I feel like I don't have a purpose. I don't know what to do.  PCP Action Plan  Long Range Outcome Pt reports I want stability and be able to feel better about myself. Get back to helping people.

## 2023-12-19 NOTE — Progress Notes (Unsigned)
 Spoke with patient in person for PHP. States that he was inpatient for 1 week for suicidal thoughts and depression. He is figuring out his life since he and his wife of 10 years separated 4.5 months ago. She left him after he was having some mental health challenges. He has known her for 31 years and never thought she would leave when he needed her. No children and also states he has been incarcerated for 3 months on some allegations. On scale 1-10 as 10 being worst he rates depression at 5 and anxiety at 3. Denies SI/HI or AVH. No issues or complaints. No side effects from medication.

## 2023-12-19 NOTE — Psych (Signed)
 Virtual Visit via Video Note  I connected with Todd Tucker on 12/11/23 at 10:00 AM EDT by a video enabled telemedicine application and verified that I am speaking with Todd correct person using two identifiers.  Location: Patient: patient home Provider: clinical home office   I discussed Todd limitations of evaluation and management by telemedicine and Todd availability of in person appointments. Todd patient expressed understanding and agreed to proceed.  I discussed Todd assessment and treatment plan with Todd patient. Todd patient was provided an opportunity to ask questions and all were answered. Todd patient agreed with Todd plan and demonstrated an understanding of Todd instructions.   Todd patient was advised to call back or seek an in-person evaluation if Todd symptoms worsen or if Todd condition fails to improve as anticipated.  I provided 75 minutes of non-face-to-face time during this encounter.   Todd Bastos, LCSW    Comprehensive Clinical Assessment (CCA) Note  12/19/2023 Todd Tucker [id number]  Chief Complaint: No chief complaint on file.  Visit Diagnosis: MDD    CCA Screening, Triage and Referral (STR)  Patient Reported Information How did you hear about us ? Tucker Discharge  Referral name: [facility] ([facility]  Referral phone number: No data recorded  Whom do you see for routine medical problems? I don't have a doctor  Practice/Facility Name: No data recorded Practice/Facility Phone Number: No data recorded Name of Contact: No data recorded Contact Number: No data recorded Contact Fax Number: No data recorded Prescriber Name: No data recorded Prescriber Address (if known): No data recorded  What Is Todd Reason for Your Visit/Call Today? Pt presents for PHP intake per [facility] after discharge from psychiatric inpatient  How Long Has This Been Causing You Problems? > than 6 months  What Do You Feel Would Help You Todd Most Today? Treatment for Depression or other mood  problem   Have You Recently Been in Any Inpatient Treatment (Tucker/Detox/Crisis Center/28-Day Program)? Yes  Name/Location of Program/[facility]  How Long Were You There? 6 days  When Were You Discharged? 12/10/23   Have You Ever Received Services From [employer] Before? Yes  Who Do You See at [place] [facility]? crisis services/inpt   Have You Recently Had Any Thoughts About Hurting Yourself? Yes  Are You Planning to Commit Suicide/Harm Yourself At This time? No   Have you Recently Had Thoughts About Hurting Someone Sherral? No  Explanation: Passive SI prior to hospitalization   Have You Used Any Alcohol or Drugs in Todd Past 24 Hours? No  How Long Ago Did You Use Drugs or Alcohol? No data recorded What Did You Use and How Much? No data recorded  Do You Currently Have a Therapist/Psychiatrist? No  Name of Therapist/Psychiatrist: No data recorded  Have You Been Recently Discharged From Any Office Practice or Programs? No  Explanation of Discharge From Practice/Program: No data recorded    CCA Screening Triage Referral Assessment Type of Contact: Face-to-Face  Is this Initial or Reassessment? No data recorded Date Telepsych consult ordered in [facility]:  No data recorded Time Telepsych consult ordered in [facility]:  No data recorded  Patient Reported Information Reviewed? No data recorded Patient Left Without Being Seen? No data recorded Reason for Not Completing Assessment: No data recorded  Collateral Involvement: EHR   Does Patient Have a Court Appointed Legal Guardian? No data recorded Name and Contact of Legal Guardian: No data recorded If Minor and Not Living with Parent(s), Who has Custody? adult  Is CPS involved or ever been involved? Never  Is APS involved or ever been involved? Never   Patient Determined To Be At Risk for Harm To Self or Others Based on Review of Patient Reported Information or Presenting Complaint? No  Method: No Plan  Availability of  Means: Has close by  Intent: Vague intent or NA  Notification Required: No need or identified person  Additional Information for Danger to Others Potential: -- (na)  Additional Comments for Danger to Others Potential: none  Are There Guns or Other Weapons in Your Home? No  Types of Guns/Weapons: pt denied access to weapons, firearms  Are These Weapons Safely Secured?                            -- (na)  Who Could Verify You Are Able To Have These Secured: na  Do You Have any Outstanding Charges, Pending Court Dates, Parole/Probation? pt denied  Contacted To Inform of Risk of Harm To Self or Others: -- (na)   Location of Assessment: Other (comment)   Does Patient Present under Involuntary Commitment? No  IVC Papers Initial File Date: No data recorded  [place] of Residence: [place]   Patient Currently Receiving Todd Following Services: Not Receiving Services   Determination of Need: Routine (7 days)   Options For Referral: Partial Hospitalization     CCA Biopsychosocial Intake/Chief Complaint:  Pt presents as Tucker discharge from Todd Tucker for intake to PHP. Pt reports history of depression throughout most of his life, with a major downslide Todd last 5-6 years and getting really, really bad Todd past year. Pt reports further exacerbation of symptoms occurred when he was arrested for alleged indecent liberties with a minor in Todd summer. Pt denies these allegations. Pt spent 3 months in jail until he made bail early October. Pt went inpatient for MH only a few days after release from jail and spent 6 days inpatient. Pt discharged from inpt yesterday and reports it was helpful. Pt is currently staying with Mom and Dad in [place], instead of [place] where he resided previously. Pt reports intermittent therapy and medication management since middle school. Pt reports history of passive SI and self-harm. Pt states last self harm incident was May 2025, and that he never had  plan/intent with his SI until this year. Pt denies awareness of any triggers that increase his depression/anxiety. pt further states that going to jail probably saved my life becasue he had been seeking additional support for his mental health all year but was hitting roadblocks, and being in jail with no expectations on him gave him time to refocus. Pt is seeking meaning from recent events. Pt reports anger over Todd allegations against him and states that his coworkers, friends, and wife were surprised re: Todd allegations and most supported him. 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 Pt reports she was struggling with mental health and at home. Pt states he found out she was cutting and took her to guidance and after that point, he and guidance worked together to keep student safe. Pt states student slept on his family couch a few times when she didn't feel safe going home. Pt states student's parents knew where she was and his wife was also a support to student. Pt states she's a sick kid. Pt reports charges relate to allegations of pt asking to see her boobies. Pt again denies this occurred.  Pt reports current support from 2 best friends, his parents, and sister. Pt reports his wife has not spoken to him since he went to jail and will not communicate with him, for which he feels betrayed. Pt denies current medical issues, AVH, current SI/HI, substance use, and access to weapons. Pt reports struggles with aimlessness and feeling like a loser.  Current Symptoms/Problems: Pt reports depression, low self esteem/worth, struggles with hopelessness, focus, panic, constant worry, rumination, poor sleep.   Patient Reported Schizophrenia/Schizoaffective Diagnosis in Past: No   Strengths: able to accept help, seeking positive  Preferences: No data recorded Abilities: No data recorded  Type of Services Patient  Feels are Needed: intensive   Initial Clinical Notes/Concerns: No data recorded  Mental Health Symptoms Depression:  Difficulty Concentrating; Hopelessness; Sleep (too much or little); Tearfulness; Worthlessness; Weight gain/loss   Duration of Depressive symptoms: Greater than two weeks   Mania:  None   Anxiety:   Worrying; Tension; Sleep; Difficulty concentrating   Psychosis:  None   Duration of Psychotic symptoms: No data recorded  Trauma:  None   Obsessions:  None   Compulsions:  None   Inattention:  N/A   Hyperactivity/Impulsivity:  N/A   Oppositional/Defiant Behaviors:  N/A   Emotional Irregularity:  Recurrent suicidal behaviors/gestures/threats; Chronic feelings of emptiness   Other Mood/Personality Symptoms:  none    Mental Status Exam Appearance and self-care  Stature:  Average   Weight:  Average weight   Clothing:  Casual   Grooming:  Normal   Cosmetic use:  None   Posture/gait:  Normal   Motor activity:  Not Remarkable   Sensorium  Attention:  Normal   Concentration:  Normal   Orientation:  X5   Recall/memory:  Normal   Affect and Mood  Affect:  Depressed; Tearful; Anxious   Mood:  Depressed   Relating  Eye contact:  Normal   Facial expression:  Depressed   Attitude toward examiner:  Cooperative   Thought and Language  Speech flow: Clear and Coherent   Thought content:  Appropriate to Mood and Circumstances   Preoccupation:  None   Hallucinations:  None   Organization:  No data recorded  [employer] of Knowledge:  Average   Intelligence:  Average   Abstraction:  Functional   Judgement:  Impaired   Reality Testing:  Adequate   Insight:  Fair   Decision Making:  Vacilates   Social Functioning  Social Maturity:  Responsible   Social Judgement:  Normal   Stress  Stressors:  Grief/losses; Family conflict; Housing; Surveyor, Quantity; Work   Coping Ability:  Exhausted; Overwhelmed   Skill Deficits:   Self-care; Interpersonal; Decision making   Supports:  Family; Friends/Service system; Support needed     Religion: Religion/Spirituality Are You A Religious Person?: Yes What is Your Religious Affiliation?: Christian How Might This Affect Treatment?: Pt lists christian spirituality is a support to him  Leisure/Recreation: Leisure / Recreation Do You Have Hobbies?: No  Exercise/Diet: Exercise/Diet Do You Exercise?: Yes What Type of Exercise Do You Do?: Weight Training How Many Times a Week Do You Exercise?: Daily Have You Gained or Lost A Significant Amount of Weight in Todd Past Six Months?: Yes-Lost Number of Pounds Lost?: 80 Do You Follow a Special Diet?: No Do You Have Any Trouble Sleeping?: Yes Explanation of Sleeping Difficulties: Pt has had difficulty sleeping for quite a while getting he estimates 3-4 hours per night.   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?: No  Education: Education Is Patient Currently Attending School?: No Did Garment/textile Technologist From [employer]?: Yes Did Theme Park Manager?: Yes Did You Have An Individualized Education Program (IIEP): No Did You Have Any Difficulty At [employer]?: No Patient's Education Has Been Impacted by Current Illness: No   CCA Family/Childhood History Family and Relationship History: Family history Marital status: Separated Separated, when?: Summer 2025 What types of issues is patient dealing with in Todd relationship?: allegations of inappropriate behavior with a male consulting civil engineer. pt denies Additional relationship information: N/A Are you sexually active?: No What is your sexual  orientation?: Heterosexual Does patient have children?: No  Childhood History:  Childhood History By whom was/is Todd patient raised?: Both parents Description of patient's relationship with caregiver when they were a child: pt reports positive relationship Patient's description of current relationship with people who raised him/her: Pt reports they are primary support for him Does patient have siblings?: Yes Number of Siblings: 1 Description of patient's current relationship with siblings: Pt reports one younger sister who is being cupportive currently Did patient suffer any verbal/emotional/physical/sexual abuse as a child?: No Did patient suffer from severe childhood neglect?: No Has patient ever been sexually abused/assaulted/raped as an adolescent or adult?: No Was Todd patient ever a victim of a crime or a disaster?: No Witnessed domestic violence?: No Has patient been affected by domestic violence as an adult?: No  Child/Adolescent Assessment:     CCA Substance Use Alcohol/Drug Use: Alcohol / Drug Use Pain Medications: see MAR. pt reports accuracy Prescriptions: see MAR. pt reports accuracy Over Todd Counter: see MAR. pt reports accuracy History of alcohol / drug use?: No history of alcohol / drug abuse                         ASAM's:  Six Dimensions of Multidimensional Assessment  Dimension 1:  Acute Intoxication and/or Withdrawal Potential:      Dimension 2:  Biomedical Conditions and Complications:      Dimension 3:  Emotional, Behavioral, or Cognitive Conditions and Complications:     Dimension 4:  Readiness to Change:     Dimension 5:  Relapse, Continued use, or Continued Problem Potential:     Dimension 6:  Recovery/Living Environment:     ASAM Severity Score:    ASAM Recommended Level of Treatment:     Substance use Disorder (SUD)    Recommendations for Services/Supports/Treatments: Recommendations for Services/Supports/Treatments Recommendations  For Services/Supports/Treatments: Partial Hospitalization  DSM5 Diagnoses: Patient Active Problem List   Diagnosis Date Noted   MDD (major depressive disorder), recurrent severe, 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   Major depressive disorder, recurrent episode, severe (HCC) 12/05/2023   Generalized anxiety disorder 12/05/2023   Cluster C personality disorder (HCC) 12/05/2023   B12 deficiency 12/05/2023   Excoriation (skin-picking) disorder 12/05/2023   Major depressive disorder, recurrent episode, moderate (HCC) 12/02/2023   Posttraumatic stress disorder 11/04/2017   Bipolar II disorder (HCC) 11/04/2017    Patient Centered Plan: Patient is on Todd following Treatment Plan(s):  Depression   Referrals to Alternative Service(s): Referred to Alternative Service(s):   Place:   Date:   Time:    Referred to Alternative Service(s):  Place:   Date:   Time:    Referred to Alternative Service(s):   Place:   Date:   Time:    Referred to Alternative Service(s):   Place:   Date:   Time:      Collaboration of Care: Other EHR  Patient/Guardian was advised Release of Information must be obtained prior to any record release in order to collaborate their care with an outside provider. Patient/Guardian was advised if they have not already done so to contact Todd registration department to sign all necessary forms in order for us  to release information regarding their care.   Consent: Patient/Guardian gives verbal consent for treatment and assignment of benefits for services provided during this visit. Patient/Guardian expressed understanding and agreed to proceed.   Todd Bastos, LCSW

## 2023-12-20 ENCOUNTER — Encounter (HOSPITAL_COMMUNITY): Payer: Self-pay | Admitting: Licensed Clinical Social Worker

## 2023-12-20 ENCOUNTER — Encounter (HOSPITAL_COMMUNITY): Payer: Self-pay

## 2023-12-20 ENCOUNTER — Ambulatory Visit (HOSPITAL_COMMUNITY)

## 2023-12-20 ENCOUNTER — Other Ambulatory Visit (HOSPITAL_BASED_OUTPATIENT_CLINIC_OR_DEPARTMENT_OTHER): Payer: Self-pay

## 2023-12-20 ENCOUNTER — Ambulatory Visit (INDEPENDENT_AMBULATORY_CARE_PROVIDER_SITE_OTHER): Admitting: Licensed Clinical Social Worker

## 2023-12-20 DIAGNOSIS — F3181 Bipolar II disorder: Secondary | ICD-10-CM

## 2023-12-20 DIAGNOSIS — R4589 Other symptoms and signs involving emotional state: Secondary | ICD-10-CM

## 2023-12-20 NOTE — Therapy (Signed)
 [facility] [address] [place], [place], [address] Phone: [phone]   Fax:  [phone]  Occupational Therapy Treatment  Patient Details  Name: Todd Tucker MRN: [id number] Date of Birth: [birth year] No data recorded  Encounter Date: 12/14/2023   OT End of Session - 12/20/23 2046     Visit Number 2    Number of Visits 15    Date for Recertification  12/28/23    OT Start Time 1230    OT Stop Time 1330    OT Time Calculation (min) 60 min          Past Medical History:  Diagnosis Date   Anxiety    Depression    OCD (obsessive compulsive disorder)    Sleep apnea     Past Surgical History:  Procedure Laterality Date   KNEE SURGERY Bilateral    KNEE SURGERY      There were no vitals filed for this visit.   Subjective Assessment - 12/20/23 2045     Currently in Pain? No/denies    Pain Score 0-No pain              Group Session:  S: Not much sleep last night but I'm doing better.   O: Group members participated in a structured occupational therapy group focused on exploring the relationship between energy levels and meaningful engagement in daily activities. The session introduced a visual rating system allowing participants to identify how much energy an activity typically requires and how meaningful or rewarding it feels. Through guided discussion and individualized reflection, patients examined patterns of imbalance between "high-energy/low-meaning" and "low-energy/high-meaning" tasks to promote awareness of how energy expenditure aligns with personal values, motivation, and daily functioning.   A:  The patient was actively engaged throughout the session, demonstrating insight into personal activity patterns and contributing thoughtful examples of tasks that affect their energy and sense of purpose. They appeared receptive to exploring strategies for balancing meaningful and energizing activities, showing progress toward  improved occupational awareness and self-management of routines.    P: Continue to attend PHP OT group sessions 5x week for 4 weeks to promote daily structure, social engagement, and opportunities to develop and utilize adaptive strategies to maximize functional performance in preparation for safe transition and integration back into school, work, and the community. Plan to address topic of tbd in next OT group session.                  OT Education - 12/20/23 2045     Education Details Energy and Meaningful Activities           OT Short Term Goals - 12/17/23 2052       OT SHORT TERM GOAL #1   Title Patient will develop and implement a structured daily routine that incorporates at least one productive, one self-care, and one leisure activity to improve consistency, balance, and sense of purpose throughout the week.    Time 4    Period Weeks    Status On-going    Target Date 12/28/23      OT SHORT TERM GOAL #2   Title Patient will identify one personal goal related to daily functioning or wellness and apply the SMART goal framework to create a clear, measurable, and achievable plan to improve engagement in meaningful roles.    Status On-going      OT SHORT TERM GOAL #3   Title Patient will demonstrate improved motivation and initiation by identifying barriers to  engagement and using at least one strategy (e.g., breaking tasks into smaller steps, scheduling, or reward systems) to follow through on selected activities.    Status On-going                   Plan - 12/20/23 2046     Psychosocial Skills Interpersonal Interaction;Habits;Environmental  Adaptations;Routines and Behaviors;Coping Strategies          Patient will benefit from skilled therapeutic intervention in order to improve the following deficits and impairments:       Psychosocial Skills: Interpersonal Interaction, Habits, Environmental  Adaptations, Routines and Behaviors, Coping  Strategies   Visit Diagnosis: Difficulty coping    Problem List Patient Active Problem List   Diagnosis Date Noted   MDD (major depressive disorder), recurrent severe, 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   Major depressive disorder, recurrent episode, severe (HCC) 12/05/2023   Generalized anxiety disorder 12/05/2023   Cluster C personality disorder (HCC) 12/05/2023   B12 deficiency 12/05/2023   Excoriation (skin-picking) disorder 12/05/2023   Major depressive disorder, recurrent episode, moderate (HCC) 12/02/2023   Posttraumatic stress disorder 11/04/2017   Bipolar II disorder (HCC) 11/04/2017    [name], OT 12/20/2023, 8:47 PM  D[place] Purpura, OT   [facility] [address] [place], [place], [address] Phone: [phone]   Fax:  [phone]  Name: Joy Haegele MRN: [id number] Date of Birth: Dec 02, 1980

## 2023-12-21 ENCOUNTER — Ambulatory Visit (INDEPENDENT_AMBULATORY_CARE_PROVIDER_SITE_OTHER): Admitting: Licensed Clinical Social Worker

## 2023-12-21 ENCOUNTER — Encounter (HOSPITAL_COMMUNITY): Payer: Self-pay

## 2023-12-21 ENCOUNTER — Ambulatory Visit (HOSPITAL_COMMUNITY)

## 2023-12-21 DIAGNOSIS — F3181 Bipolar II disorder: Secondary | ICD-10-CM

## 2023-12-21 DIAGNOSIS — R4589 Other symptoms and signs involving emotional state: Secondary | ICD-10-CM

## 2023-12-21 NOTE — Therapy (Signed)
 [facility] [address] [place], [place], [address] Phone: [phone]   Fax:  [phone]  Occupational Therapy Treatment  Patient Details  Name: Todd Tucker MRN: [id number] Date of Birth: 1980-12-22 No data recorded  Encounter Date: 12/18/2023   OT End of Session - 12/21/23 2113     Visit Number 3    Number of Visits 15    Date for Recertification  12/28/23    OT Start Time 1230    OT Stop Time 1330    OT Time Calculation (min) 60 min          Past Medical History:  Diagnosis Date   Anxiety    Depression    OCD (obsessive compulsive disorder)    Sleep apnea     Past Surgical History:  Procedure Laterality Date   KNEE SURGERY Bilateral    KNEE SURGERY      There were no vitals filed for this visit.   Subjective Assessment - 12/21/23 2112     Currently in Pain? No/denies    Pain Score 0-No pain           Group Session:  S: Doing a bit better today. Still struggling w/ sleep but that's normal for me.  O: Group members participated in a structured occupational therapy group focused on exploring the relationship between energy levels and meaningful engagement in daily activities. The session introduced a visual rating system allowing participants to identify how much energy an activity typically requires and how meaningful or rewarding it feels. Through guided discussion and individualized reflection, patients examined patterns of imbalance between "high-energy/low-meaning" and "low-energy/high-meaning" tasks to promote awareness of how energy expenditure aligns with personal values, motivation, and daily functioning.   A:  The patient was actively engaged throughout the session, demonstrating insight into personal activity patterns and contributing thoughtful examples of tasks that affect their energy and sense of purpose. They appeared receptive to exploring strategies for balancing meaningful and energizing activities,  showing progress toward improved occupational awareness and self-management of routines.   P: Continue to attend PHP OT group sessions 5x week for 4 weeks to promote daily structure, social engagement, and opportunities to develop and utilize adaptive strategies to maximize functional performance in preparation for safe transition and integration back into school, work, and the community. Plan to address topic of Sleep in next OT group session.                     OT Education - 12/21/23 2113     Education Details Energy and Meaningful Activities           OT Short Term Goals - 12/17/23 2052       OT SHORT TERM GOAL #1   Title Patient will develop and implement a structured daily routine that incorporates at least one productive, one self-care, and one leisure activity to improve consistency, balance, and sense of purpose throughout the week.    Time 4    Period Weeks    Status On-going    Target Date 12/28/23      OT SHORT TERM GOAL #2   Title Patient will identify one personal goal related to daily functioning or wellness and apply the SMART goal framework to create a clear, measurable, and achievable plan to improve engagement in meaningful roles.    Status On-going      OT SHORT TERM GOAL #3   Title Patient will demonstrate improved motivation and initiation by  identifying barriers to engagement and using at least one strategy (e.g., breaking tasks into smaller steps, scheduling, or reward systems) to follow through on selected activities.    Status On-going                   Plan - 12/21/23 2114     Psychosocial Skills Interpersonal Interaction;Habits;Environmental  Adaptations;Routines and Behaviors;Coping Strategies          Patient will benefit from skilled therapeutic intervention in order to improve the following deficits and impairments:       Psychosocial Skills: Interpersonal Interaction, Habits, Environmental  Adaptations, Routines  and Behaviors, Coping Strategies   Visit Diagnosis: Difficulty coping    Problem List Patient Active Problem List   Diagnosis Date Noted   MDD (major depressive disorder), recurrent severe, 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   Major depressive disorder, recurrent episode, severe (HCC) 12/05/2023   Generalized anxiety disorder 12/05/2023   Cluster C personality disorder (HCC) 12/05/2023   B12 deficiency 12/05/2023   Excoriation (skin-picking) disorder 12/05/2023   Major depressive disorder, recurrent episode, moderate (HCC) 12/02/2023   Posttraumatic stress disorder 11/04/2017   Bipolar II disorder (HCC) 11/04/2017    [name], OT 12/21/2023, 9:15 PM  Dallas Purpura, OT   [facility] [address] [place], [place], [address] Phone: [phone]   Fax:  [phone]  Name: Kiran Carline MRN: [id number] Date of Birth: Mar 30, 1980

## 2023-12-22 ENCOUNTER — Ambulatory Visit (HOSPITAL_COMMUNITY)

## 2023-12-22 NOTE — Psych (Signed)
 [facility] BH PHP THERAPIST PROGRESS NOTE  Todd Tucker [id number]  Session Time: 9:00 - 10:00  Participation Level: Active  Behavioral Response: CasualAlertAnxious and Euthymic  Type of Therapy: Group Therapy  Treatment Goals addressed: Coping  Progress Towards Goals: Initial  Interventions: CBT, DBT, Supportive, and Reframing  Summary: Todd Tucker is a 43 y.o. adult who presents with depression and anxiety symptoms.  Clinician led check-in regarding current stressors and situation, and review of patient completed daily inventory. Clinician utilized active listening and empathetic response and validated patient emotions. Clinician facilitated processing group on pertinent issues.?   Summary: Patient arrived within time allowed. Patient rates their depression at a 7 and anxiety at a 5 on a scale of 1-10 with 10 being high. Pt reports they slept 5 hours last night and ate 3x yesterday. Pt reports they are newly home from inpatient and is acclimating to his new reality. Pt states feeling good right now and that he typically starts off well in the morning and devolves as the day progresses.  Pt states he feels trapped in my brain with no way out and is unsure of where his place is currently. Pt reports difficulty filling his time and struggles with rumination.  Pt presents as positive and seeks to turn negatives into a silver lining. When questioned on this, pt states he isn't sure where he'll go if he doesn't try to stay positive. Pt denied experiencing SI/SH thoughts. Pt able to process.?Pt engaged in discussion.?       Session Time: 10:00 am - 11:00 am  Participation Level: Active  Behavioral Response: CasualAlertAnxious and Depressed  Type of Therapy: Group Therapy  Treatment Goals addressed: Coping  Progress Towards Goals: Initial  Interventions: CBT, DBT, Solution Focused, Strength-based, Supportive, and Reframing  Therapist Response: Cln led discussion on time as a piece in  recovery. Cln held space for group members' frustrations that they are not better yet, that they are putting in the work but results are not quick, that they feel they will never get better. Cln provided encouragment to group that they are not doing anything wrong or un-helpable, but that it takes time for treatment to work, for habits to build, and for medicine to reach efficacy. Cln utilized air cabin crew of a broken bone and that even following doctor's orders, time has to pass for the bone to heal.    Summary: Pt engaged in discussion and reports understanding.           Session Time: 11:00 am - 12:00 pm   Participation Level: Active   Behavioral Response: CasualAlertAnxious   Type of Therapy: Group Therapy   Treatment Goals addressed: Coping   Progress Towards Goals: Progressing   Interventions: CBT, DBT, Solution Focused, Strength-based, Supportive, and Reframing   Therapist Response: Cln led discussion on why and what now in terms of how we focus on our problems. Group members shared how focus on why has impacted them and barriers to focusing on what now. Group able to process. Cln encouraged pt's to consider what why accomplishes.    Summary:  Pt engaged in discussion and reports they struggle with focusing on why.             Session Time: 12:00 pm - 12:30 pm   Participation Level: Active   Behavioral Response: CasualAlertAnxious   Type of Therapy: Group Therapy   Treatment Goals addressed: Coping   Progress Towards Goals: Progressing   Interventions: Psychologist, Occupational, Supportive   Therapist Response: Reflection  Group: Patients encouraged to practice skills and interpersonal techniques or work on mindfulness and relaxation techniques. The importance of self-care and making skills part of a routine to increase usage were stressed.   Summary: Patient engaged and participated appropriately.           Session Time: 12:30 pm - 1:30 pm    Participation Level: Active   Behavioral Response: CasualAlertAnxious   Type of Therapy: Group Therapy   Treatment Goals addressed: Coping   Progress Towards Goals: Progressing   Interventions: OT group   Therapist Response: Group was led by occupational therapist, Edward Hollan.    Summary: Pt engaged and participated in discussion.           Session Time: 1:30 pm - 2:00 pm   Participation Level: Active   Behavioral Response: CasualAlertAnxious   Type of Therapy: Group Therapy   Treatment Goals addressed: Coping   Progress Towards Goals: Progressing   Interventions: CBT, DBT, Solution Focused, Strength-based, Supportive, and Reframing   Therapist Response: 1:30 pm - 1:50 pm: Cln introduced the Sensations distraction skills from DBT distress tolerance skill ACCEPTS. Cln discussed how this set of distraction skills can be helpful in situations where you feel overwhelmed and need to stabilize quickly.  1:50 - 2:00 pm: Clinician led check-out. Clinician assessed for immediate needs, medication compliance and efficacy, and safety concerns?   Summary: 1:30 pm - 1:50 pm:  Pt engaged in discussion and identifies how they can practice this skill.   1:50 - 2:00 At check-out, patient contracts for safety.?Patient demonstrates progress as evidenced by continued engagement and by being receptive to treatment. Patient denies SI/HI/self-harm thoughts at the end of group and agrees to seek help should those thoughts/feelings occur.?     Suicidal/Homicidal: Nowithout intent/plan  Plan: Pt will continue in PHP while working to decrease depression and anxiety symptoms, increase daily functioning, and increase ability to manage symptoms in a healthy manner.   Collaboration of Care: Medication Management AEB T Lewis, NP  Patient/Guardian was advised Release of Information must be obtained prior to any record release in order to collaborate their care with an outside provider.  Patient/Guardian was advised if they have not already done so to contact the registration department to sign all necessary forms in order for us  to release information regarding their care.   Consent: Patient/Guardian gives verbal consent for treatment and assignment of benefits for services provided during this visit. Patient/Guardian expressed understanding and agreed to proceed.   Diagnosis: Bipolar II disorder (HCC) [F31.81]    1. Bipolar II disorder (HCC)   2. PTSD (post-traumatic stress disorder)       Randall Bastos, LCSW

## 2023-12-22 NOTE — Psych (Signed)
 [facility] PHP THERAPIST PROGRESS NOTE  Gen Moret [id number]  Session Time: 9:00 - 10:00  Participation Level: Active  Behavioral Response: CasualAlertAnxious and Euthymic  Type of Therapy: Group Therapy  Treatment Goals addressed: Coping  Progress Towards Goals: Progressing  Interventions: CBT, DBT, Supportive, and Reframing  Summary: Breydon Stmarie is a 43 y.o. adult who presents with depression and anxiety symptoms.  Clinician led check-in regarding current stressors and situation, and review of patient completed daily inventory. Clinician utilized active listening and empathetic response and validated patient emotions. Clinician facilitated processing group on pertinent issues.?   Summary: Patient arrived within time allowed. Patient rates their depression at a 5 and anxiety at a 3 on a scale of 1-10 with 10 being high. Pt reports they slept 7 hours last night and ate 5x yesterday. Pt reports he feels better than yesterday. Pt reports continued routine of going to the gym and talking to a friend on the phone. Pt reports continued struggle with rumination when he is unoccupied. Pt reports he began his new sleep medication yesterday and saw improvement.  Pt denied experiencing SI/SH thoughts. Pt able to process.?Pt engaged in discussion.?       Session Time: 10:00 am - 11:00 am  Participation Level: Active  Behavioral Response: CasualAlertAnxious and Depressed  Type of Therapy: Group Therapy  Treatment Goals addressed: Coping  Progress Towards Goals: Initial  Interventions: CBT, DBT, Solution Focused, Strength-based, Supportive, and Reframing  Therapist Response: Cln led discussion on healthy aggression substitutes. Cln discussed the benefits to discharging energy and adrenaline when feeling revved up in emotion and the importance of balancing that discharge with safety and lack of negative consequences. Group brainstormed different ways to channel aggression in a healthy  way and shared ways that have worked for them in the past.   Summary: Pt engaged in discussion and identifies 3 options to try.          Session Time: 11:00 am - 12:00 pm   Participation Level: Active   Behavioral Response: CasualAlertAnxious   Type of Therapy: Group Therapy   Treatment Goals addressed: Coping   Progress Towards Goals: Progressing   Interventions: CBT, DBT, Solution Focused, Strength-based, Supportive, and Reframing   Therapist Response: Cln introduced CBT and the way in which it can provide context for addressing stumbling blocks. Group discussed the problem is not the problem, the problem is how we're thinking about the problem and tried to change perspective on current struggles.    Summary:  Pt engaged in discussion and is able to attempt reframing using CBT.              Session Time: 12:00 pm - 12:30 pm   Participation Level: Active   Behavioral Response: CasualAlertAnxious   Type of Therapy: Group Therapy   Treatment Goals addressed: Coping   Progress Towards Goals: Progressing   Interventions: Psychologist, Occupational, Supportive   Therapist Response: Reflection Group: Patients encouraged to practice skills and interpersonal techniques or work on mindfulness and relaxation techniques. The importance of self-care and making skills part of a routine to increase usage were stressed.   Summary: Patient engaged and participated appropriately.           Session Time: 12:30 pm - 1:30 pm   Participation Level: Active   Behavioral Response: CasualAlertAnxious   Type of Therapy: Group Therapy   Treatment Goals addressed: Coping   Progress Towards Goals: Progressing   Interventions: OT group   Therapist Response: Group was led  by occupational therapist, Edward Hollan.    Summary: Pt engaged and participated in discussion.           Session Time: 1:30 pm - 1:45 pm   Participation Level: Active   Behavioral Response:  CasualAlertAnxious   Type of Therapy: Group Therapy   Treatment Goals addressed: Coping   Progress Towards Goals: Progressing   Interventions: CBT, DBT, Solution Focused, Strength-based, Supportive, and Reframing   Therapist Response: 1:30 pm - 1:45 pm: Clinician led check-out. Clinician assessed for immediate needs, medication compliance and efficacy, and safety concerns?   Summary: 1:30 pm - 1:45 pm: At check-out, patient reports no immediate concerns. Patient demonstrates progress as evidenced by engagement and responsiveness to treatment. Patient denies SI/HI/self-harm thoughts at the end of group.    Suicidal/Homicidal: Nowithout intent/plan  Plan: Pt will continue in PHP while working to decrease depression and anxiety symptoms, increase daily functioning, and increase ability to manage symptoms in a healthy manner.   Collaboration of Care: Medication Management AEB T Lewis, NP  Patient/Guardian was advised Release of Information must be obtained prior to any record release in order to collaborate their care with an outside provider. Patient/Guardian was advised if they have not already done so to contact the registration department to sign all necessary forms in order for us  to release information regarding their care.   Consent: Patient/Guardian gives verbal consent for treatment and assignment of benefits for services provided during this visit. Patient/Guardian expressed understanding and agreed to proceed.   Diagnosis: Bipolar II disorder (HCC) [F31.81]    1. Bipolar II disorder (HCC)       Randall Bastos, LCSW

## 2023-12-22 NOTE — Psych (Signed)
 [facility] BH PHP THERAPIST PROGRESS NOTE  Todd Tucker [id number]  Session Time: 9:00 - 10:00  Participation Level: Active  Behavioral Response: CasualAlertAnxious and Euthymic  Type of Therapy: Group Therapy  Treatment Goals addressed: Coping  Progress Towards Goals: Initial  Interventions: CBT, DBT, Supportive, and Reframing  Summary: Todd Tucker is a 43 y.o. adult who presents with depression and anxiety symptoms.  Clinician led check-in regarding current stressors and situation, and review of patient completed daily inventory. Clinician utilized active listening and empathetic response and validated patient emotions. Clinician facilitated processing group on pertinent issues.?   Summary: Patient arrived within time allowed. Patient rates their depression at a 6 and anxiety at a 7 on a scale of 1-10 with 10 being high. Pt reports they slept 3 hours last night and ate 3x yesterday. Pt reports group went well for him yesterday and he went to the gym in the afternoon. Pt states when he left his place in [place], he wasn't there to pack and his family did it for him. Pt states as he is going through things now he is struggling with what was left behind/thrown away because it was done without his say so. Pt tries to balance gratitude for them completing the task with frustration at what he has lost. Pt is struggling to feel his feelings while making staying positive a priority. Pt denied experiencing SI/SH thoughts. Pt able to process.?Pt engaged in discussion.?       Session Time: 10:00 am - 11:00 am  Participation Level: Active  Behavioral Response: CasualAlertAnxious and Depressed  Type of Therapy: Group Therapy  Treatment Goals addressed: Coping  Progress Towards Goals: Initial  Interventions: CBT, DBT, Solution Focused, Strength-based, Supportive, and Reframing  Therapist Response: Cln led discussion on increasing support. Group members shared what supports they have and where  it is lacking. Cln encouraged ways to increase adult friendships including accepting invitations, joining clubs/activities, reaching out, and support groups.    Summary: Pt engaged in discussion and is able to determine ways to increase their support.            Session Time: 11:00 am - 12:00 pm   Participation Level: Active   Behavioral Response: CasualAlertAnxious   Type of Therapy: Group Therapy   Treatment Goals addressed: Coping   Progress Towards Goals: Progressing   Interventions: CBT, DBT, Solution Focused, Strength-based, Supportive, and Reframing   Therapist Response: Cln led discussion on HALT (hungry, angry, lonely, tired) acronym, which encourages check-in with ourselves when feeling emotional. Cln encouraged pt's to use HALT as a first check-in step to address basic vulnerabilities before reacting. Group members discussed ways in which they react to being angry, lonely, and tired, and work to determine times in which utilizing HALT would have diverted a bigger issue.    Summary:   Pt engaged in discussion and reports understanding of HALT and willingness to utilize it.             Session Time: 12:00 pm - 12:30 pm   Participation Level: Active   Behavioral Response: CasualAlertAnxious   Type of Therapy: Group Therapy   Treatment Goals addressed: Coping   Progress Towards Goals: Progressing   Interventions: Psychologist, Occupational, Supportive   Therapist Response: Reflection Group: Patients encouraged to practice skills and interpersonal techniques or work on mindfulness and relaxation techniques. The importance of self-care and making skills part of a routine to increase usage were stressed.   Summary: Patient engaged and participated appropriately.  Session Time: 12:30 pm - 1:30 pm   Participation Level: Active   Behavioral Response: CasualAlertAnxious   Type of Therapy: Group Therapy   Treatment Goals addressed: Coping   Progress  Towards Goals: Progressing   Interventions: OT group   Therapist Response: Group was led by occupational therapist, Edward Hollan.    Summary: Pt engaged and participated in discussion.           Session Time: 1:30 pm - 2:00 pm   Participation Level: Active   Behavioral Response: CasualAlertAnxious   Type of Therapy: Group Therapy   Treatment Goals addressed: Coping   Progress Towards Goals: Progressing   Interventions: CBT, DBT, Solution Focused, Strength-based, Supportive, and Reframing   Therapist Response: 1:30 pm - 1:50 pm: Cln introduced DBT emotion regulation skill, PLEASE. Cln discussed the importance of taking care of our whole body as a way to aid in stabilizing emotions. Group discussed ways they struggle to manage the elements of PLEASE and how to remove barriers.  1:50 - 2:00 pm: Clinician led check-out. Clinician assessed for immediate needs, medication compliance and efficacy, and safety concerns?   Summary: 1:30 pm - 1:50 pm:  Pt engaged in discussion and is able to identify ways to utilize PLEASE skill.  1:50 - 2:00 At check-out, patient contracts for safety.?Patient demonstrates progress as evidenced by continued engagement and by being receptive to treatment. Patient denies SI/HI/self-harm thoughts at the end of group and agrees to seek help should those thoughts/feelings occur.?     Suicidal/Homicidal: Nowithout intent/plan  Plan: Pt will continue in PHP while working to decrease depression and anxiety symptoms, increase daily functioning, and increase ability to manage symptoms in a healthy manner.   Collaboration of Care: Medication Management AEB T Lewis, NP  Patient/Guardian was advised Release of Information must be obtained prior to any record release in order to collaborate their care with an outside provider. Patient/Guardian was advised if they have not already done so to contact the registration department to sign all necessary forms in order for  us  to release information regarding their care.   Consent: Patient/Guardian gives verbal consent for treatment and assignment of benefits for services provided during this visit. Patient/Guardian expressed understanding and agreed to proceed.   Diagnosis: Bipolar II disorder (HCC) [F31.81]    1. Bipolar II disorder (HCC)   2. PTSD (post-traumatic stress disorder)       Randall Bastos, LCSW

## 2023-12-22 NOTE — Psych (Signed)
 [facility] BH PHP THERAPIST PROGRESS NOTE  Jeno Eisenberger [id number]  Session Time: 9:00 - 10:00  Participation Level: Active  Behavioral Response: CasualAlertAnxious and Euthymic  Type of Therapy: Group Therapy  Treatment Goals addressed: Coping  Progress Towards Goals: Progressing  Interventions: CBT, DBT, Supportive, and Reframing  Summary: Ardell Pankratz is a 43 y.o. adult who presents with depression and anxiety symptoms.  Clinician led check-in regarding current stressors and situation, and review of patient completed daily inventory. Clinician utilized active listening and empathetic response and validated patient emotions. Clinician facilitated processing group on pertinent issues.?   Summary: Patient arrived within time allowed. Patient rates their depression at a 8 and anxiety at a 7 on a scale of 1-10 with 10 being high. Pt reports they slept 2 hours last night and ate 5x yesterday. Pt reports he feels poopy today and elaborates on that as sad, pissed, and frustrated with myself. Pt reports continued struggle with sense of identity and future since he is not currently working or know where he will land. Pt states he did not talk to his friend like normal yesterday and does not think that contributed to his mood. Pt denied experiencing SI/SH thoughts. Pt able to process.?Pt engaged in discussion.?       Session Time: 10:00 am - 11:00 am  Participation Level: Active  Behavioral Response: CasualAlertAnxious and Depressed  Type of Therapy: Group Therapy  Treatment Goals addressed: Coping  Progress Towards Goals: Initial  Interventions: CBT, DBT, Solution Focused, Strength-based, Supportive, and Reframing  Therapist Response: Cln led discussion on DBT dialectics and balance. Cln encouraged pt's to utilize AND statements to cue their brain to hold two opposing ideas. Cln provided examples and group members created their own AND statements.   Summary: Pt engaged in discussion and  created AND statement around recognzing progress.            Session Time: 11:00 am - 12:00 pm   Participation Level: Active   Behavioral Response: CasualAlertAnxious   Type of Therapy: Group Therapy   Treatment Goals addressed: Coping   Progress Towards Goals: Progressing   Interventions: CBT, DBT, Solution Focused, Strength-based, Supportive, and Reframing   Therapist Response: Cln led discussion on negative self-talk and how it affects us . Cln utilized CBT to discuss how thoughts shape our feelings and actions. Group members shared how negative thinking affects them and worked to reframe their negative thinking.     Summary:  Pt engaged in discussion and reports understanding.              Session Time: 12:00 pm - 12:30 pm   Participation Level: Active   Behavioral Response: CasualAlertAnxious   Type of Therapy: Group Therapy   Treatment Goals addressed: Coping   Progress Towards Goals: Progressing   Interventions: Psychologist, Occupational, Supportive   Therapist Response: Reflection Group: Patients encouraged to practice skills and interpersonal techniques or work on mindfulness and relaxation techniques. The importance of self-care and making skills part of a routine to increase usage were stressed.   Summary: Patient engaged and participated appropriately.           Session Time: 12:30 pm - 1:30 pm   Participation Level: Active   Behavioral Response: CasualAlertAnxious   Type of Therapy: Group Therapy   Treatment Goals addressed: Coping   Progress Towards Goals: Progressing   Interventions: OT group   Therapist Response: Group was led by occupational therapist, Edward Hollan.    Summary: Pt engaged and participated in  discussion.           Session Time: 1:30 pm - 2:00 pm   Participation Level: Active   Behavioral Response: CasualAlertAnxious   Type of Therapy: Group Therapy   Treatment Goals addressed: Coping   Progress Towards  Goals: Progressing   Interventions: CBT, DBT, Solution Focused, Strength-based, Supportive, and Reframing   Therapist Response: 1:30 pm - 1:50 pm: Cln led group through guided streatching with deep breathing as a way to increase relaxation and decrease stress and tension being held in the body. Cln discussed the importance of harnessing the body-mind connection informed by DBT TIPP skills.  1:50 - 2:00 pm: Clinician led check-out. Clinician assessed for immediate needs, medication compliance and efficacy, and safety concerns?   Summary: 1:30 pm - 1:50 pm:  Pt engaged in discussion and practice.  1:50 - 2:00 At check-out, patient contracts for safety.?Patient demonstrates progress as evidenced by continued engagement and by being receptive to treatment. Patient denies SI/HI/self-harm thoughts at the end of group and agrees to seek help should those thoughts/feelings occur.?     Suicidal/Homicidal: Nowithout intent/plan  Plan: Pt will continue in PHP while working to decrease depression and anxiety symptoms, increase daily functioning, and increase ability to manage symptoms in a healthy manner.   Collaboration of Care: Medication Management AEB T Lewis, NP  Patient/Guardian was advised Release of Information must be obtained prior to any record release in order to collaborate their care with an outside provider. Patient/Guardian was advised if they have not already done so to contact the registration department to sign all necessary forms in order for us  to release information regarding their care.   Consent: Patient/Guardian gives verbal consent for treatment and assignment of benefits for services provided during this visit. Patient/Guardian expressed understanding and agreed to proceed.   Diagnosis: Bipolar II disorder (HCC) [F31.81]    1. Bipolar II disorder (HCC)       Randall Bastos, LCSW

## 2023-12-22 NOTE — Psych (Signed)
 [facility] BH PHP THERAPIST PROGRESS NOTE  Jacinto Kelly [id number]  Session Time: 9:00 - 10:00  Participation Level: Active  Behavioral Response: CasualAlertAnxious and Depressed  Type of Therapy: Group Therapy  Treatment Goals addressed: Coping  Progress Towards Goals: Initial  Interventions: CBT, DBT, Supportive, and Reframing  Summary: Nahom Sherlin is a 43 y.o. adult who presents with depression and anxiety symptoms.  Clinician led check-in regarding current stressors and situation, and review of patient completed daily inventory. Clinician utilized active listening and empathetic response and validated patient emotions. Clinician facilitated processing group on pertinent issues.?   Summary: Patient arrived within time allowed. Patient rates their depression at a 6 and anxiety at a 7 on a scale of 1-10 with 10 being high. Pt reports they slept 3 hours last night and ate 3x yesterday. Pt reports he took a nap, went to the gym, talked to his friend on the phone, and had dinner with his parents. Pt reports unoccupied time is difficult for him to manage as his brain begins ruminating on the changes in his life and where to go from here. Pt denied experiencing SI/SH thoughts. Pt able to process.?Pt engaged in discussion.?       Session Time: 10:00 am - 11:00 am  Participation Level: Active  Behavioral Response: CasualAlertAnxious and Depressed  Type of Therapy: Group Therapy  Treatment Goals addressed: Coping  Progress Towards Goals: Initial  Interventions: CBT, DBT, Solution Focused, Strength-based, Supportive, and Reframing  Therapist Response: Cln led discussion on forgiveness. Group members shared ways in which they struggle with forgiveness and how it has hurt them. Cln provided space for group to process. Cln encouraged pt's to consider forgiveness as a journey to free themselves from something holding them back.   Summary: Pt engaged in discussion and is able to process.        Session Time: 11:00 am - 12:00 pm  Participation Level: Active  Behavioral Response: CasualAlertAnxious  Type of Therapy: Group Therapy  Treatment Goals addressed: Coping  Progress Towards Goals: Progressing  Interventions: CBT, DBT, Solution Focused, Strength-based, Supportive, and Reframing  Therapist Response: Cln led discussion on how to fill unplanned time. Group members shared ways in which downtime negatively impacts mental health and often causes them to dwell on negative thinking. Group brainstormed ways to manage down time and problem solved how to handle barriers.   Summary: Pt engaged in discussion.     Session Time: 12:00 pm - 1:00 pm  Participation Level: Active  Behavioral Response: CasualAlertAnxious  Type of Therapy: Group Therapy  Treatment Goals addressed: Coping  Progress Towards Goals: Progressing  Interventions: CBT, DBT, Supportive, Reframing  Therapist Response: 12:00 - 12:50 Cln led coping skill toolbox activity in which group members compiled DBT coping skills and created tangible reminders of how and when to utilize the skills. Each group member personalized the skills to their preferences and made a kit they can go to that holds everything they need to cope when elevated.  12:50 - 1:00 pm: Clinician led check-out. Clinician assessed for immediate needs, medication compliance and efficacy, and safety concerns.  Summary: 12:00 - 12:50 Pt engaged and participated in activity.  12:50 - 1:00 At check-out, patient contracts for safety.?Patient demonstrates progress as evidenced by her engagement and by being receptive to treatment. Patient denies SI/HI/self-harm thoughts at the end of group and agrees to seek help should those thoughts/feelings occur.?     Suicidal/Homicidal: Nowithout intent/plan  Plan: Pt will continue in PHP while working to  decrease depression and anxiety symptoms, increase daily functioning, and increase ability to  manage symptoms in a healthy manner.   Collaboration of Care: Medication Management AEB T Lewis, NP  Patient/Guardian was advised Release of Information must be obtained prior to any record release in order to collaborate their care with an outside provider. Patient/Guardian was advised if they have not already done so to contact the registration department to sign all necessary forms in order for us  to release information regarding their care.   Consent: Patient/Guardian gives verbal consent for treatment and assignment of benefits for services provided during this visit. Patient/Guardian expressed understanding and agreed to proceed.   Diagnosis: Bipolar II disorder (HCC) [F31.81]    1. Bipolar II disorder (HCC)       Randall Bastos, LCSW

## 2023-12-25 ENCOUNTER — Ambulatory Visit (HOSPITAL_COMMUNITY)

## 2023-12-25 ENCOUNTER — Ambulatory Visit (INDEPENDENT_AMBULATORY_CARE_PROVIDER_SITE_OTHER): Admitting: Licensed Clinical Social Worker

## 2023-12-25 DIAGNOSIS — R4589 Other symptoms and signs involving emotional state: Secondary | ICD-10-CM

## 2023-12-25 DIAGNOSIS — F3181 Bipolar II disorder: Secondary | ICD-10-CM

## 2023-12-25 NOTE — Psych (Signed)
 [facility] BH PHP THERAPIST PROGRESS NOTE  Todd Tucker [id number]   Session Time: 9:00 am - 10:00 am  Participation Level: Active  Behavioral Response: CasualAlertAnxious and Depressed  Type of Therapy: Group Therapy  Treatment Goals addressed: Coping  Progress Towards Goals: Progressing  Interventions: CBT, DBT, Solution Focused, Strength-based, Supportive, and Reframing  Therapist Response: Clinician led check-in regarding current stressors and situation, and review of patient completed daily inventory. Clinician utilized active listening and empathetic response and validated patient emotions. Clinician facilitated processing group on pertinent issues.?   Summary: Patient arrived within time allowed. Patient rates their depression at a 4 and anxiety at a 3 on a scale of 1-10 with 10 being best. Pt reports his weekend went well and overall he is feeling good. He shares that he drove to [place] on Friday to sign divorce-related paperwork with his wife, who he states did not say anything to him at all. When asked about sleep and appetite, pt reports they slept 6 hours last night and ate 5 meals yesterday. Pt denied experiencing SI/SH thoughts and feelings of hopelessness since last session. Pt able to process.?Pt engaged in discussion.?      Session Time: 10:00 am - 11:00 am  Participation Level: Active  Behavioral Response: CasualAlertAnxious and Depressed  Type of Therapy: Group Therapy  Treatment Goals addressed: Coping  Progress Towards Goals: Progressing  Interventions: CBT, DBT, Solution Focused, Strength-based, Supportive, and Reframing  Therapist Response: Clinician led processing group for pt's current struggles. Group members shared stressors and provided support and feedback. Clinician brought in topics of cognitive distortions and automatic negative thoughts to inform discussion.  Summary: Pt able to process and provide support to group.     Session Time: 11:00 am  - 12:00 pm  Participation Level: Active  Behavioral Response: CasualAlertAnxious and Depressed  Type of Therapy: Group Therapy  Treatment Goals addressed: Coping  Progress Towards Goals: Progressing  Interventions: CBT, DBT, Solution Focused, Strength-based, Supportive, and Reframing  Therapist Response: Clinician led discussion on cognitive defusion and automatic negative thoughts. Cln reviewed thought defusion techniques utilizing a handout which explains different methods for thought defusion. Lastly, patients were asked to demonstrate cognitive defusion using a silly voice.   Summary: Pt engaged in discussion and demonstrated good insight into the subject matter.   Session Time: 12:00 pm - 12:30 pm  Participation Level: Active  Behavioral Response: CasualAlertAnxious and Depressed  Type of Therapy: Group Therapy  Treatment Goals addressed: Coping  Progress Towards Goals: Progressing  Interventions: Psychologist, Occupational, Supportive  Therapist Response: Reflection Group: Patients encouraged to practice skills and interpersonal techniques or work on mindfulness and relaxation techniques. The importance of self-care and making skills part of a routine to increase usage were stressed.  Summary: Patient engaged and participated appropriately.   Session Time: 12:30 pm - 1:30 pm  Participation Level: Active  Behavioral Response: CasualAlertAnxious and Depressed  Type of Therapy: Group Therapy  Treatment Goals addressed: Coping  Progress Towards Goals: Progressing  Interventions: CBT, DBT, Solution Focused, Strength-based, Supportive, and Reframing  Therapist Response: Group was led by occupational therapist, Edward Hollan.   Summary: Pt engaged and participated in discussion.   Session Time: 1:30 pm - 1:45 pm  Participation Level: Active  Behavioral Response: CasualAlertAnxious and Depressed  Type of Therapy: Group Therapy  Treatment Goals addressed:  Coping  Progress Towards Goals: Progressing  Interventions: CBT, DBT, Solution Focused, Strength-based, Supportive, and Reframing  Therapist Response: Clinician led check-out. Clinician assessed for immediate needs, medication  compliance and efficacy, and safety concerns?  Summary: At check-out, patient contracts for safety.?Patient demonstrates progress as evidenced by his continued engagement and by being receptive to treatment. Patient denies SI/HI/self-harm thoughts at the end of group and agrees to seek help should those thoughts/feelings occur.?    Suicidal/Homicidal: Nowithout intent/plan  Plan: ?Pt Todd continue in PHP and medication management while continuing to work on decreasing depression symptoms,?SI, and anxiety symptoms,?and increasing the ability to self manage symptoms.    Collaboration of Care: Medication Management AEB [name], NP  Patient/Guardian was advised Release of Information must be obtained prior to any record release in order to collaborate their care with an outside provider. Patient/Guardian was advised if they have not already done so to contact the registration department to sign all necessary forms in order for us  to release information regarding their care.   Consent: Patient/Guardian gives verbal consent for treatment and assignment of benefits for services provided during this visit. Patient/Guardian expressed understanding and agreed to proceed.   Diagnosis: Bipolar II disorder (HCC) [F31.81]    1. Bipolar II disorder [facility])       Todd LILLETTE Pollack, LCSW 12/25/2023

## 2023-12-26 ENCOUNTER — Encounter (HOSPITAL_COMMUNITY): Payer: Self-pay

## 2023-12-26 ENCOUNTER — Ambulatory Visit (HOSPITAL_COMMUNITY)

## 2023-12-26 ENCOUNTER — Ambulatory Visit (INDEPENDENT_AMBULATORY_CARE_PROVIDER_SITE_OTHER): Admitting: Professional

## 2023-12-26 DIAGNOSIS — R4589 Other symptoms and signs involving emotional state: Secondary | ICD-10-CM

## 2023-12-26 DIAGNOSIS — F3181 Bipolar II disorder: Secondary | ICD-10-CM | POA: Diagnosis not present

## 2023-12-26 NOTE — Progress Notes (Signed)
 Spoke with patient in person for PHP. States that groups are going well. Did have a complaint about the counselor who did group yesterday. States she was rude, disrespectful, and unprofessional in how she handled a situation with a disabled patient. He was so upset that he states he will not be returning to group on the day she works. Issue was addressed with management. Otherwise he enjoys the other counselors and groups. On scale 1-10 as 10 being worst he rates depression at 2 and anxiety at 2. Denies SI/HI or AVH. Appropriate affect. Smiling and making jokes. No other issues or complaints. No side effects from medications.

## 2023-12-26 NOTE — Psych (Signed)
 [facility] BH PHP THERAPIST PROGRESS NOTE  Vidal Escher [id number]   Session Time: 9:00 am - 10:00 am  Participation Level: Active  Behavioral Response: CasualAlertEuthymic  Type of Therapy: Group Therapy  Treatment Goals addressed: Coping  Progress Towards Goals: Progressing  Interventions: CBT, DBT, Solution Focused, Strength-based, Supportive, and Reframing  Therapist Response: Clinician led check-in regarding current stressors and situation, and review of patient completed daily inventory. Clinician utilized active listening and empathetic response and validated patient emotions. Clinician facilitated processing group on pertinent issues.?   Summary: Patient arrived within time allowed. Patient rates their depression at a 2 and anxiety at a 4 on a scale of 1-10 with 10 being best. When asked about sleep and appetite, pt reports they slept 7 hours last night and ate 5x yesterday. Pt reports he was frustrated when he left group yesterday due to some interactions between the therapist and group members. Pt reports he felt therapist was condescending in her interactions with group, and was visibly overwhelmed and frustrated. Pt reports he was able to see progress in self when dealing with this situation by not blowing up at the therapist, and not ruminating on the experience after leaving group. Pt reports he does not want to engage with that therapist any longer and will skip group next Monday to miss any further interactions. Cln spent time discussing this and making sure pt felt comfortable in treatment and decisions moving forward. He was able to be productive and apply for Medicaid, go to the gym, and have dinner with his parents. Pt denied experiencing SI/SH thoughts or feelings of hopelessness since last session. Pt able to process.?Pt engaged in discussion.?      Session Time: 10:00 am - 11:00 am  Participation Level: Active  Behavioral Response: CasualAlertEuthymic  Type of  Therapy: Group Therapy  Treatment Goals addressed: Coping  Progress Towards Goals: Progressing  Interventions: CBT, DBT, Solution Focused, Strength-based, Supportive, and Reframing  Therapist Response: Clinician led processing group for pt's current struggles. Group members shared stressors and provided support and feedback. Clinician strength in control and making decisions using Wise mind..  Summary: Pt able to process and provide support to group.      Session Time: 11:00 am - 12:00 pm   Participation Level: Active   Behavioral Response: Casual Alert and Anxious/Depressed   Type of Therapy: Group Therapy   Treatment Goals addressed: Coping   Progress Towards Goals: Progressing   Interventions: CBT, DBT, Solution Focused, Strength-based, Supportive, and Reframing   Therapist Response: Group was led by [facility] chaplain, Amy Delores.   Summary: Pt engaged in discussion.      Session Time: 12:00 pm - 12:30 pm   Participation Level: Active   Behavioral Response: Casual Alert and Anxious/Depressed   Type of Therapy: Group Therapy   Treatment Goals addressed: Coping   Progress Towards Goals: Progressing   Interventions: Psychologist, Occupational, Supportive   Therapist Response: Reflection Group: Patients encouraged to practice skills and interpersonal techniques or work on mindfulness and relaxation techniques. The importance of self-care and making skills part of a routine to increase usage were stressed.   Summary: Pt engaged in discussion.      Session Time: 12:30 pm - 1:30 pm   Participation Level: Active   Behavioral Response: Casual Alert and Anxious/Depressed   Type of Therapy: Group Therapy   Treatment Goals addressed: Coping   Progress Towards Goals: Progressing   Interventions: OT group   Therapist Response: Group was led by  occupational therapist, Edward Hollan.    Summary: Pt engaged in discussion.      Session Time: 1:30 pm - 1:45 pm    Participation Level: Active   Behavioral Response: Casual Alert and Anxious/Depressed   Type of Therapy: Group Therapy   Treatment Goals addressed: Coping   Progress Towards Goals: Progressing   Interventions: CBT, DBT, Solution Focused, Strength-based, Supportive, and Reframing   Therapist Response: 1:30-1:45pm: Clinician led check-out. Clinician assessed for immediate needs, medication compliance and efficacy, and safety concerns?   Summary: 1:30-1:45pm: At check-out, patient reports no immediate concerns. Patient demonstrates progress as evidenced by engagement and responsiveness to treatment. Patient denies SI/HI/self-harm thoughts at the end of group.   Suicidal/Homicidal: Nowithout intent/plan  Plan: ?Pt will continue in PHP and medication management while continuing to work on decreasing depression symptoms,?SI, and anxiety symptoms,?and increasing the ability to self manage symptoms.     Collaboration of Care: Medication Management AEB [name], NP and Other [name], RN  Patient/Guardian was advised Release of Information must be obtained prior to any record release in order to collaborate their care with an outside provider. Patient/Guardian was advised if they have not already done so to contact the registration department to sign all necessary forms in order for us  to release information regarding their care.   Consent: Patient/Guardian gives verbal consent for treatment and assignment of benefits for services provided during this visit. Patient/Guardian expressed understanding and agreed to proceed.   Diagnosis: Bipolar II disorder (HCC) [F31.81]    1. Bipolar II disorder [facility])       [name], [facility] 12/26/2023

## 2023-12-26 NOTE — Therapy (Signed)
 [place] [facility] [address] [place], [place], [address] Phone: [phone]   Fax:  [phone]  Occupational Therapy Treatment  Patient Details  Name: Todd Tucker MRN: [id number] Date of Birth: 1980/03/27 No data recorded  Encounter Date: 12/19/2023   OT End of Session - 12/26/23 2116     Visit Number 4    Number of Visits 15    Date for Recertification  12/28/23    OT Start Time 1230    OT Stop Time 1330    OT Time Calculation (min) 60 min          Past Medical History:  Diagnosis Date   Anxiety    Depression    OCD (obsessive compulsive disorder)    Sleep apnea     Past Surgical History:  Procedure Laterality Date   KNEE SURGERY Bilateral    KNEE SURGERY      There were no vitals filed for this visit.   Subjective Assessment - 12/26/23 2115     Currently in Pain? No/denies    Pain Score 0-No pain              Group Session:  S: doing better  O: During the group therapy session, the occupational therapist discussed the impact of sleep disturbances on daily activities and overall health and wellbeing.   The OT also reviewed various types of sleep disorders, including insomnia, sleep apnea, restless leg syndrome, and narcolepsy, and their associated symptoms. Strategies for managing and treating sleep disturbances were also discussed, such as establishing a consistent sleep routine, avoiding stimulants before bedtime, and engaging in relaxation techniques.   Today's group also included information on how sleep disturbances can cause fatigue, mood changes, cognitive impairment, and physical health problems, and emphasizes the importance of seeking prompt treatment to maintain overall health and wellbeing.   A: In today's session, the patient demonstrated active engagement with the topic of The Importance of Sleep. They eagerly asked questions, contributed personal experiences, and showcased a noticeable eagerness to  apply the discussed principles. Their participation indicated not only a strong understanding of the subject matter but also an intrinsic motivation to implement better sleep practices in their daily routine. Based on their proactive involvement, it is assessed that the patient greatly benefited from today's treatment and will likely make efforts to incorporate the insights gained.    P: Continue to attend PHP OT group sessions 5x week for 4 weeks to promote daily structure, social engagement, and opportunities to develop and utilize adaptive strategies to maximize functional performance in preparation for safe transition and integration back into school, work, and the community. Plan to address topic of sleep cont'd in next OT group session.                  OT Education - 12/26/23 2116     Education Details Sleep           OT Short Term Goals - 12/17/23 2052       OT SHORT TERM GOAL #1   Title Patient will develop and implement a structured daily routine that incorporates at least one productive, one self-care, and one leisure activity to improve consistency, balance, and sense of purpose throughout the week.    Time 4    Period Weeks    Status On-going    Target Date 12/28/23      OT SHORT TERM GOAL #2   Title Patient will identify one personal goal related to  daily functioning or wellness and apply the SMART goal framework to create a clear, measurable, and achievable plan to improve engagement in meaningful roles.    Status On-going      OT SHORT TERM GOAL #3   Title Patient will demonstrate improved motivation and initiation by identifying barriers to engagement and using at least one strategy (e.g., breaking tasks into smaller steps, scheduling, or reward systems) to follow through on selected activities.    Status On-going                   Plan - 12/26/23 2117     Psychosocial Skills Interpersonal Interaction;Habits;Environmental   Adaptations;Routines and Behaviors;Coping Strategies          Patient will benefit from skilled therapeutic intervention in order to improve the following deficits and impairments:       Psychosocial Skills: Interpersonal Interaction, Habits, Environmental  Adaptations, Routines and Behaviors, Coping Strategies   Visit Diagnosis: Difficulty coping    Problem List Patient Active Problem List   Diagnosis Date Noted   MDD (major depressive disorder), recurrent severe, 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   Major depressive disorder, recurrent episode, severe (HCC) 12/05/2023   Generalized anxiety disorder 12/05/2023   Cluster C personality disorder (HCC) 12/05/2023   B12 deficiency 12/05/2023   Excoriation (skin-picking) disorder 12/05/2023   Major depressive disorder, recurrent episode, moderate (HCC) 12/02/2023   Posttraumatic stress disorder 11/04/2017   Bipolar II disorder (HCC) 11/04/2017    Todd Tucker, OT 12/26/2023, 9:17 PM  Todd Tucker, OT   [place] [facility] [address] [place], [place], [address] Phone: [phone]   Fax:  [phone]  Name: Todd Tucker MRN: [id number] Date of Birth: [birth year]

## 2023-12-27 ENCOUNTER — Ambulatory Visit (HOSPITAL_COMMUNITY)

## 2023-12-27 ENCOUNTER — Ambulatory Visit (INDEPENDENT_AMBULATORY_CARE_PROVIDER_SITE_OTHER): Admitting: Licensed Clinical Social Worker

## 2023-12-27 ENCOUNTER — Encounter (HOSPITAL_COMMUNITY): Payer: Self-pay

## 2023-12-27 DIAGNOSIS — F3181 Bipolar II disorder: Secondary | ICD-10-CM

## 2023-12-27 DIAGNOSIS — F332 Major depressive disorder, recurrent severe without psychotic features: Secondary | ICD-10-CM

## 2023-12-27 NOTE — Therapy (Signed)
 [place] [facility] [address] [place], [place], [address] Phone: [phone]   Fax:  [phone]  Occupational Therapy Treatment  Patient Details  Name: Todd Tucker MRN: [id number] Date of Birth: [birth year] No data recorded  Encounter Date: 12/20/2023   OT End of Session - 12/27/23 1956     Visit Number 5    Number of Visits 15    Date for Recertification  12/28/23    OT Start Time 1230    OT Stop Time 1330    OT Time Calculation (min) 60 min          Past Medical History:  Diagnosis Date   Anxiety    Depression    OCD (obsessive compulsive disorder)    Sleep apnea     Past Surgical History:  Procedure Laterality Date   KNEE SURGERY Bilateral    KNEE SURGERY      There were no vitals filed for this visit.   Subjective Assessment - 12/27/23 1956     Currently in Pain? No/denies    Pain Score 0-No pain               Group Session:  S: Doing better today.   O: During the group therapy session, the occupational therapist discussed the impact of sleep disturbances on daily activities and overall health and wellbeing.   The OT also reviewed various types of sleep disorders, including insomnia, sleep apnea, restless leg syndrome, and narcolepsy, and their associated symptoms. Strategies for managing and treating sleep disturbances were also discussed, such as establishing a consistent sleep routine, avoiding stimulants before bedtime, and engaging in relaxation techniques.   Today's group also included information on how sleep disturbances can cause fatigue, mood changes, cognitive impairment, and physical health problems, and emphasizes the importance of seeking prompt treatment to maintain overall health and wellbeing.   A: In today's session, the patient demonstrated active engagement with the topic of The Importance of Sleep. They eagerly asked questions, contributed personal experiences, and showcased a noticeable  eagerness to apply the discussed principles. Their participation indicated not only a strong understanding of the subject matter but also an intrinsic motivation to implement better sleep practices in their daily routine. Based on their proactive involvement, it is assessed that the patient greatly benefited from today's treatment and will likely make efforts to incorporate the insights gained.    P: Continue to attend PHP OT group sessions 5x week for 4 weeks to promote daily structure, social engagement, and opportunities to develop and utilize adaptive strategies to maximize functional performance in preparation for safe transition and integration back into school, work, and the community. Plan to address topic of tbd in next OT group session.                 OT Education - 12/27/23 1956     Education Details Sleep           OT Short Term Goals - 12/17/23 2052       OT SHORT TERM GOAL #1   Title Patient will develop and implement a structured daily routine that incorporates at least one productive, one self-care, and one leisure activity to improve consistency, balance, and sense of purpose throughout the week.    Time 4    Period Weeks    Status On-going    Target Date 12/28/23      OT SHORT TERM GOAL #2   Title Patient will identify one personal goal related  to daily functioning or wellness and apply the SMART goal framework to create a clear, measurable, and achievable plan to improve engagement in meaningful roles.    Status On-going      OT SHORT TERM GOAL #3   Title Patient will demonstrate improved motivation and initiation by identifying barriers to engagement and using at least one strategy (e.g., breaking tasks into smaller steps, scheduling, or reward systems) to follow through on selected activities.    Status On-going                   Plan - 12/27/23 1957     Psychosocial Skills Interpersonal Interaction;Habits;Environmental   Adaptations;Routines and Behaviors;Coping Strategies          Patient will benefit from skilled therapeutic intervention in order to improve the following deficits and impairments:       Psychosocial Skills: Interpersonal Interaction, Habits, Environmental  Adaptations, Routines and Behaviors, Coping Strategies   Visit Diagnosis: Difficulty coping    Problem List Patient Active Problem List   Diagnosis Date Noted   MDD (major depressive disorder), recurrent severe, 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   Major depressive disorder, recurrent episode, severe (HCC) 12/05/2023   Generalized anxiety disorder 12/05/2023   Cluster C personality disorder (HCC) 12/05/2023   B12 deficiency 12/05/2023   Excoriation (skin-picking) disorder 12/05/2023   Major depressive disorder, recurrent episode, moderate (HCC) 12/02/2023   Posttraumatic stress disorder 11/04/2017   Bipolar II disorder (HCC) 11/04/2017    [name], OT 12/27/2023, 7:57 PM  D[place] Purpura, OT    [place] [facility] [address] [place], [place], [address] Phone: [phone]   Fax:  [phone]  Name: Todd Tucker MRN: [id number] Date of Birth: Dec 27, 1980

## 2023-12-28 ENCOUNTER — Ambulatory Visit (INDEPENDENT_AMBULATORY_CARE_PROVIDER_SITE_OTHER): Admitting: Licensed Clinical Social Worker

## 2023-12-28 ENCOUNTER — Ambulatory Visit (HOSPITAL_COMMUNITY)

## 2023-12-28 DIAGNOSIS — R4589 Other symptoms and signs involving emotional state: Secondary | ICD-10-CM

## 2023-12-28 DIAGNOSIS — F3181 Bipolar II disorder: Secondary | ICD-10-CM | POA: Diagnosis not present

## 2023-12-29 ENCOUNTER — Ambulatory Visit (HOSPITAL_COMMUNITY)

## 2023-12-29 ENCOUNTER — Ambulatory Visit (INDEPENDENT_AMBULATORY_CARE_PROVIDER_SITE_OTHER): Admitting: Licensed Clinical Social Worker

## 2023-12-29 DIAGNOSIS — R4589 Other symptoms and signs involving emotional state: Secondary | ICD-10-CM

## 2023-12-29 DIAGNOSIS — F3181 Bipolar II disorder: Secondary | ICD-10-CM | POA: Diagnosis not present

## 2023-12-30 NOTE — Progress Notes (Signed)
 [facility] MD/PA/NP OP Progress Note  12/30/2023 10:01 AM Todd Tucker  MRN:  [id number]  Chief Complaint: Weekly, progress assessment while attending partial hospitalization program  HPI: Todd Tucker 43 year old male was seen and evaluated face-to-face by this provider.  There is a diagnoses related to major depressive disorder, generalized anxiety disorder, posttraumatic stress disorder and bipolar disorder.  Discharged from inpatient admission due to passive suicidal ideations and worsening depressive symptoms.  Currently prescribed Lamictal , Effexor and hydroxyzine.  Was initiated on doxepin for sleep disturbance during this admission.  States overall his sleep has improved with starting doxepin.  Continues to utilize hydroxyzine for reported anxiety.  Rating his depression and anxiety 4 out of 10 with 10 being the worst.  Patient appears future and goal oriented as he states he has plans to return back to school to become a registered nurse in order to work on mental health.  Reports a good appetite states she is resting well throughout the night.  Todd Tucker  is alert/oriented x 4; calm/cooperative; and mood congruent with affect.  Patient is speaking in a clear tone at moderate volume, and normal pace; with good eye contact.  His  thought process is coherent and relevant; There is no indication that he is currently responding to internal/external stimuli or experiencing delusional thought content.  Patient denies suicidal/self-harm/homicidal ideation, psychosis, and paranoia.  Patient has remained calm throughout assessment and has answered questions appropriatel   Visit Diagnosis:    ICD-10-CM   1. MDD (major depressive disorder), recurrent severe, without psychosis (HCC)  F33.2     2. Bipolar II disorder (HCC)  F31.81       Past Psychiatric History: H/O: Major depressive disorder, cluster C personality disorder, generalized anxiety disorder, major depressive disorder and posttraumatic stress  disorder  Past Medical History:  Past Medical History:  Diagnosis Date   Anxiety    Depression    OCD (obsessive compulsive disorder)    Sleep apnea     Past Surgical History:  Procedure Laterality Date   KNEE SURGERY Bilateral    KNEE SURGERY      Family Psychiatric History: Denied  Family History:  Family History  Problem Relation Age of Onset   Anxiety disorder Mother    CVA Father    ADD / ADHD Sister     Social History:  Social History   Socioeconomic History   Marital status: Legally Separated    Spouse name: Not on file   Number of children: 0   Years of education: Not on file   Highest education level: Master's degree (e.g., MA, MS, MEng, MEd, MSW, MBA)  Occupational History   Not on file  Tobacco Use   Smoking status: Never   Smokeless tobacco: Never  Vaping Use   Vaping status: Never Used  Substance and Sexual Activity   Alcohol use: Yes    Comment: rarely   Drug use: No   Sexual activity: Not on file  Other Topics Concern   Not on file  Social History Narrative   Not on file   Social Drivers of Health   Financial Resource Strain: Patient Declined (05/27/2022)   Received from [facility]   Overall Financial Resource Strain (CARDIA)    Difficulty of Paying Living Expenses: Patient declined  Food Insecurity: No Food Insecurity (12/04/2023)   Hunger Vital Sign    Worried About Running Out of Food in the Last Year: Never true    Ran Out of Food in the Last Year:  Never true  Transportation Needs: No Transportation Needs (12/04/2023)   PRAPARE - Administrator, Civil Service (Medical): No    Lack of Transportation (Non-Medical): No  Physical Activity: Not on file  Stress: Not on file  Social Connections: Unknown (05/17/2022)   Received from [facility]   Social Network    Social Network: Not on file    Allergies: No Known Allergies  Metabolic Disorder Labs: Lab Results  Component Value Date   HGBA1C 4.5 (L) 12/04/2023    MPG 82.45 12/04/2023   Lab Results  Component Value Date   PROLACTIN 8.9 12/04/2023   Lab Results  Component Value Date   CHOL 178 12/04/2023   TRIG 169 (H) 12/04/2023   HDL 33 (L) 12/04/2023   CHOLHDL 5.4 12/04/2023   VLDL 34 12/04/2023   LDLCALC 111 (H) 12/04/2023   Lab Results  Component Value Date   TSH 1.865 12/04/2023    Therapeutic Level Labs: No results found for: LITHIUM No results found for: VALPROATE No results found for: CBMZ  Current Medications: Current Outpatient Medications  Medication Sig Dispense Refill   cyanocobalamin 1000 MCG tablet Take 1 tablet (1,000 mcg total) by mouth daily. 100 tablet 0   doxepin (SINEQUAN) 50 MG capsule Take 1 capsule (50 mg total) by mouth at bedtime. 30 capsule 0   hydrOXYzine (ATARAX) 25 MG tablet Take 1 tablet (25 mg total) by mouth 3 (three) times daily as needed for anxiety. And for sleep as needed. 45 tablet 0   lamoTRIgine  (LAMICTAL ) 200 MG tablet Take 1 tablet (200 mg total) by mouth daily. 30 tablet 0   venlafaxine XR (EFFEXOR-XR) 150 MG 24 hr capsule Take 1 capsule (150 mg total) by mouth daily with breakfast. 30 capsule 0   No current facility-administered medications for this visit.     Musculoskeletal: Strength & Muscle Tone: within normal limits Gait & Station: normal Patient leans: N/A  Psychiatric Specialty Exam: Review of Systems  There were no vitals taken for this visit.There is no height or weight on file to calculate BMI.  General Appearance: Casual  Eye Contact:  Good  Speech:  Clear and Coherent  Volume:  Normal  Mood:  Anxious and Depressed  Affect:  Congruent  Thought Process:  Coherent  Orientation:  Full (Time, Place, and Person)  Thought Content: Logical   Suicidal Thoughts:  No  Homicidal Thoughts:  No  Memory:  Immediate;   Good Recent;   Good  Judgement:  Good  Insight:  Good  Psychomotor Activity:  Normal  Concentration:  Concentration: Good  Recall:  Good  Fund of  Knowledge: Good  Language: Good  Akathisia:  No  Handed:  Right  AIMS (if indicated): not done  Assets:  Communication Skills Desire for Improvement  ADL's:  Intact  Cognition: WNL  Sleep:  Good   Screenings: AUDIT    Flowsheet Row [facility]  Alcohol Use Disorder Identification Test Final Score (AUDIT) 0   GAD-7    Flowsheet Row Counselor from 12/25/2023 in [facility] Counselor from 12/18/2023 in [facility]  Total GAD-7 Score 16 21   PHQ2-9    Flowsheet Row Counselor from 12/25/2023 in [facility] Counselor from 12/19/2023 in [facility] Counselor from 12/18/2023 in [facility] Counselor from 12/11/2023 in [facility]  PHQ-2 Total Score  3 4 4 6   PHQ-9 Total Score 15 18 18 21    Flowsheet Row Counselor from 12/19/2023 in [facility] Counselor from 12/11/2023 in [facility] Admission (Discharged[facility]  C-SSRS RISK CATEGORY Error: Q3, 4, or 5 should not be populated when Q2 is No Error: Q3, 4, or 5 should not be populated when Q2 is No Low Risk     Assessment and Plan:  Continue partial hospitalization programming PHP Continue medications as directed  Collaboration of Care: Collaboration of Care: Medication Management AEB Effexor, Lamictal  and doxepin  Patient/Guardian was advised Release of Information must be obtained prior to any record release in order to collaborate their care with an outside provider. Patient/Guardian was advised if they have not already done so to contact the registration department to sign all necessary forms in order for us  to release information regarding their care.   Consent:  Patient/Guardian gives verbal consent for treatment and assignment of benefits for services provided during this visit. Patient/Guardian expressed understanding and agreed to proceed.    [name], NP 12/30/2023, 10:01 AM

## 2024-01-01 ENCOUNTER — Ambulatory Visit (HOSPITAL_COMMUNITY)

## 2024-01-01 ENCOUNTER — Encounter (HOSPITAL_COMMUNITY): Payer: Self-pay

## 2024-01-01 ENCOUNTER — Ambulatory Visit (INDEPENDENT_AMBULATORY_CARE_PROVIDER_SITE_OTHER): Admitting: Licensed Clinical Social Worker

## 2024-01-01 DIAGNOSIS — F3181 Bipolar II disorder: Secondary | ICD-10-CM

## 2024-01-01 DIAGNOSIS — R4589 Other symptoms and signs involving emotional state: Secondary | ICD-10-CM

## 2024-01-01 NOTE — Therapy (Signed)
 [place] [facility] [address] [place], [place], [address] Phone: [phone]   Fax:  [phone]  Occupational Therapy Treatment  Patient Details  Name: Todd Tucker MRN: [id number] Date of Birth: 12/26/1980 No data recorded  Encounter Date: 12/21/2023   OT End of Session - 01/01/24 2107     Visit Number 6    Number of Visits 15    Date for Recertification  12/28/23    OT Start Time 1230    OT Stop Time 1330    OT Time Calculation (min) 60 min          Past Medical History:  Diagnosis Date   Anxiety    Depression    OCD (obsessive compulsive disorder)    Sleep apnea     Past Surgical History:  Procedure Laterality Date   KNEE SURGERY Bilateral    KNEE SURGERY      There were no vitals filed for this visit.   Subjective Assessment - 01/01/24 2106     Currently in Pain? No/denies    Pain Score 0-No pain              Group Session:  S: Doing better today overall.   O: During the group therapy session, the occupational therapist discussed the impact of sleep disturbances on daily activities and overall health and wellbeing.   The OT also reviewed various types of sleep disorders, including insomnia, sleep apnea, restless leg syndrome, and narcolepsy, and their associated symptoms. Strategies for managing and treating sleep disturbances were also discussed, such as establishing a consistent sleep routine, avoiding stimulants before bedtime, and engaging in relaxation techniques.   Today's group also included information on how sleep disturbances can cause fatigue, mood changes, cognitive impairment, and physical health problems, and emphasizes the importance of seeking prompt treatment to maintain overall health and wellbeing.   A: In today's session, the patient demonstrated active engagement with the topic of The Importance of Sleep. They eagerly asked questions, contributed personal experiences, and showcased a noticeable  eagerness to apply the discussed principles. Their participation indicated not only a strong understanding of the subject matter but also an intrinsic motivation to implement better sleep practices in their daily routine. Based on their proactive involvement, it is assessed that the patient greatly benefited from today's treatment and will likely make efforts to incorporate the insights gained.   P: Continue to attend PHP OT group sessions 5x week for 4 weeks to promote daily structure, social engagement, and opportunities to develop and utilize adaptive strategies to maximize functional performance in preparation for safe transition and integration back into school, work, and the community. Plan to address topic of tbd in next OT group session.                  OT Education - 01/01/24 2107     Education Details Sleep           OT Short Term Goals - 12/17/23 2052       OT SHORT TERM GOAL #1   Title Patient will develop and implement a structured daily routine that incorporates at least one productive, one self-care, and one leisure activity to improve consistency, balance, and sense of purpose throughout the week.    Time 4    Period Weeks    Status On-going    Target Date 12/28/23      OT SHORT TERM GOAL #2   Title Patient will identify one personal goal related  to daily functioning or wellness and apply the SMART goal framework to create a clear, measurable, and achievable plan to improve engagement in meaningful roles.    Status On-going      OT SHORT TERM GOAL #3   Title Patient will demonstrate improved motivation and initiation by identifying barriers to engagement and using at least one strategy (e.g., breaking tasks into smaller steps, scheduling, or reward systems) to follow through on selected activities.    Status On-going                   Plan - 01/01/24 2107     Psychosocial Skills Interpersonal Interaction;Habits;Environmental   Adaptations;Routines and Behaviors;Coping Strategies          Patient will benefit from skilled therapeutic intervention in order to improve the following deficits and impairments:       Psychosocial Skills: Interpersonal Interaction, Habits, Environmental  Adaptations, Routines and Behaviors, Coping Strategies   Visit Diagnosis: Difficulty coping    Problem List Patient Active Problem List   Diagnosis Date Noted   MDD (major depressive disorder), recurrent severe, 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   Major depressive disorder, recurrent episode, severe (HCC) 12/05/2023   Generalized anxiety disorder 12/05/2023   Cluster C personality disorder (HCC) 12/05/2023   B12 deficiency 12/05/2023   Excoriation (skin-picking) disorder 12/05/2023   Major depressive disorder, recurrent episode, moderate (HCC) 12/02/2023   Posttraumatic stress disorder 11/04/2017   Bipolar II disorder (HCC) 11/04/2017    [name], OT 01/01/2024, 9:07 PM  Dallas Purpura, OT   [place] [address] [place], [place], [address] Phone: [phone]   Fax:  [phone]  Name: Todd Tucker MRN: [id number] Date of Birth: 1980-09-14

## 2024-01-01 NOTE — Psych (Signed)
 [facility] BH PHP THERAPIST PROGRESS NOTE  Todd Tucker [id number]   Session Time: 9:00 am - 10:00 am  Participation Level: Active  Behavioral Response: CasualAlertEuthymic  Type of Therapy: Group Therapy  Treatment Goals addressed: Coping  Progress Towards Goals: Progressing  Interventions: CBT, DBT, Solution Focused, Strength-based, Supportive, and Reframing  Therapist Response: Clinician led check-in regarding current stressors and situation, and review of patient completed daily inventory. Clinician utilized active listening and empathetic response and validated patient emotions. Clinician facilitated processing group on pertinent issues.?   Summary: Patient arrived within time allowed. Patient rates their depression at a 5 and anxiety at a 3 on a scale of 1-10 with 10 being best. Pt reports he had a good weekend; he made a new friend, had dinner with extended family, and went to a game. When asked about sleep and appetite, pt reports they slept 7 hours last night and ate 5 meals yesterday. Pt denied experiencing SI/SH thoughts and feelings of hopelessness since last session. Pt able to process.?Pt engaged in discussion.?      Session Time: 10:00 am - 11:00 am  Participation Level: Active  Behavioral Response: CasualAlertEuthymic  Type of Therapy: Group Therapy  Treatment Goals addressed: Coping  Progress Towards Goals: Progressing  Interventions: CBT, DBT, Solution Focused, Strength-based, Supportive, and Reframing  Therapist Response: Clinician led processing group for pt's current struggles. Group members shared stressors and provided support and feedback. Clinician brought in topics of boundaries, giving oneself credit for progress, self-care, and possible trauma responses to yelling to inform discussion.  Summary: Pt able to process and provide support to group.     Session Time: 11:00 am - 12:00 pm  Participation Level: Active  Behavioral Response:  CasualAlertEuthymic  Type of Therapy: Group Therapy  Treatment Goals addressed: Coping  Progress Towards Goals: Progressing  Interventions: CBT, DBT, Solution Focused, Strength-based, Supportive, and Reframing  Therapist Response: Group was led by Benton Devoid and co-led by Will Pollack. Clinician introduced topic of Positive Psychology. Group watched Positive Psychology Ted-Talk. Patients discussed how their lens of life affects the way they feel.   Summary: Pt engaged in discussion.   Session Time: 12:00 pm - 12:30 pm  Participation Level: Active  Behavioral Response: CasualAlertEuthymic  Type of Therapy: Group Therapy  Treatment Goals addressed: Coping  Progress Towards Goals: Progressing  Interventions: Psychologist, Occupational, Supportive  Therapist Response: Reflection Group: Patients encouraged to practice skills and interpersonal techniques or work on mindfulness and relaxation techniques. The importance of self-care and making skills part of a routine to increase usage were stressed.  Summary: Patient engaged and participated appropriately.   Session Time: 12:30 pm - 1:30 pm  Participation Level: Active  Behavioral Response: CasualAlertEuthymic  Type of Therapy: Group Therapy  Treatment Goals addressed: Coping  Progress Towards Goals: Progressing  Interventions: CBT, DBT, Solution Focused, Strength-based, Supportive, and Reframing  Therapist Response: Group was led by occupational therapist, Edward Hollan.   Summary: Pt engaged and participated in discussion.   Session Time: 1:30 pm - 2:00 pm  Participation Level: Active  Behavioral Response: CasualAlertEuthymic  Type of Therapy: Group Therapy  Treatment Goals addressed: Coping  Progress Towards Goals: Progressing  Interventions: CBT, DBT, Solution Focused, Strength-based, Supportive, and Reframing  Therapist Response: 1:30 pm - 1:50 pm: Group was led by Benton Devoid and co-led by  Will Pollack. Clinician continued the topic of Positive Psychology. Group discussed and practiced 5 strategies to help change one's lens. 1:50 - 2:00 pm: Clinician led check-out. Clinician  assessed for immediate needs, medication compliance and efficacy, and safety concerns. Additionally, this cln addressed group last Monday, 11/3, and apologized for behavior and thanked pt for providing feedback so that cln can improve. Pt receptive.  Summary: 1:30 pm - 1:50 pm: Pt participated in discussion and identified random acts of kindness as a new strategy to try for at least 21 days. 1:50 - 2:00 pm: At check-out, patient contracts for safety.?Patient demonstrates progress as evidenced by his continued engagement and by being receptive to treatment. Patient denies SI/HI/self-harm thoughts at the end of group and agrees to seek help should those thoughts/feelings occur.?    Suicidal/Homicidal: Nowithout intent/plan  Plan: ?Pt will continue in PHP and medication management while continuing to work on decreasing depression symptoms,?SI, and anxiety symptoms,?and increasing the ability to self manage symptoms.     Collaboration of Care: Medication Management AEB [name], NP  Patient/Guardian was advised Release of Information must be obtained prior to any record release in order to collaborate their care with an outside provider. Patient/Guardian was advised if they have not already done so to contact the registration department to sign all necessary forms in order for us  to release information regarding their care.   Consent: Patient/Guardian gives verbal consent for treatment and assignment of benefits for services provided during this visit. Patient/Guardian expressed understanding and agreed to proceed.   Diagnosis: Bipolar II disorder (HCC) [F31.81]    1. Bipolar II disorder [facility])       [name], LCSW 01/01/2024

## 2024-01-02 ENCOUNTER — Ambulatory Visit (HOSPITAL_COMMUNITY)

## 2024-01-02 ENCOUNTER — Ambulatory Visit (INDEPENDENT_AMBULATORY_CARE_PROVIDER_SITE_OTHER): Admitting: Professional

## 2024-01-02 DIAGNOSIS — F3181 Bipolar II disorder: Secondary | ICD-10-CM

## 2024-01-02 DIAGNOSIS — R4589 Other symptoms and signs involving emotional state: Secondary | ICD-10-CM

## 2024-01-02 NOTE — Progress Notes (Signed)
 Spoke with patient in person for PHP. This is his last week and he states that it has helped him a lot. Feels good about being discharged and continuing with what he has learned. Bright cheerful affect. On scale 1-10 as 10 being worst he rates depression at 2 and anxiety at 2. Denies SI/HI or AVH. No issues or complaints. No side effects from medications.

## 2024-01-02 NOTE — Psych (Signed)
 [facility] PHP THERAPIST PROGRESS NOTE  Todd Tucker [id number]   Session Time: 9:00 am - 10:00 am  Participation Level: Active  Behavioral Response: CasualAlertEuthymic  Type of Therapy: Group Therapy  Treatment Goals addressed: Coping  Progress Towards Goals: Progressing  Interventions: CBT, DBT, Solution Focused, Strength-based, Supportive, and Reframing  Therapist Response: Clinician led check-in regarding current stressors and situation, and review of patient completed daily inventory. Clinician utilized active listening and empathetic response and validated patient emotions. Clinician facilitated processing group on pertinent issues.?   Summary: Patient arrived within time allowed. Patient rates their depression at a 2 and anxiety at a 2 on a scale of 1-10 with 10 being best. When asked about sleep and appetite, pt reports they slept 7 hours last night and ate 6x yesterday. Pt denied experiencing SI/SH thoughts or feelings of hopelessness since last session. Pt reports he feels like he has made great progress over the last few weeks in PHP and shares that others are noticing, too. He shares that the biggest sign that he is getting better is the return of his since of humor. Pt able to process.?Pt engaged in discussion.?      Session Time: 10:00 am - 11:00 am  Participation Level: Active  Behavioral Response: CasualAlertEuthymic  Type of Therapy: Group Therapy  Treatment Goals addressed: Coping  Progress Towards Goals: Progressing  Interventions: CBT, DBT, Solution Focused, Strength-based, Supportive, and Reframing  Therapist Response: Clinician led processing group for pt's current struggles. Group members shared stressors and provided support and feedback. Clinician brought in topics of black/white thinking, change being difficult and rewarding, and what does right look like to each person at different times.  Summary: Pt able to process and provide support to group.        Session Time: 11:00 am - 12:00 pm   Participation Level: Active   Behavioral Response: CasualAlertEuthymic   Type of Therapy: Group Therapy   Treatment Goals addressed: Coping   Progress Towards Goals: Progressing   Interventions: CBT, DBT, Solution Focused, Strength-based, Supportive, and Reframing   Therapist Response: Group was led by [facility] chaplain, Todd Tucker.   Summary: Pt engaged in discussion.      Session Time: 12:00 pm - 12:30 pm   Participation Level: Active   Behavioral Response: CasualAlertEuthymic   Type of Therapy: Group Therapy   Treatment Goals addressed: Coping   Progress Towards Goals: Progressing   Interventions: Psychologist, Occupational, Supportive   Therapist Response: Reflection Group: Patients encouraged to practice skills and interpersonal techniques or work on mindfulness and relaxation techniques. The importance of self-care and making skills part of a routine to increase usage were stressed.   Summary: Pt engaged in discussion.      Session Time: 12:30 pm - 1:30 pm   Participation Level: Active   Behavioral Response: CasualAlertEuthymic   Type of Therapy: Group Therapy   Treatment Goals addressed: Coping   Progress Towards Goals: Progressing   Interventions: OT group   Therapist Response: Group was led by occupational therapist, Todd Tucker.    Summary: Pt engaged in discussion.      Session Time: 1:30 pm - 1:45 pm   Participation Level: Active   Behavioral Response: CasualAlertEuthymic   Type of Therapy: Group Therapy   Treatment Goals addressed: Coping   Progress Towards Goals: Progressing   Interventions: CBT, DBT, Solution Focused, Strength-based, Supportive, and Reframing   Therapist Response: 1:30-1:45pm: Clinician led check-out. Clinician assessed for immediate needs, medication compliance and efficacy,  and safety concerns?   Summary: 1:30-1:45pm: At check-out, patient reports no immediate  concerns. Patient demonstrates progress as evidenced by engagement and responsiveness to treatment. Patient denies SI/HI/self-harm thoughts at the end of group.   Suicidal/Homicidal: Nowithout intent/plan  Plan: ?Pt will continue in PHP and medication management while continuing to work on decreasing depression symptoms,?SI, and anxiety symptoms,?and increasing the ability to self manage symptoms.     Collaboration of Care: Medication Management AEB Todd Kerns, NP and Other Todd Macadam, RN  Patient/Guardian was advised Release of Information must be obtained prior to any record release in order to collaborate their care with an outside provider. Patient/Guardian was advised if they have not already done so to contact the registration department to sign all necessary forms in order for us  to release information regarding their care.   Consent: Patient/Guardian gives verbal consent for treatment and assignment of benefits for services provided during this visit. Patient/Guardian expressed understanding and agreed to proceed.   Diagnosis: Bipolar II disorder (HCC) [F31.81]    1. Bipolar II disorder [facility])       Todd Tucker, Todd Tucker 01/02/2024

## 2024-01-03 ENCOUNTER — Ambulatory Visit (HOSPITAL_COMMUNITY)

## 2024-01-03 ENCOUNTER — Ambulatory Visit (INDEPENDENT_AMBULATORY_CARE_PROVIDER_SITE_OTHER): Admitting: Licensed Clinical Social Worker

## 2024-01-03 DIAGNOSIS — F3181 Bipolar II disorder: Secondary | ICD-10-CM | POA: Diagnosis not present

## 2024-01-03 DIAGNOSIS — R4589 Other symptoms and signs involving emotional state: Secondary | ICD-10-CM

## 2024-01-04 ENCOUNTER — Encounter (HOSPITAL_COMMUNITY): Payer: Self-pay

## 2024-01-04 ENCOUNTER — Ambulatory Visit (HOSPITAL_COMMUNITY)

## 2024-01-04 ENCOUNTER — Ambulatory Visit (INDEPENDENT_AMBULATORY_CARE_PROVIDER_SITE_OTHER): Admitting: Licensed Clinical Social Worker

## 2024-01-04 DIAGNOSIS — F3181 Bipolar II disorder: Secondary | ICD-10-CM

## 2024-01-04 DIAGNOSIS — R4589 Other symptoms and signs involving emotional state: Secondary | ICD-10-CM

## 2024-01-04 NOTE — Therapy (Signed)
 [facility] [address] [place], [place], [address] Phone: [phone]   Fax:  [phone]  Occupational Therapy Treatment  Patient Details  Name: Todd Tucker MRN: [id number] Date of Birth: May 21, 1980 No data recorded  Encounter Date: 12/25/2023   OT End of Session - 01/04/24 2030     Visit Number 7    Number of Visits 15    Date for Recertification  12/28/23    OT Start Time 1230    OT Stop Time 1330    OT Time Calculation (min) 60 min          Past Medical History:  Diagnosis Date   Anxiety    Depression    OCD (obsessive compulsive disorder)    Sleep apnea     Past Surgical History:  Procedure Laterality Date   KNEE SURGERY Bilateral    KNEE SURGERY      There were no vitals filed for this visit.   Subjective Assessment - 01/04/24 2030     Currently in Pain? No/denies    Pain Score 0-No pain             Group Session:  S: Doing good. Sleeping better w/ the new medication.   O: The primary objective of this topic is to explore and understand the concept of occupational balance in the context of daily living. The term occupational balance is defined broadly, encompassing all activities that occupy an individual's time and energy, including self-care, leisure, and work-related tasks. The goal is to guide participants towards achieving a harmonious blend of these activities, tailored to their personal values and life circumstances. This balance is aimed at enhancing overall well-being, not by equally distributing time across activities, but by ensuring that daily engagements are fulfilling and not draining. The content delves into identifying various barriers that individuals face in achieving occupational balance, such as overcommitment, misaligned priorities, external pressures, and lack of effective time management. The impact of these barriers on occupational performance, roles, and lifestyles is examined,  highlighting issues like reduced efficiency, strained relationships, and potential health problems. Strategies for cultivating occupational balance are a key focus. These strategies include practical methods like time blocking, prioritizing tasks, establishing self-care rituals, decluttering, connecting with nature, and engaging in reflective practices. These approaches are designed to be adaptable and applicable to a wide range of life scenarios, promoting a proactive and mindful approach to daily living. The overall aim is to equip participants with the knowledge and tools to create a balanced lifestyle that supports their mental, emotional, and physical health, thereby improving their functional performance in daily life.   A:  The patient demonstrated a high level of engagement and active participation throughout the session on occupational balance. The patient frequently contributed to discussions, offering insightful reflections on personal experiences related to the barriers and strategies for achieving occupational balance. There was a clear understanding of the concept and an ability to relate it to their own life. The patient showed enthusiasm in learning and applying the strategies discussed, such as time blocking and self-care rituals, indicating a strong motivation to improve their occupational balance. The patient's proactive approach and responsiveness to the topic suggest a high potential for implementing these strategies effectively in their daily routine.    P: Continue to attend PHP OT group sessions 5x week for 3 weeks to promote daily structure, social engagement, and opportunities to develop and utilize adaptive strategies to maximize functional performance in preparation for safe transition and integration  back into school, work, and the community. Plan to address topic of tbd in next OT group session.                   OT Education - 01/04/24 2030     Education  Details Occupational Balance           OT Short Term Goals - 12/17/23 2052       OT SHORT TERM GOAL #1   Title Patient will develop and implement a structured daily routine that incorporates at least one productive, one self-care, and one leisure activity to improve consistency, balance, and sense of purpose throughout the week.    Time 4    Period Weeks    Status On-going    Target Date 12/28/23      OT SHORT TERM GOAL #2   Title Patient will identify one personal goal related to daily functioning or wellness and apply the SMART goal framework to create a clear, measurable, and achievable plan to improve engagement in meaningful roles.    Status On-going      OT SHORT TERM GOAL #3   Title Patient will demonstrate improved motivation and initiation by identifying barriers to engagement and using at least one strategy (e.g., breaking tasks into smaller steps, scheduling, or reward systems) to follow through on selected activities.    Status On-going                   Plan - 01/04/24 2031     Psychosocial Skills Interpersonal Interaction;Habits;Environmental  Adaptations;Routines and Behaviors;Coping Strategies          Patient will benefit from skilled therapeutic intervention in order to improve the following deficits and impairments:       Psychosocial Skills: Interpersonal Interaction, Habits, Environmental  Adaptations, Routines and Behaviors, Coping Strategies   Visit Diagnosis: Difficulty coping    Problem List Patient Active Problem List   Diagnosis Date Noted   MDD (major depressive disorder), recurrent severe, 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 [place], [place], [address] Phone: [phone]   Fax:  [phone]  Name: Atiba Kimberlin MRN: [id number] Date of Birth: January 27, 1981

## 2024-01-05 ENCOUNTER — Ambulatory Visit (INDEPENDENT_AMBULATORY_CARE_PROVIDER_SITE_OTHER): Admitting: Licensed Clinical Social Worker

## 2024-01-05 ENCOUNTER — Other Ambulatory Visit (HOSPITAL_BASED_OUTPATIENT_CLINIC_OR_DEPARTMENT_OTHER): Payer: Self-pay

## 2024-01-05 ENCOUNTER — Other Ambulatory Visit: Payer: Self-pay

## 2024-01-05 ENCOUNTER — Ambulatory Visit (HOSPITAL_COMMUNITY)

## 2024-01-05 DIAGNOSIS — F3181 Bipolar II disorder: Secondary | ICD-10-CM

## 2024-01-05 DIAGNOSIS — F422 Mixed obsessional thoughts and acts: Secondary | ICD-10-CM

## 2024-01-05 DIAGNOSIS — R4589 Other symptoms and signs involving emotional state: Secondary | ICD-10-CM

## 2024-01-05 MED ORDER — DOXEPIN HCL 50 MG PO CAPS
50.0000 mg | ORAL_CAPSULE | Freq: Every day | ORAL | 0 refills | Status: DC
  Filled 2024-01-05: qty 30, 30d supply, fill #0

## 2024-01-05 MED ORDER — HYDROXYZINE HCL 25 MG PO TABS
25.0000 mg | ORAL_TABLET | Freq: Three times a day (TID) | ORAL | 0 refills | Status: DC | PRN
  Filled 2024-01-05: qty 45, 15d supply, fill #0

## 2024-01-05 MED ORDER — VENLAFAXINE HCL ER 150 MG PO CP24
150.0000 mg | ORAL_CAPSULE | Freq: Every day | ORAL | 0 refills | Status: DC
  Filled 2024-01-05: qty 30, 30d supply, fill #0

## 2024-01-05 MED ORDER — LAMOTRIGINE 200 MG PO TABS
200.0000 mg | ORAL_TABLET | Freq: Every day | ORAL | 0 refills | Status: DC
Start: 2024-01-05 — End: 2024-01-16
  Filled 2024-01-05: qty 30, 30d supply, fill #0

## 2024-01-05 NOTE — Progress Notes (Signed)
  [facility] Partial Outpatient Program- [facility] Discharge Summary  Todd Tucker [id number]  Admission date: 12/13/2023 Discharge date: 01/05/2024  Reason for admission:  Todd Tucker 43 year old male recently discharged from inpatient admission due to passive suicidal ideations, worsening depression and anxiety symptoms.  He reports during inpatient admission he was initiated on Effexor and he was advised to discontinue Zoloft  and continue Lamictal  200 mg daily.  Reports he has been taking and tolerating medications well.  He no concerns related to suicidal or homicidal ideations.  Denies auditory visual hallucinations.  Reports a good appetite.  States he is resting well throughout the night.  Patient to start partial outpatient hospitalization programming  (PHP)on 12/13/2023   Progress in Program Toward Treatment Goals: Progressing patient attended and participated with daily group session with active and engaged participation.  Denying suicidal or homicidal ideations at discharge.  No concerns related to substance abuse.  Discussed following up with individual therapy and [facility] throughput to establish care for medication management  Progress (rationale): Follow-up with throughput [facility]: Medication Management AEB will make medications available x 30 days with 1 refill  Patient/Guardian was advised Release of Information must be obtained prior to any record release in order to collaborate their care with an outside provider. Patient/Guardian was advised if they have not already done so to contact the registration department to sign all necessary forms in order for us  to release information regarding their care.   Consent: Patient/Guardian gives verbal consent for treatment and assignment of benefits for services provided during this visit. Patient/Guardian expressed understanding and agreed to proceed.    [name]   NP 01/05/2024

## 2024-01-08 ENCOUNTER — Encounter (HOSPITAL_COMMUNITY): Payer: Self-pay

## 2024-01-08 ENCOUNTER — Other Ambulatory Visit (HOSPITAL_BASED_OUTPATIENT_CLINIC_OR_DEPARTMENT_OTHER): Payer: Self-pay

## 2024-01-08 ENCOUNTER — Ambulatory Visit (HOSPITAL_COMMUNITY)

## 2024-01-08 NOTE — Therapy (Signed)
 [facility] [address] [place], [place], [address] Phone: [phone]   Fax:  [phone]  Occupational Therapy Treatment  Patient Details  Name: Todd Tucker MRN: [id number] Date of Birth: 31-Mar-1980 No data recorded  Encounter Date: 12/29/2023   OT End of Session - 01/08/24 2221     Visit Number 10    Number of Visits 15    Date for Recertification  12/28/23    OT Start Time 1230    OT Stop Time 1330    OT Time Calculation (min) 60 min          Past Medical History:  Diagnosis Date   Anxiety    Depression    OCD (obsessive compulsive disorder)    Sleep apnea     Past Surgical History:  Procedure Laterality Date   KNEE SURGERY Bilateral    KNEE SURGERY      There were no vitals filed for this visit.   Subjective Assessment - 01/08/24 2220     Currently in Pain? No/denies    Pain Score 0-No pain               Group Session:  S: Doing good. Things are better in all areas I pleased to say.   O: The primary objective of this topic is to explore and understand the concept of occupational balance in the context of daily living. The term occupational balance is defined broadly, encompassing all activities that occupy an individual's time and energy, including self-care, leisure, and work-related tasks. The goal is to guide participants towards achieving a harmonious blend of these activities, tailored to their personal values and life circumstances. This balance is aimed at enhancing overall well-being, not by equally distributing time across activities, but by ensuring that daily engagements are fulfilling and not draining. The content delves into identifying various barriers that individuals face in achieving occupational balance, such as overcommitment, misaligned priorities, external pressures, and lack of effective time management. The impact of these barriers on occupational performance, roles, and lifestyles is  examined, highlighting issues like reduced efficiency, strained relationships, and potential health problems. Strategies for cultivating occupational balance are a key focus. These strategies include practical methods like time blocking, prioritizing tasks, establishing self-care rituals, decluttering, connecting with nature, and engaging in reflective practices. These approaches are designed to be adaptable and applicable to a wide range of life scenarios, promoting a proactive and mindful approach to daily living. The overall aim is to equip participants with the knowledge and tools to create a balanced lifestyle that supports their mental, emotional, and physical health, thereby improving their functional performance in daily life.   A:  The patient demonstrated a high level of engagement and active participation throughout the session on occupational balance. The patient frequently contributed to discussions, offering insightful reflections on personal experiences related to the barriers and strategies for achieving occupational balance. There was a clear understanding of the concept and an ability to relate it to their own life. The patient showed enthusiasm in learning and applying the strategies discussed, such as time blocking and self-care rituals, indicating a strong motivation to improve their occupational balance. The patient's proactive approach and responsiveness to the topic suggest a high potential for implementing these strategies effectively in their daily routine.   P: Continue to attend PHP OT group sessions 5x week for 4 weeks to promote daily structure, social engagement, and opportunities to develop and utilize adaptive strategies to maximize functional performance in preparation  for safe transition and integration back into school, work, and the community. Plan to address topic of tbd in next OT group session.                 OT Education - 01/08/24 2221      Education Details Occupational Balance           OT Short Term Goals - 12/17/23 2052       OT SHORT TERM GOAL #1   Title Patient will develop and implement a structured daily routine that incorporates at least one productive, one self-care, and one leisure activity to improve consistency, balance, and sense of purpose throughout the week.    Time 4    Period Weeks    Status On-going    Target Date 12/28/23      OT SHORT TERM GOAL #2   Title Patient will identify one personal goal related to daily functioning or wellness and apply the SMART goal framework to create a clear, measurable, and achievable plan to improve engagement in meaningful roles.    Status On-going      OT SHORT TERM GOAL #3   Title Patient will demonstrate improved motivation and initiation by identifying barriers to engagement and using at least one strategy (e.g., breaking tasks into smaller steps, scheduling, or reward systems) to follow through on selected activities.    Status On-going                   Plan - 01/08/24 2221     Psychosocial Skills Interpersonal Interaction;Habits;Environmental  Adaptations;Routines and Behaviors;Coping Strategies          Patient will benefit from skilled therapeutic intervention in order to improve the following deficits and impairments:       Psychosocial Skills: Interpersonal Interaction, Habits, Environmental  Adaptations, Routines and Behaviors, Coping Strategies   Visit Diagnosis: Difficulty coping    Problem List Patient Active Problem List   Diagnosis Date Noted   MDD (major depressive disorder), recurrent severe, 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   Major depressive disorder, recurrent episode, severe (HCC) 12/05/2023   Generalized anxiety disorder 12/05/2023   Cluster C personality disorder (HCC) 12/05/2023   B12 deficiency 12/05/2023   Excoriation (skin-picking) disorder 12/05/2023   Major depressive disorder, recurrent episode,  moderate (HCC) 12/02/2023   Posttraumatic stress disorder 11/04/2017   Bipolar II disorder (HCC) 11/04/2017    [name], OT 01/08/2024, 10:21 PM  D[place] Purpura, OT   [place] [facility] [address] [place], [place], [address] Phone: [phone]   Fax:  [phone]  Name: Todd Tucker MRN: [id number] Date of Birth: [birth year]

## 2024-01-08 NOTE — Therapy (Signed)
 [facility] [address] [place], [place], [address] Phone: [phone]   Fax:  [phone]  Occupational Therapy Treatment  Patient Details  Name: Todd Tucker MRN: [id number] Date of Birth: 1980-12-29 No data recorded  Encounter Date: 12/26/2023   OT End of Session - 01/08/24 2135     Visit Number 8    Number of Visits 15    Date for Recertification  12/28/23    OT Start Time 1230    OT Stop Time 1330    OT Time Calculation (min) 60 min          Past Medical History:  Diagnosis Date   Anxiety    Depression    OCD (obsessive compulsive disorder)    Sleep apnea     Past Surgical History:  Procedure Laterality Date   KNEE SURGERY Bilateral    KNEE SURGERY      There were no vitals filed for this visit.   Subjective Assessment - 01/08/24 2135     Currently in Pain? No/denies    Pain Score 0-No pain             Group Session:  S: Doing good man, getting better sleep with the new medication adjustment   O: The primary objective of this topic is to explore and understand the concept of occupational balance in the context of daily living. The term occupational balance is defined broadly, encompassing all activities that occupy an individual's time and energy, including self-care, leisure, and work-related tasks. The goal is to guide participants towards achieving a harmonious blend of these activities, tailored to their personal values and life circumstances. This balance is aimed at enhancing overall well-being, not by equally distributing time across activities, but by ensuring that daily engagements are fulfilling and not draining. The content delves into identifying various barriers that individuals face in achieving occupational balance, such as overcommitment, misaligned priorities, external pressures, and lack of effective time management. The impact of these barriers on occupational performance, roles, and lifestyles is  examined, highlighting issues like reduced efficiency, strained relationships, and potential health problems. Strategies for cultivating occupational balance are a key focus. These strategies include practical methods like time blocking, prioritizing tasks, establishing self-care rituals, decluttering, connecting with nature, and engaging in reflective practices. These approaches are designed to be adaptable and applicable to a wide range of life scenarios, promoting a proactive and mindful approach to daily living. The overall aim is to equip participants with the knowledge and tools to create a balanced lifestyle that supports their mental, emotional, and physical health, thereby improving their functional performance in daily life.   A:  The patient demonstrated a high level of engagement and active participation throughout the session on occupational balance. The patient frequently contributed to discussions, offering insightful reflections on personal experiences related to the barriers and strategies for achieving occupational balance. There was a clear understanding of the concept and an ability to relate it to their own life. The patient showed enthusiasm in learning and applying the strategies discussed, such as time blocking and self-care rituals, indicating a strong motivation to improve their occupational balance. The patient's proactive approach and responsiveness to the topic suggest a high potential for implementing these strategies effectively in their daily routine.   P: Continue to attend PHP OT group sessions 5x week for 3 weeks to promote daily structure, social engagement, and opportunities to develop and utilize adaptive strategies to maximize functional performance in preparation for safe transition  and integration back into school, work, and the community. Plan to address topic of tbd in next OT group session.                   OT Education - 01/08/24 2135      Education Details Occupational Balance           OT Short Term Goals - 12/17/23 2052       OT SHORT TERM GOAL #1   Title Patient will develop and implement a structured daily routine that incorporates at least one productive, one self-care, and one leisure activity to improve consistency, balance, and sense of purpose throughout the week.    Time 4    Period Weeks    Status On-going    Target Date 12/28/23      OT SHORT TERM GOAL #2   Title Patient will identify one personal goal related to daily functioning or wellness and apply the SMART goal framework to create a clear, measurable, and achievable plan to improve engagement in meaningful roles.    Status On-going      OT SHORT TERM GOAL #3   Title Patient will demonstrate improved motivation and initiation by identifying barriers to engagement and using at least one strategy (e.g., breaking tasks into smaller steps, scheduling, or reward systems) to follow through on selected activities.    Status On-going                   Plan - 01/08/24 2135     Psychosocial Skills Interpersonal Interaction;Habits;Environmental  Adaptations;Routines and Behaviors;Coping Strategies          Patient will benefit from skilled therapeutic intervention in order to improve the following deficits and impairments:       Psychosocial Skills: Interpersonal Interaction, Habits, Environmental  Adaptations, Routines and Behaviors, Coping Strategies   Visit Diagnosis: Difficulty coping    Problem List Patient Active Problem List   Diagnosis Date Noted   MDD (major depressive disorder), recurrent severe, 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   Major depressive disorder, recurrent episode, severe (HCC) 12/05/2023   Generalized anxiety disorder 12/05/2023   Cluster C personality disorder (HCC) 12/05/2023   B12 deficiency 12/05/2023   Excoriation (skin-picking) disorder 12/05/2023   Major depressive disorder, recurrent episode,  moderate (HCC) 12/02/2023   Posttraumatic stress disorder 11/04/2017   Bipolar II disorder (HCC) 11/04/2017    [name], OT 01/08/2024, 9:36 PM  Dallas Purpura, OT   [place] [facility] [address] [place], [place], [address] Phone: [phone]   Fax:  [phone]  Name: Todd Tucker MRN: [id number] Date of Birth: 25-Apr-1980

## 2024-01-08 NOTE — Therapy (Signed)
 [place] [facility] [address] [place], [place], [address] Phone: [phone]   Fax:  [phone]  Occupational Therapy Treatment  Patient Details  Name: Todd Tucker MRN: [id number] Date of Birth: 1980/05/04 No data recorded  Encounter Date: 12/28/2023   OT End of Session - 01/08/24 2201     Visit Number 9    Number of Visits 15    Date for Recertification  12/28/23    OT Start Time 1230    OT Stop Time 1330    OT Time Calculation (min) 60 min          Past Medical History:  Diagnosis Date   Anxiety    Depression    OCD (obsessive compulsive disorder)    Sleep apnea     Past Surgical History:  Procedure Laterality Date   KNEE SURGERY Bilateral    KNEE SURGERY      There were no vitals filed for this visit.   Subjective Assessment - 01/08/24 2201     Currently in Pain? No/denies    Pain Score 0-No pain              Group Session:  S: Doing good today. Sleep is really improving to the best it has been in years.  O: The primary objective of this topic is to explore and understand the concept of occupational balance in the context of daily living. The term occupational balance is defined broadly, encompassing all activities that occupy an individual's time and energy, including self-care, leisure, and work-related tasks. The goal is to guide participants towards achieving a harmonious blend of these activities, tailored to their personal values and life circumstances. This balance is aimed at enhancing overall well-being, not by equally distributing time across activities, but by ensuring that daily engagements are fulfilling and not draining. The content delves into identifying various barriers that individuals face in achieving occupational balance, such as overcommitment, misaligned priorities, external pressures, and lack of effective time management. The impact of these barriers on occupational performance, roles, and  lifestyles is examined, highlighting issues like reduced efficiency, strained relationships, and potential health problems. Strategies for cultivating occupational balance are a key focus. These strategies include practical methods like time blocking, prioritizing tasks, establishing self-care rituals, decluttering, connecting with nature, and engaging in reflective practices. These approaches are designed to be adaptable and applicable to a wide range of life scenarios, promoting a proactive and mindful approach to daily living. The overall aim is to equip participants with the knowledge and tools to create a balanced lifestyle that supports their mental, emotional, and physical health, thereby improving their functional performance in daily life.   A:  The patient demonstrated a high level of engagement and active participation throughout the session on occupational balance. The patient frequently contributed to discussions, offering insightful reflections on personal experiences related to the barriers and strategies for achieving occupational balance. There was a clear understanding of the concept and an ability to relate it to their own life. The patient showed enthusiasm in learning and applying the strategies discussed, such as time blocking and self-care rituals, indicating a strong motivation to improve their occupational balance. The patient's proactive approach and responsiveness to the topic suggest a high potential for implementing these strategies effectively in their daily routine.   P: Continue to attend PHP OT group sessions 5x week for 4 weeks to promote daily structure, social engagement, and opportunities to develop and utilize adaptive strategies to maximize functional performance in  preparation for safe transition and integration back into school, work, and the community. Plan to address topic of tbd in next OT group session.                  OT Education - 01/08/24  2201     Education Details Occupational Balance           OT Short Term Goals - 12/17/23 2052       OT SHORT TERM GOAL #1   Title Patient will develop and implement a structured daily routine that incorporates at least one productive, one self-care, and one leisure activity to improve consistency, balance, and sense of purpose throughout the week.    Time 4    Period Weeks    Status On-going    Target Date 12/28/23      OT SHORT TERM GOAL #2   Title Patient will identify one personal goal related to daily functioning or wellness and apply the SMART goal framework to create a clear, measurable, and achievable plan to improve engagement in meaningful roles.    Status On-going      OT SHORT TERM GOAL #3   Title Patient will demonstrate improved motivation and initiation by identifying barriers to engagement and using at least one strategy (e.g., breaking tasks into smaller steps, scheduling, or reward systems) to follow through on selected activities.    Status On-going                   Plan - 01/08/24 2201     Psychosocial Skills Interpersonal Interaction;Habits;Environmental  Adaptations;Routines and Behaviors;Coping Strategies          Patient will benefit from skilled therapeutic intervention in order to improve the following deficits and impairments:       Psychosocial Skills: Interpersonal Interaction, Habits, Environmental  Adaptations, Routines and Behaviors, Coping Strategies   Visit Diagnosis: Difficulty coping    Problem List Patient Active Problem List   Diagnosis Date Noted   MDD (major depressive disorder), recurrent severe, 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   Major depressive disorder, recurrent episode, severe (HCC) 12/05/2023   Generalized anxiety disorder 12/05/2023   Cluster C personality disorder (HCC) 12/05/2023   B12 deficiency 12/05/2023   Excoriation (skin-picking) disorder 12/05/2023   Major depressive disorder, recurrent  episode, moderate (HCC) 12/02/2023   Posttraumatic stress disorder 11/04/2017   Bipolar II disorder (HCC) 11/04/2017    [name], OT 01/08/2024, 10:01 PM  Dallas Purpura, OT   [place] [facility] [address] [place], [place], [address] Phone: [phone]   Fax:  [phone]  Name: Todd Tucker MRN: [id number] Date of Birth: [birth year]

## 2024-01-09 ENCOUNTER — Ambulatory Visit (HOSPITAL_COMMUNITY)

## 2024-01-10 ENCOUNTER — Ambulatory Visit (HOSPITAL_COMMUNITY)

## 2024-01-10 ENCOUNTER — Other Ambulatory Visit (HOSPITAL_BASED_OUTPATIENT_CLINIC_OR_DEPARTMENT_OTHER): Payer: Self-pay

## 2024-01-11 ENCOUNTER — Ambulatory Visit (HOSPITAL_COMMUNITY): Admitting: Mental Health

## 2024-01-11 ENCOUNTER — Ambulatory Visit (HOSPITAL_COMMUNITY)

## 2024-01-11 DIAGNOSIS — F3181 Bipolar II disorder: Secondary | ICD-10-CM | POA: Diagnosis not present
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
## 2024-01-12 ENCOUNTER — Ambulatory Visit (HOSPITAL_COMMUNITY)

## 2024-01-12 NOTE — Progress Notes (Unsigned)
 Psychiatric Initial Adult Assessment   Patient Identification: Todd Tucker MRN:  [id number] Date of Evaluation:  01/16/2024 Referral Source: PHP Chief Complaint:   Chief Complaint  Patient presents with   Establish Care   Depression   Anxiety   Visit Diagnosis:    ICD-10-CM   1. Major depressive disorder, recurrent episode, moderate (HCC)  F33.1 doxepin  (SINEQUAN ) 50 MG capsule    venlafaxine  XR (EFFEXOR -XR) 150 MG 24 hr capsule    2. Bipolar II disorder (HCC)  F31.81 lamoTRIgine  (LAMICTAL ) 200 MG tablet    3. Generalized anxiety disorder  F41.1 hydrOXYzine  (ATARAX ) 25 MG tablet    4. Encounter to establish care  Z76.89 Ambulatory referral to Internal Medicine       Assessment:  Todd Tucker is a 43 y.o. adult with a history of bipolar 2 disorder, PTSD, MDD, GAD, cluster C personality traits who presents in person to [facility] at [facility] for initial evaluation on 01/16/2024.    At initial evaluation patient reports feeling depressed with hopelessness, depressed mood, feeling isolated due to the psychosocial stressors that include currently being unemployed, living with his parents.  His anxiety has been better on the current dose of medications and he has not needed hydroxyzine  over the past 1 week.  He has had no panic attacks, is not actively or passively homicidal or suicidal, has been eating and sleeping well.  He has tried to engage in going to the gym every day and has been keeping himself busy by enrolling in college for clinical social work.  He does not use any substances including alcohol or cigarettes.  He also reports hypomanic that lasted few hours associated with depressive episodes that are more frequent, impairing daily functioning.  He does have limited coping skills and tends to withdraw socially during depression, will definitely benefit from having a strong social environment.  Currently living with parents does act like a support system but  involvement with groups, therapy would be beneficial for them.  He does not have any family history of psychiatric conditions.  From past behaviors of skin picking, nailbiting, strong suspicion of anxiety, unmanaged for past several years.  Would likely consider memantine if the behaviors continue.  Since the patient has had significant benefit on the current dose of medications, plan to continue same management until his next appointment.  Will have him back in the clinic in 6 weeks  A number of assessments were performed during the evaluation today including  PHQ-9 which they scored a 19 on, GAD-7 which they scored a 18 on, and [place] suicide severity screening which showed low risk.    Risk Assessment: A suicide and violence risk assessment was performed as part of this evaluation. There patient is deemed to be at chronic elevated risk for self-harm/suicide given the following factors: sense of isolation. These risk factors are mitigated by the following factors: lack of active SI/HI, no known access to weapons or firearms, no history of previous suicide attempts, no history of violence, and motivation for treatment. The patient is deemed to be at chronic elevated risk for violence given the following factors: N/A. These risk factors are mitigated by the following factors: no known history of violence towards others, no known violence towards others in the last 6 months, no known history of threats of harm towards others, no known homicidal ideation in the last 6 months, no command hallucinations to harm others in the last 6 months, no active symptoms of psychosis, and no  active symptoms of mania. There is no acute risk for suicide or violence at this time. The patient was educated about relevant modifiable risk factors including following recommendations for treatment of psychiatric illness and abstaining from substance abuse.  While future psychiatric events cannot be accurately predicted, the patient  does not currently require  acute inpatient psychiatric care and does not currently meet [place]  involuntary commitment criteria.  Patient was given contact information for crisis resources, behavioral health clinic and was instructed to call 911 for emergencies.    Plan: # MDD without psychotic features, moderate # Bipolar 2 disorder Past medication trials: Zoloft  Status of problem: New to the writer Interventions: -- Continue Effexor  150 mg in the morning with breakfast -- Continue doxepin  50 mg nightly for sleep -- Continue Lamictal  200 mg daily for mood stabilization  # GAD Past medication trials: Zoloft  Status of problem: New to the writer Interventions: -- Continue hydroxyzine  25 mg 3 times daily as needed for heightened anxiety -- Encouraged healthy coping skills -- Continue therapy in the clinic  # Excoriation disorder Past medication trials:  Status of problem: Current/new to the writer Interventions: -- Continue therapy in the clinic -- May consider memantine in the future   History of Present Illness:   Todd Tucker is a 43 year old male patient with a history of bipolar 2 disorder, PTSD, MDD, GAD, skin picking disorder that presented to the clinic today to establish care.  He was hospitalized in October 2025 for bipolar disorder, suicidal ideation.  Was at [facility], got discharged and was recommended to follow-up as outpatient for psychiatry. When asked about the reason he stated that I need to have an appointment as a part of treatment, stated that I was not doing well until I was at the [facility].  Reported that he was having suicidal ideations in October 2025, was hospitalized by his parents and was treated.  He denied any suicide attempts in the past, reported that he had a plan earlier this year but did not act, reported self-injurious behavior by cutting on his thighs since he was seen, recently stopped in July 2025.  Reported that cutting makes me feel . He reported  his mood as great during PHP, now a little off .  He denied any active or passive SI/HI/AVH currently.  Reported that he has been in touch with [facility] to attend groups.  When asked about depression he reported hopelessness, feeling sad, staying quiet and isolated , reported that he has been going to gym 7 days a week.  He denied any changes with his energy, reported adequate appetite and good sleep with doxepin .  He denied any nightmares or flashbacks or any trauma growing up.  He denied any symptoms of mania or psychosis.  Reported that he has been living with his parents currently, is unemployed but has been enrolled in college for clinical social work.  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 When asked about bipolar disorder he stated that he has highs , that lasts for a few hours excitement, I can conquer the world, spending a lot of money , states that after the few hours he has guilt, about his behaviors.  He reported that low-dose last for several weeks. When asked about anxiety he stated it could be a lot worse , he denied any panic attacks  and reported that he has not been taking hydroxyzine , last dose was last week. Reported that he was previously on Zoloft , Xanax , his first psychiatric visit was in 2012 in [place]  for similar concerns.  Also reported that he had done therapy in the past for 2 years and has recently started therapy in the clinic.  Encouraged him to keep up with the therapy appointments. He reported significant benefit on his medications, plan to continue the same medication management.  Discussed the risk benefits and side effects of all his medications.  Plan to have him back in the clinic in 6 weeks.  Associated Signs/Symptoms: Depression Symptoms:  depressed mood, anhedonia, fatigue, feelings of  worthlessness/guilt, hopelessness, (Hypo) Manic Symptoms:  None Anxiety Symptoms:  Excessive Worry, Psychotic Symptoms:  None PTSD Symptoms: Negative  Past Psychiatric History:  Past psychiatric diagnoses: Bipolar 2 disorder, PTSD, MDD, GAD, cluster C personality Psychiatric hospitalizations: Once in October 2025 for bipolar disorder Past suicide attempts: Denies Hx of self harm: Denies Hx of violence towards others: Denies Prior psychiatric providers: None Prior therapy: None Access to firearms: Denies  Prior medication trials: Lamotrigine , Effexor , hydroxyzine , doxepin   Substance use: Denies  Past Medical History:  Past Medical History:  Diagnosis Date   Anxiety    Depression    OCD (obsessive compulsive disorder)    Sleep apnea     Past Surgical History:  Procedure Laterality Date   KNEE SURGERY Bilateral    KNEE SURGERY      Family Psychiatric History: NS  Family History:  Family History  Problem Relation Age of Onset   Anxiety disorder Mother    CVA Father    ADD / ADHD Sister     Social History:   Social History   Socioeconomic History   Marital status: Legally Separated    Spouse name: Not on file   Number of children: 0   Years of education: Not on file   Highest education level: Master's degree (e.g., MA, MS, MEng, MEd, MSW, MBA)  Occupational History   Not on file  Tobacco Use   Smoking status: Never   Smokeless tobacco: Never  Vaping Use   Vaping status: Never Used  Substance and Sexual Activity   Alcohol use: Yes    Comment: rarely   Drug use: No   Sexual activity: Not on file  Other Topics Concern   Not on file  Social History Narrative   Not on file   Social Drivers of Health   Financial Resource Strain: Low Risk  (01/11/2024)   Overall Financial Resource Strain (CARDIA)    Difficulty of Paying Living Expenses: Not very hard  Food Insecurity: No Food Insecurity (12/04/2023)   Hunger Vital Sign    Worried About Running Out of  Food in the Last Year: Never true    Ran Out of Food in the Last Year: Never true  Transportation Needs: No Transportation Needs (12/04/2023)   PRAPARE - Administrator, Civil Service (Medical): No    Lack of Transportation (Non-Medical): No  Physical Activity: Sufficiently Active (01/11/2024)   Exercise Vital Sign    Days of Exercise per Week: 7 days    Minutes of Exercise per Session: 90 min  Stress: Stress Concern Present (01/11/2024)   [employer] of Occupational Health - Occupational Stress Questionnaire    Feeling of Stress: Very much  Social Connections: Moderately Isolated (01/11/2024)   Social Connection and Isolation Panel    Frequency of Communication with Friends  and Family: More than three times a week    Frequency of Social Gatherings with Friends and Family: Never    Attends Religious Services: More than 4 times per year    Active Member of [employer] or Organizations: No    Attends Banker Meetings: Never    Marital Status: Separated    Additional Social History: Patient used to work as a psychologist, occupational and a school, currently unemployed.  He is originally from [place] , lived in [place]  before moving to [place] .  Allergies:  No Known Allergies  Metabolic Disorder Labs: Lab Results  Component Value Date   HGBA1C 4.5 (L) 12/04/2023   MPG 82.45 12/04/2023   Lab Results  Component Value Date   PROLACTIN 8.9 12/04/2023   Lab Results  Component Value Date   CHOL 178 12/04/2023   TRIG 169 (H) 12/04/2023   HDL 33 (L) 12/04/2023   CHOLHDL 5.4 12/04/2023   VLDL 34 12/04/2023   LDLCALC 111 (H) 12/04/2023   Lab Results  Component Value Date   TSH 1.865 12/04/2023    Therapeutic Level Labs: No results found for: LITHIUM No results found for: CBMZ No results found for: VALPROATE  Current Medications: Current Outpatient Medications  Medication Sig Dispense Refill   cyanocobalamin  1000 MCG tablet Take 1 tablet (1,000 mcg  total) by mouth daily. 100 tablet 0   doxepin  (SINEQUAN ) 50 MG capsule Take 1 capsule (50 mg total) by mouth at bedtime. 90 capsule 0   hydrOXYzine  (ATARAX ) 25 MG tablet Take 1 tablet (25 mg total) by mouth 3 (three) times daily as needed for anxiety and for sleep. 45 tablet 2   lamoTRIgine  (LAMICTAL ) 200 MG tablet Take 1 tablet (200 mg total) by mouth daily. 90 tablet 0   venlafaxine  XR (EFFEXOR -XR) 150 MG 24 hr capsule Take 1 capsule (150 mg total) by mouth daily with breakfast. 90 capsule 0   No current facility-administered medications for this visit.    Musculoskeletal: Strength & Muscle Tone: within normal limits Gait & Station: normal Patient leans: N/A  Psychiatric Specialty Exam:  Psychiatric Specialty Exam: Blood pressure (!) 162/95, pulse (!) 114, height 5' 7 (1.702 m), weight 255 lb (115.7 kg).Body mass index is 39.94 kg/m. Review of Systems  General Appearance: Casual and Fairly Groomed  Eye Contact:  Good  Speech:  Clear and Coherent and Normal Rate  Volume:  Normal  Mood:  Euthymic  Affect:  Appropriate  Thought Content: Logical   Suicidal Thoughts:  No  Homicidal Thoughts:  No  Thought Process:  Coherent  Orientation:  Full (Time, Place, and Person)    Memory: Immediate;   Good Recent;   Good Remote;   Good  Judgment:  Fair  Insight:  Fair  Concentration:  Concentration: Good and Attention Span: Good  Recall:  not formally assessed   Fund of Knowledge: Good  Language: Good  Psychomotor Activity:  Normal  Akathisia:  No  AIMS (if indicated): not done  Assets:  Communication Skills Desire for Improvement Leisure Time Physical Health Transportation  ADL's:  Intact  Cognition: WNL  Sleep:  Fair    Screenings: AUDIT    Flowsheet Row [facility]  Alcohol Use Disorder Identification Test Final Score (AUDIT) 0   GAD-7    Flowsheet Row Office Visit from 01/16/2024 in  [facility] Counselor from 01/11/2024 in [facility] Counselor from 01/01/2024 in [facility] Counselor from 12/25/2023 in [facility] Counselor from 12/18/2023 in [facility]  Total GAD-7 Score 18 15 10 16 21    PHQ2-9    Flowsheet Row Office Visit from 01/16/2024 in [facility] Counselor from 01/11/2024 in [facility]or from 01/01/2024 in [facility] Counselor from 12/25/2023 in [facility] Counselor from 12/19/2023 in [facility]  PHQ-2 Total Score 5 4 2 3 4   PHQ-9 Total Score 19 16 11 15 18    Flowsheet Row Office Visit from 01/16/2024 in [facility] Counselor from 01/11/2024 in [facility] Counselor from 12/19/2023 in [facility]  C-SSRS RISK CATEGORY Error: Q3, 4, or 5 should not be populated when Q2 is No No Risk Error: Q3, 4, or 5 should not be populated when Q2 is No     Collaboration of Care: Medication Management AEB Dr. [facility], PHP notes, ED notes  Patient/Guardian was advised Release of Information must be obtained prior to any record release in order to collaborate their care with an outside provider. Patient/Guardian was advised if they have not already done so to contact the registration department to sign all necessary forms in order for us  to release information regarding their care.   Consent: Patient/Guardian gives verbal consent for treatment and assignment of benefits for services provided during this visit. Patient/Guardian expressed understanding and agreed to proceed.   Donita Newland, MD 11/25/202510:21 AM

## 2024-01-15 ENCOUNTER — Encounter (HOSPITAL_COMMUNITY): Payer: Self-pay

## 2024-01-15 ENCOUNTER — Ambulatory Visit (HOSPITAL_COMMUNITY)

## 2024-01-15 NOTE — Therapy (Signed)
 [facility] [address] [place], [place], [address] Phone: [phone]   Fax:  [phone]  Occupational Therapy Treatment  Patient Details  Name: Todd Tucker MRN: [id number] Date of Birth: 1980/08/13 No data recorded  Encounter Date: 01/03/2024   OT End of Session - 01/15/24 2216     Visit Number 13    Number of Visits 15    Date for Recertification  12/28/23    OT Start Time 1230    OT Stop Time 1330    OT Time Calculation (min) 60 min          Past Medical History:  Diagnosis Date   Anxiety    Depression    OCD (obsessive compulsive disorder)    Sleep apnea     Past Surgical History:  Procedure Laterality Date   KNEE SURGERY Bilateral    KNEE SURGERY      There were no vitals filed for this visit.   Subjective Assessment - 01/15/24 2215     Currently in Pain? No/denies    Pain Score 0-No pain                Group Session:  S: Doing better today. Sleep continues to improve.   O: During today's OT group session, the patient participated in an educational segment about the importance of goal-setting and the application of the SMART framework to enhance daily life, particularly focusing on ADLs and iADLs. The session began with five open-ended pre-session questions that facilitated group discussion and introspection about their current relationship with goals. Following the introduction and educational segment, participants engaged in brainstorming and group discussions to devise hypothetical SMART goals. The session concluded with five post-session questions to reinforce understanding and facilitate reflection. Throughout the session, there was a range of engagement levels noted among the participants.   A:  Patient demonstrated a high level of engagement throughout the session. They actively participated in discussions, sharing personal experiences related to goal setting and challenges faced. Patient was able to  clearly articulate an understanding of the SMART framework and proposed personal SMART goals related to their own ADLs with minimal assistance. They expressed enthusiasm about applying what they learned to their daily routine and appeared motivated to make changes.    P: Continue to attend PHP OT group sessions 5x week for 4 weeks to promote daily structure, social engagement, and opportunities to develop and utilize adaptive strategies to maximize functional performance in preparation for safe transition and integration back into school, work, and the community. Plan to address topic of tbd in next OT group session.                OT Education - 01/15/24 2216     Education Details SMART Goals    Person(s) Educated Patient    Comprehension Verbalized understanding           OT Short Term Goals - 12/17/23 2052       OT SHORT TERM GOAL #1   Title Patient will develop and implement a structured daily routine that incorporates at least one productive, one self-care, and one leisure activity to improve consistency, balance, and sense of purpose throughout the week.    Time 4    Period Weeks    Status On-going    Target Date 12/28/23      OT SHORT TERM GOAL #2   Title Patient will identify one personal goal related to daily functioning or wellness and apply the  SMART goal framework to create a clear, measurable, and achievable plan to improve engagement in meaningful roles.    Status On-going      OT SHORT TERM GOAL #3   Title Patient will demonstrate improved motivation and initiation by identifying barriers to engagement and using at least one strategy (e.g., breaking tasks into smaller steps, scheduling, or reward systems) to follow through on selected activities.    Status On-going                   Plan - 01/15/24 2216     Psychosocial Skills Interpersonal Interaction;Habits;Environmental  Adaptations;Routines and Behaviors;Coping Strategies           Patient will benefit from skilled therapeutic intervention in order to improve the following deficits and impairments:       Psychosocial Skills: Interpersonal Interaction, Habits, Environmental  Adaptations, Routines and Behaviors, Coping Strategies   Visit Diagnosis: Difficulty coping    Problem List Patient Active Problem List   Diagnosis Date Noted   MDD (major depressive disorder), recurrent severe, 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   Major depressive disorder, recurrent episode, severe (HCC) 12/05/2023   Generalized anxiety disorder 12/05/2023   Cluster C personality disorder (HCC) 12/05/2023   B12 deficiency 12/05/2023   Excoriation (skin-picking) disorder 12/05/2023   Major depressive disorder, recurrent episode, moderate (HCC) 12/02/2023   Posttraumatic stress disorder 11/04/2017   Bipolar II disorder (HCC) 11/04/2017    [name], OT 01/15/2024, 10:16 PM  [place] Purpura, OT   [facility] [address] [place], [place], [address] Phone: [phone]   Fax:  [phone]  Name: Kamarii Carton MRN: [id number] Date of Birth: [birth year]

## 2024-01-15 NOTE — Therapy (Signed)
 [facility] [address] [place], [place], [address] Phone: [phone]   Fax:  [phone]  Occupational Therapy Treatment  Patient Details  Name: Todd Tucker MRN: [id number] Date of Birth: 1980/09/22 No data recorded  Encounter Date: 01/02/2024   OT End of Session - 01/15/24 2207     Visit Number 12    Number of Visits 15    Date for Recertification  12/28/23    OT Start Time 1230    OT Stop Time 1330    OT Time Calculation (min) 60 min          Past Medical History:  Diagnosis Date   Anxiety    Depression    OCD (obsessive compulsive disorder)    Sleep apnea     Past Surgical History:  Procedure Laterality Date   KNEE SURGERY Bilateral    KNEE SURGERY      There were no vitals filed for this visit.   Subjective Assessment - 01/15/24 2207     Currently in Pain? No/denies    Pain Score 0-No pain            Group Session:  S: Doing better today.   O: During today's OT group session, the patient participated in an educational segment about the importance of goal-setting and the application of the SMART framework to enhance daily life, particularly focusing on ADLs and iADLs. The session began with five open-ended pre-session questions that facilitated group discussion and introspection about their current relationship with goals. Following the introduction and educational segment, participants engaged in brainstorming and group discussions to devise hypothetical SMART goals. The session concluded with five post-session questions to reinforce understanding and facilitate reflection. Throughout the session, there was a range of engagement levels noted among the participants.   A:  Patient demonstrated a high level of engagement throughout the session. They actively participated in discussions, sharing personal experiences related to goal setting and challenges faced. Patient was able to clearly articulate an understanding  of the SMART framework and proposed personal SMART goals related to their own ADLs with minimal assistance. They expressed enthusiasm about applying what they learned to their daily routine and appeared motivated to make changes.    P: Continue to attend PHP OT group sessions 5x week for 4 weeks to promote daily structure, social engagement, and opportunities to develop and utilize adaptive strategies to maximize functional performance in preparation for safe transition and integration back into school, work, and the community. Plan to address topic of tbd in next OT group session.                    OT Education - 01/15/24 2207     Education Details SMART Goals           OT Short Term Goals - 12/17/23 2052       OT SHORT TERM GOAL #1   Title Patient will develop and implement a structured daily routine that incorporates at least one productive, one self-care, and one leisure activity to improve consistency, balance, and sense of purpose throughout the week.    Time 4    Period Weeks    Status On-going    Target Date 12/28/23      OT SHORT TERM GOAL #2   Title Patient will identify one personal goal related to daily functioning or wellness and apply the SMART goal framework to create a clear, measurable, and achievable plan to improve engagement in meaningful  roles.    Status On-going      OT SHORT TERM GOAL #3   Title Patient will demonstrate improved motivation and initiation by identifying barriers to engagement and using at least one strategy (e.g., breaking tasks into smaller steps, scheduling, or reward systems) to follow through on selected activities.    Status On-going                   Plan - 01/15/24 2207     Psychosocial Skills Interpersonal Interaction;Habits;Environmental  Adaptations;Routines and Behaviors;Coping Strategies          Patient will benefit from skilled therapeutic intervention in order to improve the following deficits  and impairments:       Psychosocial Skills: Interpersonal Interaction, Habits, Environmental  Adaptations, Routines and Behaviors, Coping Strategies   Visit Diagnosis: Difficulty coping    Problem List Patient Active Problem List   Diagnosis Date Noted   MDD (major depressive disorder), recurrent severe, 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   Major depressive disorder, recurrent episode, severe (HCC) 12/05/2023   Generalized anxiety disorder 12/05/2023   Cluster C personality disorder (HCC) 12/05/2023   B12 deficiency 12/05/2023   Excoriation (skin-picking) disorder 12/05/2023   Major depressive disorder, recurrent episode, moderate (HCC) 12/02/2023   Posttraumatic stress disorder 11/04/2017   Bipolar II disorder (HCC) 11/04/2017    [name], OT 01/15/2024, 10:08 PM  Dallas Purpura, OT   [facility] [address] [place], [place], [address] Phone: [phone]   Fax:  [phone]  Name: Todd Tucker MRN: [id number] Date of Birth: [birth year]

## 2024-01-15 NOTE — Therapy (Signed)
 [facility] [address] [place], [place], [address] Phone: [phone]   Fax:  [phone]  Occupational Therapy Treatment  Patient Details  Name: Todd Tucker MRN: [id number] Date of Birth: 03-04-1980 No data recorded  Encounter Date: 01/01/2024   OT End of Session - 01/15/24 2136     Visit Number 11    Number of Visits 15    Date for Recertification  12/28/23    OT Start Time 1230    OT Stop Time 1330    OT Time Calculation (min) 60 min          Past Medical History:  Diagnosis Date   Anxiety    Depression    OCD (obsessive compulsive disorder)    Sleep apnea     Past Surgical History:  Procedure Laterality Date   KNEE SURGERY Bilateral    KNEE SURGERY      There were no vitals filed for this visit.   Subjective Assessment - 01/15/24 2136     Currently in Pain? No/denies    Pain Score 0-No pain                Group Session:  S: Doing good today. Had a good weekend, sleep is improving by a lot.  O: During today's OT group session, the patient participated in an educational segment about the importance of goal-setting and the application of the SMART framework to enhance daily life, particularly focusing on ADLs and iADLs. The session began with five open-ended pre-session questions that facilitated group discussion and introspection about their current relationship with goals. Following the introduction and educational segment, participants engaged in brainstorming and group discussions to devise hypothetical SMART goals. The session concluded with five post-session questions to reinforce understanding and facilitate reflection. Throughout the session, there was a range of engagement levels noted among the participants.   A:  Patient demonstrated a high level of engagement throughout the session. They actively participated in discussions, sharing personal experiences related to goal setting and challenges faced.  Patient was able to clearly articulate an understanding of the SMART framework and proposed personal SMART goals related to their own ADLs with minimal assistance. They expressed enthusiasm about applying what they learned to their daily routine and appeared motivated to make changes.    P: Continue to attend PHP OT group sessions 5x week for 4 weeks to promote daily structure, social engagement, and opportunities to develop and utilize adaptive strategies to maximize functional performance in preparation for safe transition and integration back into school, work, and the community. Plan to address topic of tbd in next OT group session.                OT Education - 01/15/24 2136     Education Details SMART Goals    Comprehension Verbalized understanding           OT Short Term Goals - 12/17/23 2052       OT SHORT TERM GOAL #1   Title Patient will develop and implement a structured daily routine that incorporates at least one productive, one self-care, and one leisure activity to improve consistency, balance, and sense of purpose throughout the week.    Time 4    Period Weeks    Status On-going    Target Date 12/28/23      OT SHORT TERM GOAL #2   Title Patient will identify one personal goal related to daily functioning or wellness and apply the SMART  goal framework to create a clear, measurable, and achievable plan to improve engagement in meaningful roles.    Status On-going      OT SHORT TERM GOAL #3   Title Patient will demonstrate improved motivation and initiation by identifying barriers to engagement and using at least one strategy (e.g., breaking tasks into smaller steps, scheduling, or reward systems) to follow through on selected activities.    Status On-going                   Plan - 01/15/24 2136     Psychosocial Skills Interpersonal Interaction;Habits;Environmental  Adaptations;Routines and Behaviors;Coping Strategies          Patient  will benefit from skilled therapeutic intervention in order to improve the following deficits and impairments:       Psychosocial Skills: Interpersonal Interaction, Habits, Environmental  Adaptations, Routines and Behaviors, Coping Strategies   Visit Diagnosis: Difficulty coping    Problem List Patient Active Problem List   Diagnosis Date Noted   MDD (major depressive disorder), recurrent severe, 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   Major depressive disorder, recurrent episode, severe (HCC) 12/05/2023   Generalized anxiety disorder 12/05/2023   Cluster C personality disorder (HCC) 12/05/2023   B12 deficiency 12/05/2023   Excoriation (skin-picking) disorder 12/05/2023   Major depressive disorder, recurrent episode, moderate (HCC) 12/02/2023   Posttraumatic stress disorder 11/04/2017   Bipolar II disorder (HCC) 11/04/2017    [name], OT 01/15/2024, 9:37 PM  Dallas Purpura, OT   [facility] [address] [place], [place], [address] Phone: [phone]   Fax:  [phone]  Name: Todd Tucker MRN: [id number] Date of Birth: 1980/09/28

## 2024-01-16 ENCOUNTER — Ambulatory Visit (INDEPENDENT_AMBULATORY_CARE_PROVIDER_SITE_OTHER)

## 2024-01-16 ENCOUNTER — Other Ambulatory Visit: Payer: Self-pay

## 2024-01-16 ENCOUNTER — Other Ambulatory Visit (HOSPITAL_BASED_OUTPATIENT_CLINIC_OR_DEPARTMENT_OTHER): Payer: Self-pay

## 2024-01-16 ENCOUNTER — Ambulatory Visit (HOSPITAL_COMMUNITY)

## 2024-01-16 ENCOUNTER — Encounter (HOSPITAL_COMMUNITY): Payer: Self-pay

## 2024-01-16 VITALS — BP 162/95 | HR 114 | Ht 67.0 in | Wt 255.0 lb

## 2024-01-16 DIAGNOSIS — Z7689 Persons encountering health services in other specified circumstances: Secondary | ICD-10-CM | POA: Diagnosis not present

## 2024-01-16 DIAGNOSIS — F3181 Bipolar II disorder: Secondary | ICD-10-CM

## 2024-01-16 DIAGNOSIS — F411 Generalized anxiety disorder: Secondary | ICD-10-CM

## 2024-01-16 DIAGNOSIS — F331 Major depressive disorder, recurrent, moderate: Secondary | ICD-10-CM

## 2024-01-16 MED ORDER — VENLAFAXINE HCL ER 150 MG PO CP24
150.0000 mg | ORAL_CAPSULE | Freq: Every day | ORAL | 0 refills | Status: DC
  Filled 2024-01-16 – 2024-02-06 (×2): qty 90, 90d supply, fill #0

## 2024-01-16 MED ORDER — DOXEPIN HCL 50 MG PO CAPS
50.0000 mg | ORAL_CAPSULE | Freq: Every day | ORAL | 0 refills | Status: DC
  Filled 2024-01-16: qty 30, 30d supply, fill #0

## 2024-01-16 MED ORDER — LAMOTRIGINE 200 MG PO TABS
200.0000 mg | ORAL_TABLET | Freq: Every day | ORAL | 0 refills | Status: DC
  Filled 2024-01-16: qty 90, 90d supply, fill #0

## 2024-01-16 MED ORDER — HYDROXYZINE HCL 25 MG PO TABS
25.0000 mg | ORAL_TABLET | Freq: Three times a day (TID) | ORAL | 2 refills | Status: DC | PRN
  Filled 2024-01-16 – 2024-02-06 (×2): qty 45, 15d supply, fill #0

## 2024-01-16 NOTE — Addendum Note (Signed)
 Addended by: MERCY DOMINO A on: 01/16/2024 03:22 PM   Modules accepted: Level of Service

## 2024-01-19 NOTE — Psych (Signed)
 [facility] PHP THERAPIST PROGRESS NOTE  Nichola Alvis [id number]  Session Time: 9:00 - 10:00  Participation Level: Active  Behavioral Response: CasualAlertAnxious and Euthymic  Type of Therapy: Group Therapy  Treatment Goals addressed: Coping  Progress Towards Goals: Progressing  Interventions: CBT, DBT, Supportive, and Reframing  Summary: Todd Tucker is a 44 y.o. adult who presents with depression and anxiety symptoms.  Clinician led check-in regarding current stressors and situation, and review of patient completed daily inventory. Clinician utilized active listening and empathetic response and validated patient emotions. Clinician facilitated processing group on pertinent issues.?   Summary: Patient arrived within time allowed. Patient rates their depression at a 3 and anxiety at a 5 on a scale of 1-10 with 10 being high. Pt reports they slept 5 hours last night and ate 5x yesterday. Pt reports he continues to follow his simple routine and believes the structure and slower days are helping him heal. Pt states mild success with utilizing coping skills to occupy his mind when it gets negative.  Pt able to process.?Pt engaged in discussion.?       Session Time: 10:00 am - 11:00 am  Participation Level: Active  Behavioral Response: CasualAlertAnxious and Depressed  Type of Therapy: Group Therapy  Treatment Goals addressed: Coping  Progress Towards Goals: Progressing  Interventions: CBT, DBT, Solution Focused, Strength-based, Supportive, and Reframing  Therapist Response: Cln led processing group for pt's current struggles. Group members shared stressors and provided support and feedback. Cln brought in topics of boundaries, healthy relationships, and unhealthy thought processes to inform discussion.    Summary:  Pt able to process and provide support to group.          Session Time: 11:00 am - 12:00 pm   Participation Level: Active   Behavioral Response:  CasualAlertAnxious   Type of Therapy: Group Therapy   Treatment Goals addressed: Coping   Progress Towards Goals: Progressing   Interventions: CBT, DBT, Solution Focused, Strength-based, Supportive, and Reframing   Therapist Response: Cln introduced DBT distress tolerance skill IMPROVE.  Cln discussed how this set of skills are for when you have to sit through an undesirable feeling and wait for it to pass. Group discussed how to apply the IMPROVE skills to decrease distress at the undesired feeling.    Summary:   Pt engaged in discussion and is able to identify ways to apply the skill.              Session Time: 12:00 pm - 12:30 pm   Participation Level: Active   Behavioral Response: CasualAlertAnxious   Type of Therapy: Group Therapy   Treatment Goals addressed: Coping   Progress Towards Goals: Progressing   Interventions: Psychologist, Occupational, Supportive   Therapist Response: Reflection Group: Patients encouraged to practice skills and interpersonal techniques or work on mindfulness and relaxation techniques. The importance of self-care and making skills part of a routine to increase usage were stressed.   Summary: Patient engaged and participated appropriately.           Session Time: 12:30 pm - 1:30 pm   Participation Level: Active   Behavioral Response: CasualAlertAnxious   Type of Therapy: Group Therapy   Treatment Goals addressed: Coping   Progress Towards Goals: Progressing   Interventions: Occupational Therapy   Therapist Response: Cln E Hollan led occupational therapy group.    Summary: See note           Session Time: 1:30 pm - 1:45 pm   Participation Level:  Active   Behavioral Response: CasualAlertAnxious   Type of Therapy: Group Therapy   Treatment Goals addressed: Coping   Progress Towards Goals: Progressing   Interventions: CBT, DBT, Solution Focused, Strength-based, Supportive, and Reframing   Therapist Response: 1:30 pm  - 1:45 pm: Clinician led check-out. Clinician assessed for immediate needs, medication compliance and efficacy, and safety concerns?   Summary: 1:30 pm - 1:45 pm: At check-out, patient reports no immediate concerns. Patient demonstrates progress as evidenced by engagement and responsiveness to treatment. Patient denies SI/HI/self-harm thoughts at the end of group.    Suicidal/Homicidal: Nowithout intent/plan  Plan: Pt will continue in PHP while working to decrease depression and anxiety symptoms, increase daily functioning, and increase ability to manage symptoms in a healthy manner.   Collaboration of Care: Medication Management AEB T Lewis, NP  Patient/Guardian was advised Release of Information must be obtained prior to any record release in order to collaborate their care with an outside provider. Patient/Guardian was advised if they have not already done so to contact the registration department to sign all necessary forms in order for us  to release information regarding their care.   Consent: Patient/Guardian gives verbal consent for treatment and assignment of benefits for services provided during this visit. Patient/Guardian expressed understanding and agreed to proceed.   Diagnosis: Bipolar II disorder (HCC) [F31.81]    1. Bipolar II disorder (HCC)       Randall Bastos, LCSW

## 2024-01-19 NOTE — Psych (Signed)
 [facility] PHP THERAPIST PROGRESS NOTE  Ewan Rondeau [id number]  Session Time: 9:00 - 10:00  Participation Level: Active  Behavioral Response: CasualAlertAnxious and Euthymic  Type of Therapy: Group Therapy  Treatment Goals addressed: Coping  Progress Towards Goals: Progressing  Interventions: CBT, DBT, Supportive, and Reframing  Summary: Linton Conerly is a 43 y.o. adult who presents with depression and anxiety symptoms.  Clinician led check-in regarding current stressors and situation, and review of patient completed daily inventory. Clinician utilized active listening and empathetic response and validated patient emotions. Clinician facilitated processing group on pertinent issues.?   Summary: Patient arrived within time allowed. Patient rates their depression at a 8 and anxiety at a 9 on a scale of 1-10 with 10 being high. Pt reports they slept 8 hours last night and ate 5x yesterday. Pt reports he is having people reach out to him about help with reviewing football tape and it makes me feel really good however it also makes me feel bad because I lost all of it. Pt reports feelings of hopelessness when ruminating about the past and denied experiencing SI/SH thoughts. Pt able to process.?Pt engaged in discussion.?       Session Time: 10:00 am - 11:00 am  Participation Level: Active  Behavioral Response: CasualAlertAnxious and Depressed  Type of Therapy: Group Therapy  Treatment Goals addressed: Coping  Progress Towards Goals: Initial  Interventions: CBT, DBT, Solution Focused, Strength-based, Supportive, and Reframing  Therapist Response: Cln introduced I statements and the ways in which they can improve assertiveness and overall communication. Cln educated group on I statement formula and led practice.    Summary:  Pt engaged in discussion and demonstrates understanding of how to utilize I statements.         Session Time: 11:00 am - 12:00 pm   Participation  Level: Active   Behavioral Response: CasualAlertAnxious   Type of Therapy: Group Therapy   Treatment Goals addressed: Coping   Progress Towards Goals: Progressing   Interventions: CBT, DBT, Solution Focused, Strength-based, Supportive, and Reframing   Therapist Response: Cln continued discussion on communication and utilized hand out [employer] to discuss ways to communicate in difficult or delicate situations. Group discussed the strategies and which were most problematic for them. Cln shaped discussion and provided clarity and guidance in how to apply.    Summary:  Pt engaged in discussion.              Session Time: 12:00 pm - 12:30 pm   Participation Level: Active   Behavioral Response: CasualAlertAnxious   Type of Therapy: Group Therapy   Treatment Goals addressed: Coping   Progress Towards Goals: Progressing   Interventions: Psychologist, Occupational, Supportive   Therapist Response: Reflection Group: Patients encouraged to practice skills and interpersonal techniques or work on mindfulness and relaxation techniques. The importance of self-care and making skills part of a routine to increase usage were stressed.   Summary: Patient engaged and participated appropriately.           Session Time: 12:30 pm - 1:30 pm   Participation Level: Active   Behavioral Response: CasualAlertAnxious   Type of Therapy: Group Therapy   Treatment Goals addressed: Coping   Progress Towards Goals: Progressing   Interventions: CBT, DBT, Solution Focused, Strength-based, Supportive, and Reframing   Therapist Response: Cln continued topic of CBT cognitive distortions and introduced thought challenging as a way to  utilize the challenge C in C-C-C. Group utilized Designer, Television/film Set questions as a way  to introduce challenges and reframe distorted thinking. Group members worked through pt examples to practice challenging distorted thinking.    Summary: Pt engaged and  participated in discussion.           Session Time: 1:30 pm - 1:45 pm   Participation Level: Active   Behavioral Response: CasualAlertAnxious   Type of Therapy: Group Therapy   Treatment Goals addressed: Coping   Progress Towards Goals: Progressing   Interventions: CBT, DBT, Solution Focused, Strength-based, Supportive, and Reframing   Therapist Response: 1:30 pm - 1:45 pm: Clinician led check-out. Clinician assessed for immediate needs, medication compliance and efficacy, and safety concerns?   Summary: 1:30 pm - 1:45 pm: At check-out, patient reports no immediate concerns. Patient demonstrates progress as evidenced by engagement and responsiveness to treatment. Patient denies SI/HI/self-harm thoughts at the end of group.    Suicidal/Homicidal: Nowithout intent/plan  Plan: Pt will continue in PHP while working to decrease depression and anxiety symptoms, increase daily functioning, and increase ability to manage symptoms in a healthy manner.   Collaboration of Care: Medication Management AEB T Lewis, NP  Patient/Guardian was advised Release of Information must be obtained prior to any record release in order to collaborate their care with an outside provider. Patient/Guardian was advised if they have not already done so to contact the registration department to sign all necessary forms in order for us  to release information regarding their care.   Consent: Patient/Guardian gives verbal consent for treatment and assignment of benefits for services provided during this visit. Patient/Guardian expressed understanding and agreed to proceed.   Diagnosis: Bipolar II disorder (HCC) [F31.81]    1. Bipolar II disorder (HCC)   2. MDD (major depressive disorder), recurrent severe, without psychosis (HCC)       Randall Bastos, LCSW

## 2024-01-21 ENCOUNTER — Encounter (HOSPITAL_COMMUNITY): Payer: Self-pay

## 2024-01-21 NOTE — Therapy (Signed)
 [facility] [address] [place], [place], [address] Phone: [phone]   Fax:  [phone]  Occupational Therapy Treatment  Patient Details  Name: Todd Tucker MRN: [id number] Date of Birth: 1980/03/04 No data recorded  Encounter Date: 01/05/2024   OT End of Session - 01/21/24 2102     Visit Number 15    Number of Visits 15    Date for Recertification  12/28/23    OT Start Time 1200    OT Stop Time 1250    OT Time Calculation (min) 50 min          Past Medical History:  Diagnosis Date   Anxiety    Depression    OCD (obsessive compulsive disorder)    Sleep apnea     Past Surgical History:  Procedure Laterality Date   KNEE SURGERY Bilateral    KNEE SURGERY      There were no vitals filed for this visit.   Subjective Assessment - 01/21/24 2102     Currently in Pain? No/denies    Pain Score 0-No pain               Group Session:  S: Doing good today.   O: The objective of today's group is to provide a comprehensive understanding of the concept of motivation and its role in human behavior and well-being. The content covers various theories of motivation, including intrinsic and extrinsic motivators, and explores the psychological mechanisms that drive individuals to achieve goals, overcome obstacles, and make decisions. By diving into real-world applications, the group aims to offer actionable strategies for enhancing motivation in different life domains, such as work, relationships, and personal growth.  Utilizing a multi-disciplinary approach, this group integrates insights from psychology, neuroscience, and behavioral economics to present a holistic view of motivation. The objective is not only to educate the audience about the complexities and driving forces behind motivation but also to equip them with practical tools and techniques to improve their own motivation levels. By the end of this multi-day group, patient's  should have a well-rounded understanding of what motivates human actions and how to harness this knowledge for personal and professional betterment.   A: The patient demonstrates a high level of engagement during the session, actively participating in discussions about motivation theories and their applicability to their own life. They show keen interest in learning new strategies to improve their motivation and even offer examples from their own experiences that align with the theories presented. Their level of self-awareness and willingness to invest in self-improvement suggest that they are well-positioned to benefit from the practical tools and techniques discussed. The patient's ability to articulate their goals and challenges further supports the likelihood of successfully implementing the strategies presented.    P: Continue to attend PHP OT group sessions 5x week for 4 weeks to promote daily structure, social engagement, and opportunities to develop and utilize adaptive strategies to maximize functional performance in preparation for safe transition and integration back into school, work, and the community. Plan to address topic of tbd in next OT group session.                 OT Education - 01/21/24 2102     Education Details Motivation           OT Short Term Goals - 12/17/23 2052       OT SHORT TERM GOAL #1   Title Patient will develop and implement a structured daily routine that  incorporates at least one productive, one self-care, and one leisure activity to improve consistency, balance, and sense of purpose throughout the week.    Time 4    Period Weeks    Status On-going    Target Date 12/28/23      OT SHORT TERM GOAL #2   Title Patient will identify one personal goal related to daily functioning or wellness and apply the SMART goal framework to create a clear, measurable, and achievable plan to improve engagement in meaningful roles.    Status On-going       OT SHORT TERM GOAL #3   Title Patient will demonstrate improved motivation and initiation by identifying barriers to engagement and using at least one strategy (e.g., breaking tasks into smaller steps, scheduling, or reward systems) to follow through on selected activities.    Status On-going                   Plan - 01/21/24 2103     Psychosocial Skills Interpersonal Interaction;Habits;Environmental  Adaptations;Routines and Behaviors;Coping Strategies          Patient will benefit from skilled therapeutic intervention in order to improve the following deficits and impairments:       Psychosocial Skills: Interpersonal Interaction, Habits, Environmental  Adaptations, Routines and Behaviors, Coping Strategies   Visit Diagnosis: Difficulty coping    Problem List Patient Active Problem List   Diagnosis Date Noted   MDD (major depressive disorder), recurrent severe, 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   Major depressive disorder, recurrent episode, severe (HCC) 12/05/2023   Generalized anxiety disorder 12/05/2023   Cluster C personality disorder (HCC) 12/05/2023   B12 deficiency 12/05/2023   Excoriation (skin-picking) disorder 12/05/2023   Major depressive disorder, recurrent episode, moderate (HCC) 12/02/2023   Posttraumatic stress disorder 11/04/2017   Bipolar II disorder (HCC) 11/04/2017    [name], OT 01/21/2024, 9:03 PM  Dallas Purpura, OT   [facility] [address] [place], [place], [address] Phone: [phone]   Fax:  [phone]  Name: Todd Tucker MRN: [id number] Date of Birth: 16-Mar-1980

## 2024-01-21 NOTE — Psych (Signed)
 [facility] BH PHP THERAPIST PROGRESS NOTE  Ulas Meadow [id number]  Session Time: 9:00 - 10:00  Participation Level: Active  Behavioral Response: CasualAlertAnxious  Type of Therapy: Group Therapy  Treatment Goals addressed: Coping  Progress Towards Goals: Progressing  Interventions: CBT, DBT, Supportive, and Reframing  Summary: Seiji Geyer is a 43 y.o. adult who presents with depression and anxiety symptoms.  Clinician led check-in regarding current stressors and situation, and review of patient completed daily inventory. Clinician utilized active listening and empathetic response and validated patient emotions. Clinician facilitated processing group on pertinent issues.?   Summary: Patient arrived within time allowed. Patient rates their depression at a 2 and anxiety at a 5 on a scale of 1-10 with 10 being high. Pt reports they slept 6 hours last night and ate 5x yesterday. Pt reports his low mood persisted yesterday and he avoided his parents and skipped his daily phone with friend. Pt reports he followed most of his routine despite not being able to give his all. Pt reports being able to recognize progress in how he handled the bad day yesterday. Pt able to process.?Pt engaged in discussion.?       Session Time: 10:00 am - 11:00 am  Participation Level: Active  Behavioral Response: CasualAlertAnxious and Depressed  Type of Therapy: Group Therapy  Treatment Goals addressed: Coping  Progress Towards Goals: Progressing  Interventions: CBT, DBT, Solution Focused, Strength-based, Supportive, and Reframing  Therapist Response: Cln led discussion on communicating our struggles with our support team. Cln introduced the concept of giving disclosures to the people close to us  regarding the way we act in specific situations or the way we process certain stimuli. Group members discussed ways to provide this road map to our brain and in what situations this may be helpful to them.    Summary: Pt engaged in discussion and is able to determine situations in which providing a disclosure can be helpful and practiced doing so.          Session Time: 11:00 am - 12:00 pm   Participation Level: Active   Behavioral Response: Casual Alert and Anxious/Depressed   Type of Therapy: Group Therapy   Treatment Goals addressed: Coping   Progress Towards Goals: Progressing   Interventions: CBT, DBT, Solution Focused, Strength-based, Supportive, and Reframing   Therapist Response: Cln continued topic of boundaries. Cln discussed the different ways boundaries present: physical, emotional, intellectual, sexual, material, and time. Group talked about the ways in which each type presents for them and is a struggle. Cln introduced how to set and maintain healthy boundaries and group members worked through examples to practice setting appropriate boundaries.    Summary: Pt engaged in discussion and is able to identify most problematic areas.            Session Time: 12:00 pm - 12:50 pm   Participation Level: Active   Behavioral Response: Casual Alert and Anxious/Depressed   Type of Therapy: Group Therapy   Treatment Goals addressed: Coping   Progress Towards Goals: Progressing   Interventions: OT group   Therapist Response: Group was led by occupational therapist, Edward Hollan.    Summary: Pt engaged in discussion.            Session Time: 12:50 pm - 1:00 pm   Participation Level: Active   Behavioral Response: CasualAlertDepressedandAnxious   Type of Therapy: Group Therapy   Treatment Goals addressed: Coping   Progress Towards Goals: Progressing   Interventions: CBT, DBT, Solution Focused, Strength-based, Supportive, and  Reframing   Therapist Response: 12:50-1:00pm: Clinician led check-out. Clinician assessed for immediate needs, medication compliance and efficacy, and safety concerns?   Summary: 12:50-1:00pm: At check-out, patient reports no immediate  concerns. Patient demonstrates progress as evidenced by engagement and responsiveness to treatment. Patient denies SI/HI/self-harm thoughts at the end of group.    Suicidal/Homicidal: Nowithout intent/plan  Plan: Pt will discharge from PHP due to meeting treatment goals of decreased depression and anxiety symptoms, increased daily functioning, and increased ability to manage symptoms in a healthy manner. Pt will f/u within this agency for continuing outpatient care. Pt and provider are aligned with discharge. Pt denies SI/HI at time of discharge.   Collaboration of Care: Medication Management AEB T Lewis, NP  Patient/Guardian was advised Release of Information must be obtained prior to any record release in order to collaborate their care with an outside provider. Patient/Guardian was advised if they have not already done so to contact the registration department to sign all necessary forms in order for us  to release information regarding their care.   Consent: Patient/Guardian gives verbal consent for treatment and assignment of benefits for services provided during this visit. Patient/Guardian expressed understanding and agreed to proceed.   Diagnosis: Bipolar II disorder (HCC) [F31.81]    1. Bipolar II disorder (HCC)   2. Mixed obsessional thoughts and acts       [employer], LCSW

## 2024-01-21 NOTE — Psych (Signed)
 [facility] BH PHP THERAPIST PROGRESS NOTE  Wilhelm Zeiter [id number]  Session Time: 9:00 - 10:00  Participation Level: Active  Behavioral Response: CasualAlertAnxious  Type of Therapy: Group Therapy  Treatment Goals addressed: Coping  Progress Towards Goals: Progressing  Interventions: CBT, DBT, Supportive, and Reframing  Summary: Todd Tucker is a 43 y.o. adult who presents with depression and anxiety symptoms.  Clinician led check-in regarding current stressors and situation, and review of patient completed daily inventory. Clinician utilized active listening and empathetic response and validated patient emotions. Clinician facilitated processing group on pertinent issues.?   Summary: Patient arrived within time allowed. Patient rates their depression at a 2 and anxiety at a 1 on a scale of 1-10 with 10 being high. Pt reports they slept 7 hours last night and ate 5x yesterday. Pt reports having increased thoughts about his wife and their marriage, identifying that it's a much bigger problem than he gave it credit for. Pt reports quickly spiraling into anger and hurt when he thinks about her. Pt able to process.?Pt engaged in discussion.?       Session Time: 10:00 am - 11:00 am  Participation Level: Active  Behavioral Response: CasualAlertAnxious and Depressed  Type of Therapy: Group Therapy  Treatment Goals addressed: Coping  Progress Towards Goals: Progressing  Interventions: CBT, DBT, Solution Focused, Strength-based, Supportive, and Reframing  Therapist Response: Cln led discussion on unrealistic expectations and the ways expectations can alter perspective. Group members discussed feelings and situations that have occurred due to expectations and how to know if they are unrealistic. Cln brought in CBT thought challenging and reframing to aid discussion.    Summary: Pt engaged in discussion and reports gaining insight.           Session Time: 11:00 am - 12:00 pm    Participation Level: Active   Behavioral Response: CasualAlertAnxious   Type of Therapy: Group Therapy   Treatment Goals addressed: Coping   Progress Towards Goals: Progressing   Interventions: CBT, DBT, Solution Focused, Strength-based, Supportive, and Reframing   Therapist Response: Cln led discussion on procrastination. Cln encouraged pt's to consider the feeling that is feeding procrastination and address it as a way of alleviating desire to procrastinate. Cln applied CBT thought challenging and DBT distress tolerance skills to aid discussion.    Summary:  Pt engaged in discussion and is able to identify the root feeling of their procrastination.              Session Time: 12:00 pm - 12:30 pm   Participation Level: Active   Behavioral Response: CasualAlertAnxious   Type of Therapy: Group Therapy   Treatment Goals addressed: Coping   Progress Towards Goals: Progressing   Interventions: Psychologist, Occupational, Supportive   Therapist Response: Reflection Group: Patients encouraged to practice skills and interpersonal techniques or work on mindfulness and relaxation techniques. The importance of self-care and making skills part of a routine to increase usage were stressed.   Summary: Patient engaged and participated appropriately.           Session Time: 12:30 pm - 1:30 pm   Participation Level: Active   Behavioral Response: CasualAlertAnxious   Type of Therapy: Group Therapy   Treatment Goals addressed: Coping   Progress Towards Goals: Progressing   Interventions: Occupational Therapy   Therapist Response: Cln E Hollan led occupational therapy group.    Summary: See note           Session Time: 1:30 pm - 1:45 pm  Participation Level: Active   Behavioral Response: CasualAlertAnxious   Type of Therapy: Group Therapy   Treatment Goals addressed: Coping   Progress Towards Goals: Progressing   Interventions: CBT, DBT, Solution Focused,  Strength-based, Supportive, and Reframing   Therapist Response: 1:30 pm - 1:45 pm: Clinician led check-out. Clinician assessed for immediate needs, medication compliance and efficacy, and safety concerns?   Summary: 1:30 pm - 1:45 pm: At check-out, patient reports no immediate concerns. Patient demonstrates progress as evidenced by engagement and responsiveness to treatment. Patient denies SI/HI/self-harm thoughts at the end of group.    Suicidal/Homicidal: Nowithout intent/plan  Plan: Pt will continue in PHP while working to decrease depression and anxiety symptoms, increase daily functioning, and increase ability to manage symptoms in a healthy manner.   Collaboration of Care: Medication Management AEB T Lewis, NP  Patient/Guardian was advised Release of Information must be obtained prior to any record release in order to collaborate their care with an outside provider. Patient/Guardian was advised if they have not already done so to contact the registration department to sign all necessary forms in order for us  to release information regarding their care.   Consent: Patient/Guardian gives verbal consent for treatment and assignment of benefits for services provided during this visit. Patient/Guardian expressed understanding and agreed to proceed.   Diagnosis: Bipolar II disorder (HCC) [F31.81]    1. Bipolar II disorder (HCC)       Randall Bastos, LCSW

## 2024-01-21 NOTE — Therapy (Signed)
 [facility] [address] [place], [place], [address] Phone: [phone]   Fax:  [phone]  Occupational Therapy Treatment  Patient Details  Name: Newman Waren MRN: [id number] Date of Birth: Feb 02, 1981 No data recorded  Encounter Date: 01/04/2024   OT End of Session - 01/21/24 2051     Visit Number 14    Number of Visits 15    Date for Recertification  12/28/23    OT Start Time 1230    OT Stop Time 1330    OT Time Calculation (min) 60 min          Past Medical History:  Diagnosis Date   Anxiety    Depression    OCD (obsessive compulsive disorder)    Sleep apnea     Past Surgical History:  Procedure Laterality Date   KNEE SURGERY Bilateral    KNEE SURGERY      There were no vitals filed for this visit.   Subjective Assessment - 01/21/24 2051     Currently in Pain? No/denies    Pain Score 0-No pain                Group Session:  S: Feeling prertty good today. Sleeping much better lately.   O: The objective of today's group is to provide a comprehensive understanding of the concept of motivation and its role in human behavior and well-being. The content covers various theories of motivation, including intrinsic and extrinsic motivators, and explores the psychological mechanisms that drive individuals to achieve goals, overcome obstacles, and make decisions. By diving into real-world applications, the group aims to offer actionable strategies for enhancing motivation in different life domains, such as work, relationships, and personal growth.  Utilizing a multi-disciplinary approach, this group integrates insights from psychology, neuroscience, and behavioral economics to present a holistic view of motivation. The objective is not only to educate the audience about the complexities and driving forces behind motivation but also to equip them with practical tools and techniques to improve their own motivation levels. By  the end of this multi-day group, patient's should have a well-rounded understanding of what motivates human actions and how to harness this knowledge for personal and professional betterment.   A: The patient demonstrates a high level of engagement during the session, actively participating in discussions about motivation theories and their applicability to their own life. They show keen interest in learning new strategies to improve their motivation and even offer examples from their own experiences that align with the theories presented. Their level of self-awareness and willingness to invest in self-improvement suggest that they are well-positioned to benefit from the practical tools and techniques discussed. The patient's ability to articulate their goals and challenges further supports the likelihood of successfully implementing the strategies presented.    P: Continue to attend PHP OT group sessions 5x week for 4 weeks to promote daily structure, social engagement, and opportunities to develop and utilize adaptive strategies to maximize functional performance in preparation for safe transition and integration back into school, work, and the community. Plan to address topic of tbd in next OT group session.                OT Education - 01/21/24 2051     Education Details Motivation           OT Short Term Goals - 12/17/23 2052       OT SHORT TERM GOAL #1   Title Patient will develop and implement  a structured daily routine that incorporates at least one productive, one self-care, and one leisure activity to improve consistency, balance, and sense of purpose throughout the week.    Time 4    Period Weeks    Status On-going    Target Date 12/28/23      OT SHORT TERM GOAL #2   Title Patient will identify one personal goal related to daily functioning or wellness and apply the SMART goal framework to create a clear, measurable, and achievable plan to improve engagement in  meaningful roles.    Status On-going      OT SHORT TERM GOAL #3   Title Patient will demonstrate improved motivation and initiation by identifying barriers to engagement and using at least one strategy (e.g., breaking tasks into smaller steps, scheduling, or reward systems) to follow through on selected activities.    Status On-going                   Plan - 01/21/24 2051     Psychosocial Skills Interpersonal Interaction;Habits;Environmental  Adaptations;Routines and Behaviors;Coping Strategies          Patient will benefit from skilled therapeutic intervention in order to improve the following deficits and impairments:       Psychosocial Skills: Interpersonal Interaction, Habits, Environmental  Adaptations, Routines and Behaviors, Coping Strategies   Visit Diagnosis: Difficulty coping    Problem List Patient Active Problem List   Diagnosis Date Noted   MDD (major depressive disorder), recurrent severe, 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   Major depressive disorder, recurrent episode, severe (HCC) 12/05/2023   Generalized anxiety disorder 12/05/2023   Cluster C personality disorder (HCC) 12/05/2023   B12 deficiency 12/05/2023   Excoriation (skin-picking) disorder 12/05/2023   Major depressive disorder, recurrent episode, moderate (HCC) 12/02/2023   Posttraumatic stress disorder 11/04/2017   Bipolar II disorder (HCC) 11/04/2017    [name], OT 01/21/2024, 8:51 PM  Dallas Purpura, OT   [facility] [address] [place], [place], [address] Phone: [phone]   Fax:  [phone]  Name: Lawrence Mitch MRN: [id number] Date of Birth: Oct 13, 1980

## 2024-01-21 NOTE — Psych (Signed)
 [facility] BH PHP THERAPIST PROGRESS NOTE  Nilton Schmutz [id number]  Session Time: 9:00 - 10:00  Participation Level: Active  Behavioral Response: CasualAlertAnxious and Euthymic  Type of Therapy: Group Therapy  Treatment Goals addressed: Coping  Progress Towards Goals: Progressing  Interventions: CBT, DBT, Supportive, and Reframing  Summary: Todd Tucker is a 43 y.o. adult who presents with depression and anxiety symptoms.  Clinician led check-in regarding current stressors and situation, and review of patient completed daily inventory. Clinician utilized active listening and empathetic response and validated patient emotions. Clinician facilitated processing group on pertinent issues.?   Summary: Patient arrived within time allowed. Patient rates their depression at a 4 and anxiety at a 3 on a scale of 1-10 with 10 being high. Pt reports they slept 5 hours last night and ate 5x yesterday. Pt reports he is feeling better overall and thinks his medications and therapy are helping. Pt reports high anxiety regarding the future and past. Pt struggles with rigid thinking and self judgment. Pt able to process.?Pt engaged in discussion.?       Session Time: 10:00 am - 11:00 am  Participation Level: Active  Behavioral Response: CasualAlertAnxious and Depressed  Type of Therapy: Group Therapy  Treatment Goals addressed: Coping  Progress Towards Goals: Progressing  Interventions: CBT, DBT, Solution Focused, Strength-based, Supportive, and Reframing  Therapist Response: Cln led processing group for pt's current struggles. Group members shared stressors and provided support and feedback. Cln brought in topics of boundaries, healthy relationships, and unhealthy thought processes to inform discussion.    Summary:  Pt able to process and provide support to group.          Session Time: 11:00 am - 12:00 pm   Participation Level: Active   Behavioral Response: CasualAlertAnxious   Type  of Therapy: Group Therapy   Treatment Goals addressed: Coping   Progress Towards Goals: Progressing   Interventions: CBT, DBT, Solution Focused, Strength-based, Supportive, and Reframing   Therapist Response: Cln led discussion on spiraling or when our thoughts compound on one another to descend our feelings into worse and worse situations. Group members discussed the ways in which spiraling is difficult for them and when they are especially vulnerable. Cln offered DBT STOP and distraction skills as a way to halt the spiral once you recognize it.    Summary:  Pt engaged in discussion and reports spiraling is a major issue for them. Pt is able to increase awareness of spiraling throughout the discussion.         Session Time: 12:00 pm - 12:50 pm   Participation Level: Active   Behavioral Response: CasualAlertAnxious   Type of Therapy: Group Therapy   Treatment Goals addressed: Coping   Progress Towards Goals: Progressing   Interventions: Occupational Therapy   Therapist Response: Cln E Hollan led occupational therapy group.    Summary: See note           Session Time: 12:50 pm - 1:00 pm   Participation Level: Active   Behavioral Response: CasualAlertAnxious   Type of Therapy: Group Therapy   Treatment Goals addressed: Coping   Progress Towards Goals: Progressing   Interventions: CBT, DBT, Solution Focused, Strength-based, Supportive, and Reframing   Therapist Response: 12:50 pm - 1:00 pm: Clinician led check-out. Clinician assessed for immediate needs, medication compliance and efficacy, and safety concerns?   Summary: 12:50 pm - 1:00 pm: At check-out, patient reports no immediate concerns. Patient demonstrates progress as evidenced by engagement and responsiveness to treatment. Patient denies  SI/HI/self-harm thoughts at the end of group.    Suicidal/Homicidal: Nowithout intent/plan  Plan: Pt will continue in PHP while working to decrease depression and  anxiety symptoms, increase daily functioning, and increase ability to manage symptoms in a healthy manner.   Collaboration of Care: Medication Management AEB T Lewis, NP  Patient/Guardian was advised Release of Information must be obtained prior to any record release in order to collaborate their care with an outside provider. Patient/Guardian was advised if they have not already done so to contact the registration department to sign all necessary forms in order for us  to release information regarding their care.   Consent: Patient/Guardian gives verbal consent for treatment and assignment of benefits for services provided during this visit. Patient/Guardian expressed understanding and agreed to proceed.   Diagnosis: Bipolar II disorder (HCC) [F31.81]    1. Bipolar II disorder (HCC)       Randall Bastos, LCSW

## 2024-01-21 NOTE — Psych (Signed)
 [facility] BH PHP THERAPIST PROGRESS NOTE  Todd Tucker [id number]  Session Time: 9:00 - 10:00  Participation Level: Active  Behavioral Response: CasualAlertAnxious  Type of Therapy: Group Therapy  Treatment Goals addressed: Coping  Progress Towards Goals: Progressing  Interventions: CBT, DBT, Supportive, and Reframing  Summary: Todd Tucker is a 43 y.o. adult who presents with depression and anxiety symptoms.  Clinician led check-in regarding current stressors and situation, and review of patient completed daily inventory. Clinician utilized active listening and empathetic response and validated patient emotions. Clinician facilitated processing group on pertinent issues.?   Summary: Patient arrived within time allowed. Patient rates their depression at a 2 and anxiety at a 6 on a scale of 1-10 with 10 being high. Pt reports they slept 5 hours last night and ate 5x yesterday. Pt reports he feels poopy and it hit me like a ton of bricks this morning. Pt states he is trying to sit in the feeling and let it pass, but is struggling not to get caught in rumination. Pt reports struggle with anxiety re: upcoming discharge. Pt able to process.?Pt engaged in discussion.?       Session Time: 10:00 am - 11:00 am  Participation Level: Active  Behavioral Response: CasualAlertAnxious and Depressed  Type of Therapy: Group Therapy  Treatment Goals addressed: Coping  Progress Towards Goals: Progressing  Interventions: CBT, DBT, Solution Focused, Strength-based, Supportive, and Reframing  Therapist Response: Cln led discussion on routine and the role it can play in recovery. Group members shared ways in which routine is helpful and/or difficult for them. Cln encouraged pt's to consider doing the same thing in the same order more than focusing on tasks being set at a specific time.    Summary: Pt engaged in discussion.           Session Time: 11:00 am - 12:00 pm   Participation Level:  Active   Behavioral Response: CasualAlertAnxious   Type of Therapy: Group Therapy   Treatment Goals addressed: Coping   Progress Towards Goals: Progressing   Interventions: CBT, DBT, Solution Focused, Strength-based, Supportive, and Reframing   Therapist Response: Cln introduced topic of boundaries. Cln discussed how boundaries inform our relationships and affect self-esteem and personal agency. Group discussed the three types of boundaries: rigid, porous, and healthy and when each type is most helpful/harmful.    Summary: Pt engaged in discussion and is able to identify each type in their life.            Session Time: 12:00 pm - 12:30 pm   Participation Level: Active   Behavioral Response: CasualAlertAnxious   Type of Therapy: Group Therapy   Treatment Goals addressed: Coping   Progress Towards Goals: Progressing   Interventions: Psychologist, Occupational, Supportive   Therapist Response: Reflection Group: Patients encouraged to practice skills and interpersonal techniques or work on mindfulness and relaxation techniques. The importance of self-care and making skills part of a routine to increase usage were stressed.   Summary: Patient engaged and participated appropriately.           Session Time: 12:30 pm - 1:30 pm   Participation Level: Active   Behavioral Response: CasualAlertAnxious   Type of Therapy: Group Therapy   Treatment Goals addressed: Coping   Progress Towards Goals: Progressing   Interventions: Occupational Therapy   Therapist Response: Cln E Hollan led occupational therapy group.    Summary: See note           Session Time: 1:30 pm -  1:45 pm   Participation Level: Active   Behavioral Response: CasualAlertAnxious   Type of Therapy: Group Therapy   Treatment Goals addressed: Coping   Progress Towards Goals: Progressing   Interventions: CBT, DBT, Solution Focused, Strength-based, Supportive, and Reframing   Therapist Response:  1:30 pm - 1:45 pm: Clinician led check-out. Clinician assessed for immediate needs, medication compliance and efficacy, and safety concerns?   Summary: 1:30 pm - 1:45 pm: At check-out, patient reports no immediate concerns. Patient demonstrates progress as evidenced by engagement and responsiveness to treatment. Patient denies SI/HI/self-harm thoughts at the end of group.    Suicidal/Homicidal: Nowithout intent/plan  Plan: Pt will continue in PHP while working to decrease depression and anxiety symptoms, increase daily functioning, and increase ability to manage symptoms in a healthy manner.   Collaboration of Care: Medication Management AEB T Lewis, NP  Patient/Guardian was advised Release of Information must be obtained prior to any record release in order to collaborate their care with an outside provider. Patient/Guardian was advised if they have not already done so to contact the registration department to sign all necessary forms in order for us  to release information regarding their care.   Consent: Patient/Guardian gives verbal consent for treatment and assignment of benefits for services provided during this visit. Patient/Guardian expressed understanding and agreed to proceed.   Diagnosis: Bipolar II disorder (HCC) [F31.81]    1. Bipolar II disorder (HCC)       Randall Bastos, LCSW

## 2024-01-26 ENCOUNTER — Other Ambulatory Visit (HOSPITAL_BASED_OUTPATIENT_CLINIC_OR_DEPARTMENT_OTHER): Payer: Self-pay

## 2024-02-01 ENCOUNTER — Ambulatory Visit (INDEPENDENT_AMBULATORY_CARE_PROVIDER_SITE_OTHER)

## 2024-02-01 ENCOUNTER — Encounter (HOSPITAL_COMMUNITY): Payer: Self-pay

## 2024-02-01 DIAGNOSIS — F411 Generalized anxiety disorder: Secondary | ICD-10-CM

## 2024-02-01 DIAGNOSIS — F3181 Bipolar II disorder: Secondary | ICD-10-CM | POA: Diagnosis not present

## 2024-02-01 DIAGNOSIS — F4310 Post-traumatic stress disorder, unspecified: Secondary | ICD-10-CM

## 2024-02-01 NOTE — Progress Notes (Signed)
 THERAPIST PROGRESS NOTE  Session Time: 10:04 am - 11:00 am  Participation Level: Active  Behavioral Response: Neat Alert Euthymic  Type of Therapy: Individual Therapy  Treatment Goals addressed:   LTG: Todd Tucker will stabilize mood and increase goal-directed behavior as measured by using a daily log to record fluctuations. (BH CCP BIPOLAR DISORDER-MANIA/HYPOMANIA) STG: Todd Tucker will identify cognitive patterns and beliefs that interfere with therapy (BH CCP BIPOLAR DISORDER-MANIA/HYPOMANIA) STG: Todd Tucker will attend at least 80% of scheduled follow-up medication management appointments (BH CCP BIPOLAR DISORDER-MANIA/HYPOMANIA) STG: Todd Tucker will complete at least 80% of assigned homework (BH CCP BIPOLAR DISORDER-MANIA/HYPOMANIA)  ProgressTowards Goals: Initial  Interventions: CBT, Motivational Interviewing, and Supportive  Summary: Todd Tucker is a 43 y.o. adult male who is currently separated from his wife of 10 years. He did his initial intake with a different provider who is changing her schedule due to a promotion. He was seen today to establish treatment goals. Chart reports hx of Bipolar II, PTSD, GAD, excoration, cluster C traits and OCD.  Todd Tucker shares he has had mental health concerns for most of my life. Currently reports feeling stable but is seeking long term therapy to rebuild his life. Todd Tucker was arrested in May 2025 of Taking Indecent Liberties with Student and Contributing to the Delinquency of a Minor. He reported that the charges have been dismissed because they didn't happen, they had no proof because it didn't happen. Note: on the [place] judicial website indicates the first charge was dismissed because the available evidence is not sufficient to show that Defendant willfully took or attempted to take immoral, improper, or indecent liberties with a student for purpose of arousing or gratifying sexual desire. The second charge is still pending.   Todd Tucker reports his life is a mess. He  is currently living back home with his parents and does not have a car or a job. He has a hx of suicidal thoughts, no hx of attempts. Todd Tucker has expressed the desire to rebuild his life but to process the trauma of his recent legal issues and how he viewed himself prior to his arrest.  Suicidal/Homicidal: No without intent/plan  Therapist Response: Todd Tucker rep[orts he wants to focus on rebuilding his life after a significant period of struggle marked by mental health challenges and recent legal issues. Having a hx of Bipolar II, PTSD, GAD, excoriation, and cluster C traits, he has expressed a desire for long-term therapy to create stability in his life. There is a need to assess his current coping mechanisms, and identify support systems, particularly given his current living situation with his parents. CBT will be utilized to help Todd Tucker work through negative self-talk, supporting him in reframing these thoughts to foster a more positive self-image. We will also focus on mood regulation strategies to manage his emotional responses effectively and address issues related to impulsivity. Together we will set realistic and achievable goals aimed at enhancing his coping strategies, addressing his mental health symptoms, and working towards greater independence and well-being.  Plan: Return again in 2 weeks.  Diagnosis:   Bipolar II disorder (HCC)  Generalized anxiety disorder  PTSD (post-traumatic stress disorder)  Collaboration of Care: Other None  Patient/Guardian was advised Release of Information must be obtained prior to any record release in order to collaborate their care with an outside provider. Patient/Guardian was advised if they have not already done so to contact the registration department to sign all necessary forms in order for us  to release information regarding their care.  Consent: Patient/Guardian gives verbal consent for treatment and assignment of benefits for services provided  during this visit. Patient/Guardian expressed understanding and agreed to proceed.   Dorotea Hand L. Robynn, LCSW  02/01/2024

## 2024-02-06 ENCOUNTER — Other Ambulatory Visit (HOSPITAL_BASED_OUTPATIENT_CLINIC_OR_DEPARTMENT_OTHER): Payer: Self-pay

## 2024-02-06 ENCOUNTER — Ambulatory Visit (HOSPITAL_COMMUNITY): Admitting: Physician Assistant

## 2024-02-07 ENCOUNTER — Other Ambulatory Visit (HOSPITAL_BASED_OUTPATIENT_CLINIC_OR_DEPARTMENT_OTHER): Payer: Self-pay

## 2024-02-20 ENCOUNTER — Ambulatory Visit (INDEPENDENT_AMBULATORY_CARE_PROVIDER_SITE_OTHER)

## 2024-02-20 DIAGNOSIS — F3181 Bipolar II disorder: Secondary | ICD-10-CM

## 2024-02-20 DIAGNOSIS — F4310 Post-traumatic stress disorder, unspecified: Secondary | ICD-10-CM

## 2024-02-20 DIAGNOSIS — F411 Generalized anxiety disorder: Secondary | ICD-10-CM

## 2024-02-20 NOTE — Progress Notes (Signed)
 "  THERAPIST PROGRESS NOTE  Session Time: 8:01am - 9:00am  Participation Level: Active  Behavioral Response: Neat Alert Euthymic  Type of Therapy: Individual Therapy  Treatment Goals addressed:   Todd Tucker will stabilize mood and increase goal-directed behavior as measured by using a daily log to record fluctuations. (BH CCP BIPOLAR DISORDER-MANIA/HYPOMANIA) STG: Todd Tucker will identify cognitive patterns and beliefs that interfere with therapy (BH CCP BIPOLAR DISORDER-MANIA/HYPOMANIA) STG: Todd Tucker will attend at least 80% of scheduled follow-up medication management appointments (BH CCP BIPOLAR DISORDER-MANIA/HYPOMANIA) STG: Todd Tucker will complete at least 80% of assigned homework (BH CCP BIPOLAR DISORDER-MANIA/HYPOMANIA)  ProgressTowards Goals: Progressing  Interventions: CBT, Motivational Interviewing, and Supportive  Summary: Todd Tucker is a 43 y.o. adult who presents with a history of bipolar disorder, PTSD, and generalized anxiety disorder. He reports experiencing feelings of anxiety and boredom, expressing a sense of struggle related to his unemployment, stating he feels like a loser. 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 During today's session, Todd Tucker and the therapist focused on processing his childhood experiences, which have contributed to his self-esteem issues. He articulated a lack of positive self-regard, indicating he does not recall any time when he felt good about himself and consistently speaks negatively about his self-worth. Todd Tucker expressed feelings of injustice, asserting that the girl ruined my life, and struggles to acknowledge his own actions and behaviors concerning the situation with his student. The session also touched on his marriage, where he observed a lack of equality in support from his ex-wife, compounded by prior knowledge of  being unable to have children. Todd Tucker was alert and oriented times five, reported no suicidal ideations, had no plan, and was actively engaged in the therapeutic process..   Suicidal/Homicidal: No without intent/plan  Therapist Response: The therapist utilized cognitive behavioral therapy techniques to assist Todd Tucker in recognizing and reframing his negative thoughts. A significant focus was placed on challenging Damons lack of acknowledgment regarding his active participation in crossing boundaries with his student. Todd Tucker displayed a tendency to externalize responsibility, often blaming the student for the consequences of his actions. This refusal to accept accountability for his behavior creates a barrier to his capacity for self-reflection and personal growth.To address this issue, the therapist employed motivational interviewing and strengths-based therapy, encouraging Todd Tucker to explore his feelings of injustice while guiding him to understand the importance of recognizing his role in the situation. This approach aimed to help him see that accepting responsibility for his actions is crucial for his healing process. As part of the therapeutic work, the therapist assigned homework, encouraging Todd Tucker to start journaling whenever he recognizes instances of negative self-talk. Todd Tucker was engaged and expressed a willingness to complete this assignment, indicating a positive response to the therapeutic interventions and a readiness to begin confronting his accountability.  Plan: Return again in 2 weeks.  Diagnosis:   PTSD (post-traumatic stress disorder)  Bipolar II disorder (HCC)  Generalized anxiety disorder  Collaboration of Care: Other None  Patient/Guardian was advised Release of Information must be obtained prior to any record release in order to collaborate their care with an outside provider. Patient/Guardian was advised if they have not already done so to contact the registration department to  sign all necessary forms in order for us  to release information regarding their care.   Consent: Patient/Guardian gives verbal consent for treatment and assignment of benefits for services provided during this visit.  Patient/Guardian expressed understanding and agreed to proceed.   Nayara Taplin L. Maddon Horton, LCSW   "

## 2024-02-23 ENCOUNTER — Other Ambulatory Visit (HOSPITAL_BASED_OUTPATIENT_CLINIC_OR_DEPARTMENT_OTHER): Payer: Self-pay

## 2024-02-23 MED ORDER — FLUZONE 0.5 ML IM SUSY
0.5000 mL | PREFILLED_SYRINGE | Freq: Once | INTRAMUSCULAR | 0 refills | Status: AC
  Filled 2024-02-23: qty 0.5, 1d supply, fill #0

## 2024-02-26 NOTE — Progress Notes (Signed)
 " Psychiatric Follow up  Patient Identification: Todd Tucker MRN:  [id number] Date of Evaluation:  03/08/2024 Referral Source: PHP Chief Complaint:   Chief Complaint  Patient presents with   Follow-up   Medication Refill   Visit Diagnosis:    ICD-10-CM   1. Major depressive disorder, recurrent episode, moderate (HCC)  F33.1 doxepin  (SINEQUAN ) 50 MG capsule    venlafaxine  XR (EFFEXOR -XR) 150 MG 24 hr capsule    2. Bipolar II disorder (HCC)  F31.81 lamoTRIgine  (LAMICTAL ) 200 MG tablet    3. Generalized anxiety disorder  F41.1 hydrOXYzine  (ATARAX ) 25 MG tablet    4. Does not have primary care provider  Z75.8 Ambulatory referral to [facility]    Ambulatory referral to Internal Medicine        Assessment:  Todd Tucker is a 44 y.o. adult with a history of bipolar 2 disorder, PTSD, MDD, GAD, cluster C personality traits who presents in person to [facility] at [facility] for follow up on 03/08/2024.   He has improved significantly from his initial visit, his depression and anxiety symptoms have improved.  Though he continues to have psychosocial stressors that include living with his parents, currently unemployed but he has been trying to cope with them well.  He has been exercising every day, is going for group meetings and has continued psychotherapy in the clinic with good therapeutic relationship.  He has been compliant with all his medications, has had no side effects.  He inquired about weight loss medications, referral was placed today for a primary care provider set up.  He has been eating and sleeping well and is not actively or passively homicidal or suicidal.  There are no safety concerns today.  He does not use any substances including alcohol or cigarettes.  His coping skills have improved from previous visit, utilize motivational counseling today.  Encouraged him to keep up with his therapy appointments.  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  Due to significant benefit on the current dose of medications, plan to continue same medication management.  Will have him back in the clinic in 12 weeks.  Risk Assessment: A suicide and violence risk assessment was performed as part of this evaluation. There patient is deemed to be at chronic elevated risk for self-harm/suicide given the following factors: sense of isolation. These risk factors are mitigated by the following factors: lack of active SI/HI, no known access to weapons or firearms, no history of previous suicide attempts, no history of violence, and motivation for treatment. The patient is deemed to be at chronic elevated risk for violence given the following factors: N/A. These risk factors are mitigated by the following factors: no known history of violence towards others, no known violence towards others in the last 6 months, no known history of threats of harm towards others, no known homicidal ideation in the last 6 months, no command hallucinations to harm others in the last 6 months, no active symptoms of psychosis, and no active symptoms of mania. There is no acute risk for suicide or violence at this time. The patient was educated about relevant modifiable risk factors including following recommendations for treatment of psychiatric illness and abstaining from substance abuse.  While future psychiatric events cannot be accurately predicted, the patient does not currently require  acute inpatient psychiatric care and does not currently meet Advance  involuntary commitment criteria.  Patient was given contact information for crisis resources,  behavioral health clinic and was instructed to call 911 for emergencies.    Plan: # MDD without psychotic features, moderate # Bipolar 2 disorder Past medication trials: Zoloft  Status of problem: Improving Interventions: -- Continue Effexor  150 mg in the morning with breakfast --  Continue doxepin  50 mg nightly for sleep -- Continue Lamictal  200 mg daily for mood stabilization  # GAD Past medication trials: Zoloft  Status of problem: Improving Interventions: -- Continue hydroxyzine  25 mg 3 times daily as needed for heightened anxiety -- Encouraged healthy coping skills -- Continue therapy in the clinic  # Excoriation disorder Past medication trials:  Status of problem: Current Interventions: -- Continue therapy in the clinic -- May consider memantine in the future, patient not amenable currently for any medication   History of Present Illness:   Todd Tucker is a 44 year old male patient with a history of bipolar 2 disorder, PTSD, MDD, GAD, skin picking disorder that presented to the clinic today for follow-up.  He was hospitalized in October 2025 for bipolar disorder, suicidal ideation.  Was at [facility], got discharged and was recommended to follow-up as outpatient for psychiatry. Today, he reported his mood as pretty good .  He denied any active or passive SI/HI/AVH.  He reported good sleep and good appetite.  When asked about depression he stated good , and anxiety good .  When asked about the reason he stated counseling is helping, group therapy is helping .  Reported that on some days he feels depressed but he has been trying to cope well with that, exercises every day.  He is doing group therapy with [facility]. Reported that hopelessness, feeling sad and his energy levels have improved since last visit.  He reported being compliant on all the medications, denied any side effects or any new physical concerns.  He denied any nightmares or flashbacks.  Denied any symptoms of psychosis or mania.  Reported that he is living with his parents, currently waiting to get a check for buying a car.  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 He denied using any substances  including alcohol or cigarettes.  He reported that he does not have a primary care provider and is looking to get back on weight loss medications, referral was sent today per his request. Discussed about continuing the same medications, patient amenable to the plan.  Will have him back in the clinic in 12 weeks.  Associated Signs/Symptoms: Depression Symptoms:  depressed mood, anhedonia, fatigue, feelings of worthlessness/guilt, hopelessness, (Hypo) Manic Symptoms:  None Anxiety Symptoms:  Excessive Worry, Psychotic Symptoms:  None PTSD Symptoms: Negative  Past Psychiatric History:  Past psychiatric diagnoses: Bipolar 2 disorder, PTSD, MDD, GAD, cluster C personality Psychiatric hospitalizations: Once in October 2025 for bipolar disorder Past suicide attempts: Denies Hx of self harm: Denies Hx of violence towards others: Denies Prior psychiatric providers: None Prior therapy: None Access to firearms: Denies  Prior medication trials: Lamotrigine , Effexor , hydroxyzine , doxepin   Substance use: Denies  Past Medical History:  Past Medical History:  Diagnosis Date   Anxiety    Depression    OCD (obsessive compulsive disorder)    Sleep apnea     Past Surgical History:  Procedure Laterality Date   KNEE SURGERY Bilateral    KNEE SURGERY      Family Psychiatric History: NS  Family History:  Family History  Problem Relation Age of Onset  Anxiety disorder Mother    CVA Father    ADD / ADHD Sister     Social History:   Social History   Socioeconomic History   Marital status: Legally Separated    Spouse name: Not on file   Number of children: 0   Years of education: Not on file   Highest education level: Master's degree (e.g., MA, MS, MEng, MEd, MSW, MBA)  Occupational History   Not on file  Tobacco Use   Smoking status: Never   Smokeless tobacco: Never  Vaping Use   Vaping status: Never Used  Substance and Sexual Activity   Alcohol use: Yes    Comment:  rarely   Drug use: No   Sexual activity: Not on file  Other Topics Concern   Not on file  Social History Narrative   Not on file   Social Drivers of Health   Tobacco Use: Low Risk (01/21/2024)   Patient History    Smoking Tobacco Use: Never    Smokeless Tobacco Use: Never    Passive Exposure: Not on file  Financial Resource Strain: Low Risk (01/11/2024)   Overall Financial Resource Strain (CARDIA)    Difficulty of Paying Living Expenses: Not very hard  Food Insecurity: No Food Insecurity (12/04/2023)   Epic    Worried About Programme Researcher, Broadcasting/film/video in the Last Year: Never true    Ran Out of Food in the Last Year: Never true  Transportation Needs: No Transportation Needs (12/04/2023)   Epic    Lack of Transportation (Medical): No    Lack of Transportation (Non-Medical): No  Physical Activity: Sufficiently Active (01/11/2024)   Exercise Vital Sign    Days of Exercise per Week: 7 days    Minutes of Exercise per Session: 90 min  Stress: Stress Concern Present (01/11/2024)   [employer] of Occupational Health - Occupational Stress Questionnaire    Feeling of Stress: Very much  Social Connections: Moderately Isolated (01/11/2024)   Social Connection and Isolation Panel    Frequency of Communication with Friends and Family: More than three times a week    Frequency of Social Gatherings with Friends and Family: Never    Attends Religious Services: More than 4 times per year    Active Member of Clubs or Organizations: No    Attends Banker Meetings: Never    Marital Status: Separated  Depression (PHQ2-9): High Risk (01/16/2024)   Depression (PHQ2-9)    PHQ-2 Score: 19  Alcohol Screen: Low Risk (12/04/2023)   Alcohol Screen    Last Alcohol Screening Score (AUDIT): 0  Housing: Low Risk (12/04/2023)   Epic    Unable to Pay for Housing in the Last Year: No    Number of Times Moved in the Last Year: 0    Homeless in the Last Year: No  Utilities: Not At Risk  (12/04/2023)   Epic    Threatened with loss of utilities: No  Health Literacy: Adequate Health Literacy (01/11/2024)   B1300 Health Literacy    Frequency of need for help with medical instructions: Never    Additional Social History: Patient used to work as a psychologist, occupational and a school, currently unemployed.  He is originally from [place] , lived in [place]  before moving to [place] .  Allergies:  No Known Allergies  Metabolic Disorder Labs: Lab Results  Component Value Date   HGBA1C 4.5 (L) 12/04/2023   MPG 82.45 12/04/2023   Lab Results  Component Value Date  PROLACTIN 8.9 12/04/2023   Lab Results  Component Value Date   CHOL 178 12/04/2023   TRIG 169 (H) 12/04/2023   HDL 33 (L) 12/04/2023   CHOLHDL 5.4 12/04/2023   VLDL 34 12/04/2023   LDLCALC 111 (H) 12/04/2023   Lab Results  Component Value Date   TSH 1.865 12/04/2023    Therapeutic Level Labs: No results found for: LITHIUM No results found for: CBMZ No results found for: VALPROATE  Current Medications: Current Outpatient Medications  Medication Sig Dispense Refill   cyanocobalamin  1000 MCG tablet Take 1 tablet (1,000 mcg total) by mouth daily. 100 tablet 0   doxepin  (SINEQUAN ) 50 MG capsule Take 1 capsule (50 mg total) by mouth at bedtime. 90 capsule 0   hydrOXYzine  (ATARAX ) 25 MG tablet Take 1 tablet (25 mg total) by mouth 3 (three) times daily as needed for anxiety and for sleep. 45 tablet 2   lamoTRIgine  (LAMICTAL ) 200 MG tablet Take 1 tablet (200 mg total) by mouth daily. 90 tablet 0   venlafaxine  XR (EFFEXOR -XR) 150 MG 24 hr capsule Take 1 capsule (150 mg total) by mouth daily with breakfast. 90 capsule 0   No current facility-administered medications for this visit.    Musculoskeletal: Strength & Muscle Tone: within normal limits Gait & Station: normal Patient leans: N/A  Psychiatric Specialty Exam:  Psychiatric Specialty Exam: Blood pressure 139/64, pulse 72, height 5' 7 (1.702 m),  weight 278 lb (126.1 kg).Body mass index is 43.54 kg/m. Review of Systems  General Appearance: Casual and Fairly Groomed  Eye Contact:  Good  Speech:  Clear and Coherent and Normal Rate  Volume:  Normal  Mood:  Euthymic  Affect:  Appropriate  Thought Content: Logical   Suicidal Thoughts:  No  Homicidal Thoughts:  No  Thought Process:  Coherent  Orientation:  Full (Time, Place, and Person)    Memory: Immediate;   Good Recent;   Good Remote;   Good  Judgment:  Fair  Insight:  Fair  Concentration:  Concentration: Good and Attention Span: Good  Recall:  not formally assessed   Fund of Knowledge: Good  Language: Good  Psychomotor Activity:  Normal  Akathisia:  No  AIMS (if indicated): not done  Assets:  Communication Skills Desire for Improvement Leisure Time Physical Health Transportation  ADL's:  Intact  Cognition: WNL  Sleep:  Fair    Screenings: AUDIT    Flowsheet Row [facility]  Alcohol Use Disorder Identification Test Final Score (AUDIT) 0   GAD-7    Flowsheet Row Office Visit from 01/16/2024 in [facility] Counselor from 01/11/2024 in [facility] Counselor from 01/01/2024 in [facility] Counselor from 12/25/2023 in [facility] Counselor from 12/18/2023 in [facility]  Total GAD-7 Score 18 15 10 16 21    PHQ2-9    Flowsheet Row Office Visit from 01/16/2024 in [facility] Counselor from 01/11/2024 in [facility] Counselor from 01/01/2024 in [facility] Counselor from 12/25/2023 in [facility] Counselor from 12/19/2023 in [facility]  PHQ-2 Total Score 5 4 2 3 4   PHQ-9 Total Score 19 16 11 15 18    Flowsheet  Row Office Visit from 01/16/2024 in [facility] Counselor from 01/11/2024 in [facility] Counselor from 12/19/2023 in  [facility]  C-SSRS RISK CATEGORY Error: Q3, 4, or 5 should not be populated when Q2 is No No Risk Error: Q3, 4, or 5 should not be populated when Q2 is No     Collaboration of Care: Medication Management AEB Dr. Jean, PHP notes, ED notes  Patient/Guardian was advised Release of Information must be obtained prior to any record release in order to collaborate their care with an outside provider. Patient/Guardian was advised if they have not already done so to contact the registration department to sign all necessary forms in order for us  to release information regarding their care.   Consent: Patient/Guardian gives verbal consent for treatment and assignment of benefits for services provided during this visit. Patient/Guardian expressed understanding and agreed to proceed.   Monroe Qin, MD 1/16/20269:15 AM "

## 2024-02-29 ENCOUNTER — Encounter (HOSPITAL_COMMUNITY)

## 2024-02-29 ENCOUNTER — Ambulatory Visit (INDEPENDENT_AMBULATORY_CARE_PROVIDER_SITE_OTHER): Payer: MEDICAID

## 2024-02-29 DIAGNOSIS — F4310 Post-traumatic stress disorder, unspecified: Secondary | ICD-10-CM | POA: Diagnosis not present

## 2024-02-29 DIAGNOSIS — F331 Major depressive disorder, recurrent, moderate: Secondary | ICD-10-CM

## 2024-02-29 DIAGNOSIS — F411 Generalized anxiety disorder: Secondary | ICD-10-CM

## 2024-02-29 DIAGNOSIS — F3181 Bipolar II disorder: Secondary | ICD-10-CM

## 2024-02-29 NOTE — Progress Notes (Signed)
 "  THERAPIST PROGRESS NOTE  Session Time: 8:02am - 9:00am  Participation Level: Active  Behavioral Response: Neat Alert Euthymic  Type of Therapy: Individual Therapy  Treatment Goals addressed:  Todd Tucker will stabilize mood and increase goal-directed behavior as measured by using a daily log to record fluctuations. (BH CCP BIPOLAR DISORDER-MANIA/HYPOMANIA) STG: Todd Tucker will identify cognitive patterns and beliefs that interfere with therapy (BH CCP BIPOLAR DISORDER-MANIA/HYPOMANIA) STG: Todd Tucker will attend at least 80% of scheduled follow-up medication management appointments (BH CCP BIPOLAR DISORDER-MANIA/HYPOMANIA) STG: Todd Tucker will complete at least 80% of assigned homework (BH CCP BIPOLAR DISORDER-MANIA/HYPOMANIA)  ProgressTowards Goals: Progressing  Interventions: CBT, Motivational Interviewing, and Supportive  Summary: Todd Tucker is a 44 y.o. adult who presented to the session in a positive mood, expressing that his week and New Year were enjoyable. The sessions primary focus was to explore Todd Tucker early childhood experiences, aiming to identify specific instances that contributed to his low self-esteem. The therapist guided Todd Tucker through memories and reflections, encouraging him to articulate feelings and thoughts associated with those early experiences. As they processed these memories, emphasis was placed on recognizing patterns of negative self-talk that have persisted into adulthood. The therapist introduced cognitive restructuring techniques to help Todd Tucker challenge these patterns and reframe his thoughts in a more positive light. In particular, they discussed scenarios from his marriage where Todd Tucker accepted less than he deserved, highlighting the connections between his childhood experiences and his current relationship dynamics. The conversation also touched on Todd Tucker struggles with unemployment, a topic that has been a source of distress for him. They worked on breaking down the negative  thought patterns related to his joblessness, exploring feelings of inadequacy and shame, and how these feelings have impacted his self-perception and motivation. The therapist encouraged Todd Tucker to recognize his worth beyond employment status and to cultivate a more balanced self-image.To facilitate this exploration, they employed a behavioral chain analysis worksheet. This tool allowed Todd Tucker to visualize the triggers, thoughts, feelings, and consequences related to specific behaviors and situations. By mapping these elements, Todd Tucker was able to gain insight into how his negative thoughts influence his actions and emotional responses.Throughout the session, Todd Tucker exhibited a high level of engagement and responsiveness, demonstrating alertness and willingness to participate in the therapeutic process. He denied thoughts of SI or HI, indicating a stable emotional state.   Suicidal/Homicidal: No without intent/plan  Therapist Response: The therapist engaged Todd Tucker in a thoughtful exploration of his early childhood experiences, guiding him to articulate the origins of his low self-esteem. Utilizing a compassionate and supportive approach, the therapist facilitated discussions that allowed Todd Tucker to reflect on how these early experiences have shaped his self-perception and current relationships. They employed cognitive restructuring techniques to challenge negative thought patterns, helping Todd Tucker to reframe his self-narrative and recognize the value he deserves in his relationships, particularly within his marriage. The therapist also addressed Todd Tucker feelings regarding unemployment, encouraging him to dismantle associated negative beliefs and fostering a more balanced view of his self-worth. To deepen the understanding of these issues, the therapist introduced a behavioral chain analysis worksheet, allowing Todd Tucker to visualize the connections between his triggers, thoughts, feelings, and behaviors. Throughout the  session, the therapist maintained an encouraging atmosphere, ensuring that Todd Tucker remained engaged and focused.  Plan: Return again in 1 weeks.  Diagnosis:  PTSD (post-traumatic stress disorder)  Bipolar II disorder (HCC)  Generalized anxiety disorder  Major depressive disorder, recurrent episode, moderate (HCC)  Collaboration of Care: Other None  Patient/Guardian was advised Release of Information must  be obtained prior to any record release in order to collaborate their care with an outside provider. Patient/Guardian was advised if they have not already done so to contact the registration department to sign all necessary forms in order for us  to release information regarding their care.   Consent: Patient/Guardian gives verbal consent for treatment and assignment of benefits for services provided during this visit. Patient/Guardian expressed understanding and agreed to proceed.   Vanisha Whiten L. Robynn, LCSW  02/29/2024  "

## 2024-03-07 ENCOUNTER — Ambulatory Visit (HOSPITAL_COMMUNITY): Payer: MEDICAID

## 2024-03-07 ENCOUNTER — Encounter (HOSPITAL_COMMUNITY)

## 2024-03-07 DIAGNOSIS — F4310 Post-traumatic stress disorder, unspecified: Secondary | ICD-10-CM | POA: Diagnosis not present

## 2024-03-07 DIAGNOSIS — F3181 Bipolar II disorder: Secondary | ICD-10-CM

## 2024-03-07 DIAGNOSIS — F411 Generalized anxiety disorder: Secondary | ICD-10-CM

## 2024-03-07 NOTE — Progress Notes (Unsigned)
" ° °  THERAPIST PROGRESS NOTE  Session Time: 11:00am - 12:05pm  Participation Level: Active  Behavioral Response: Neat Alert Euthymic  Type of Therapy: Individual Therapy  Treatment Goals addressed:   Blaise will stabilize mood and increase goal-directed behavior as measured by using a daily log to record fluctuations. (BH CCP BIPOLAR DISORDER-MANIA/HYPOMANIA) STG: Iyad will identify cognitive patterns and beliefs that interfere with therapy (BH CCP BIPOLAR DISORDER-MANIA/HYPOMANIA) STG: Ormand will attend at least 80% of scheduled follow-up medication management appointments (BH CCP BIPOLAR DISORDER-MANIA/HYPOMANIA) STG: Kohl will complete at least 80% of assigned homework (BH CCP BIPOLAR DISORDER-MANIA/HYPOMANIA)    ProgressTowards Goals: Initial  Interventions: CBT, Motivational Interviewing, and Strength-based  Summary: Merland Quinney is a 44 y.o. adult who presents with PTSD, Bipolar II disorder, and Generalized Anxiety Disorder (GAD). 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 Utilizing his coping skills, he successfully navigated his feelings of anxiety by distinguishing between fact and fiction, enabling him to attend the event. During the session, the therapist praised Olden for his progress and they processed some of his childhood trauma, beginning to deconstruct negative thoughts he has harbored for decades. The therapist assigned him a Socratic questioning worksheet for homework, which he agreed to complete. Cloyde denies any suicidal or homicidal thoughts and was actively engaged throughout the session..   Suicidal/Homicidal: No without intent/plan  Therapist Response: the therapist provided praise for Damons efforts in managing his anxiety and attending the football coaching summit  despite his initial fears. Utilizing Cognitive Behavioral Therapy (CBT) techniques, the therapist guided Abrian through his thought processes, helping him identify and challenge negative thoughts stemming from past trauma. The therapist employed Motivational Interviewing (MI) strategies, asking open-ended questions to encourage Nekhi to explore his feelings and reflect on his experiences. This approach facilitated a deeper understanding of the cognitive distortions affecting his self-perception. By creating a supportive environment, the therapist empowered Mihir to articulate his thoughts and feelings, which allowed for meaningful processing and deconstruction of long-held negative beliefs. Overall, the therapist's responses were instrumental in fostering a safe space for Haneef to engage in self-exploration and growth.  Plan: Return again in 1 weeks.  Diagnosis:   PTSD (post-traumatic stress disorder)  Bipolar II disorder (HCC)  Generalized anxiety disorder  Collaboration of Care: Other None  Patient/Guardian was advised Release of Information must be obtained prior to any record release in order to collaborate their care with an outside provider. Patient/Guardian was advised if they have not already done so to contact the registration department to sign all necessary forms in order for us  to release information regarding their care.   Consent: Patient/Guardian gives verbal consent for treatment and assignment of benefits for services provided during this visit. Patient/Guardian expressed understanding and agreed to proceed.   Rian Koon L. Robynn, LCSW  03/07/2024  "

## 2024-03-08 ENCOUNTER — Other Ambulatory Visit: Payer: Self-pay

## 2024-03-08 ENCOUNTER — Ambulatory Visit (INDEPENDENT_AMBULATORY_CARE_PROVIDER_SITE_OTHER): Payer: MEDICAID

## 2024-03-08 ENCOUNTER — Other Ambulatory Visit (HOSPITAL_BASED_OUTPATIENT_CLINIC_OR_DEPARTMENT_OTHER): Payer: Self-pay

## 2024-03-08 VITALS — BP 139/64 | HR 72 | Ht 67.0 in | Wt 278.0 lb

## 2024-03-08 DIAGNOSIS — Z758 Other problems related to medical facilities and other health care: Secondary | ICD-10-CM

## 2024-03-08 DIAGNOSIS — F331 Major depressive disorder, recurrent, moderate: Secondary | ICD-10-CM | POA: Diagnosis not present

## 2024-03-08 DIAGNOSIS — F411 Generalized anxiety disorder: Secondary | ICD-10-CM

## 2024-03-08 DIAGNOSIS — F3181 Bipolar II disorder: Secondary | ICD-10-CM

## 2024-03-08 MED ORDER — LAMOTRIGINE 200 MG PO TABS
200.0000 mg | ORAL_TABLET | Freq: Every day | ORAL | 0 refills | Status: AC
  Filled 2024-03-08: qty 90, 90d supply, fill #0

## 2024-03-08 MED ORDER — HYDROXYZINE HCL 25 MG PO TABS
25.0000 mg | ORAL_TABLET | Freq: Three times a day (TID) | ORAL | 2 refills | Status: AC | PRN
  Filled 2024-03-08: qty 45, 15d supply, fill #0

## 2024-03-08 MED ORDER — VENLAFAXINE HCL ER 150 MG PO CP24
150.0000 mg | ORAL_CAPSULE | Freq: Every day | ORAL | 0 refills | Status: AC
  Filled 2024-03-08: qty 90, 90d supply, fill #0

## 2024-03-08 MED ORDER — DOXEPIN HCL 50 MG PO CAPS
50.0000 mg | ORAL_CAPSULE | Freq: Every day | ORAL | 0 refills | Status: AC
  Filled 2024-03-08: qty 90, 90d supply, fill #0

## 2024-03-13 ENCOUNTER — Ambulatory Visit (HOSPITAL_COMMUNITY): Admitting: Mental Health

## 2024-03-14 ENCOUNTER — Ambulatory Visit (HOSPITAL_COMMUNITY): Payer: MEDICAID

## 2024-03-19 ENCOUNTER — Ambulatory Visit (HOSPITAL_COMMUNITY): Payer: MEDICAID

## 2024-03-21 ENCOUNTER — Ambulatory Visit (INDEPENDENT_AMBULATORY_CARE_PROVIDER_SITE_OTHER): Payer: MEDICAID

## 2024-03-21 DIAGNOSIS — F4310 Post-traumatic stress disorder, unspecified: Secondary | ICD-10-CM

## 2024-03-21 DIAGNOSIS — F331 Major depressive disorder, recurrent, moderate: Secondary | ICD-10-CM | POA: Diagnosis not present

## 2024-03-21 DIAGNOSIS — F411 Generalized anxiety disorder: Secondary | ICD-10-CM

## 2024-03-21 DIAGNOSIS — F3181 Bipolar II disorder: Secondary | ICD-10-CM

## 2024-03-21 NOTE — Progress Notes (Signed)
" ° °  THERAPIST PROGRESS NOTE  Session Time: 8:59am - 10:00am  Participation Level: Active  Behavioral Response: Neat Alert Euthymic  Type of Therapy: Individual Therapy  Treatment Goals addressed:   LTG: Todd Tucker will stabilize mood and increase goal-directed behavior as measured by using a daily log to record fluctuations. (BH CCP BIPOLAR DISORDER-MANIA/HYPOMANIA) STG: Todd Tucker will identify cognitive patterns and beliefs that interfere with therapy ([facility] C[facility] BIPOLAR DISORDER-MANIA/HYPOMANIA) STG: Todd Tucker will attend at least 80% of scheduled follow-up medication management appointments ([facility] C[facility] BIPOLAR DISORDER-MANIA/HYPOMANIA) STG: Todd Tucker will complete at least 80% of assigned homework ([facility] CCP BIPOLAR DISORDER-MANIA/HYPOMANIA)  ProgressTowards Goals: Initial  Interventions: CBT, Motivational Interviewing, Strength-based, and Supportive  Summary: Todd Tucker is a 44 y.o. adult who presents with as a walk-in client today due to his therapist being out sick. He reported feeling lost without therapy for the past two weeks. During the session, the therapist assisted Todd Tucker in processing his emotions and praised him for continuing to utilize his coping skills while adhering to his medication regimen. Todd Tucker expressed experiencing PTSD symptoms and flashbacks related to his incarceration, which he connected to the weather that kept him at home this past week. The therapist employed cognitive-behavioral therapy (CBT) techniques to address the negative thought patterns Todd Tucker had about himself stemming from his incarceration. They explored the events leading to his arrest and discussed the underlying issues affecting his self-esteem. Todd Tucker was engaged and pleasant throughout the session, reporting no thoughts of suicidal or homicidal ideation..   Suicidal/Homicidal: No without intent/plan  Therapist Response: the therapist actively listened to Todd Tucker as he expressed his feelings of being lost without therapy over  the past two weeks. The therapist provided validation for Todd Tucker's experiences and emotions, recognizing the difficulty he faced during this time. To address Todd Tucker's reported PTSD symptoms and flashbacks related to his incarceration, the therapist employed cognitive-behavioral therapy (CBT) techniques, helping him to identify and reframe his negative thought patterns about himself.The therapist encouraged Todd Tucker by praising him for his ongoing use of coping skills and adherence to his medication regimen. They processed various events leading to his arrest and explored the roots of his self-esteem issues. Throughout the session, the therapist maintained a supportive and encouraging demeanor, fostering an environment where Todd Tucker felt comfortable and engaged in the therapeutic process. Todd Tucker reports no thoughts of suicidal or homicidal ideation.  Plan: Return again in 1 weeks.  Diagnosis:   Major depressive disorder, recurrent episode, moderate (HCC)  Bipolar II disorder (HCC)  Generalized anxiety disorder  PTSD (post-traumatic stress disorder)  Collaboration of Care: Other None  Patient/Guardian was advised Release of Information must be obtained prior to any record release in order to collaborate their care with an outside provider. Patient/Guardian was advised if they have not already done so to contact the registration department to sign all necessary forms in order for us  to release information regarding their care.   Consent: Patient/Guardian gives verbal consent for treatment and assignment of benefits for services provided during this visit. Patient/Guardian expressed understanding and agreed to proceed.   Kirstina Leinweber L. Robynn, LCSW  03/21/2024  "

## 2024-03-26 ENCOUNTER — Ambulatory Visit (HOSPITAL_COMMUNITY): Payer: MEDICAID

## 2024-03-28 ENCOUNTER — Ambulatory Visit (HOSPITAL_COMMUNITY): Payer: MEDICAID

## 2024-03-28 DIAGNOSIS — F331 Major depressive disorder, recurrent, moderate: Secondary | ICD-10-CM

## 2024-03-28 DIAGNOSIS — F4310 Post-traumatic stress disorder, unspecified: Secondary | ICD-10-CM

## 2024-03-28 DIAGNOSIS — F3181 Bipolar II disorder: Secondary | ICD-10-CM

## 2024-03-28 DIAGNOSIS — F411 Generalized anxiety disorder: Secondary | ICD-10-CM

## 2024-03-28 NOTE — Progress Notes (Unsigned)
" ° °  THERAPIST PROGRESS NOTE  Session Time: 4:00pm - 5:01pm  Participation Level: Active  Behavioral Response: Neat Alert Euthymic  Type of Therapy: Individual Therapy  Treatment Goals addressed:   LTG: Todd Tucker will stabilize mood and increase goal-directed behavior as measured by using a daily log to record fluctuations. (BH CCP BIPOLAR DISORDER-MANIA/HYPOMANIA) STG: Todd Tucker will identify cognitive patterns and beliefs that interfere with therapy ([facility] CCP BIPOLAR DISORDER-MANIA/HYPOMANIA) STG: Todd Tucker will attend at least 80% of scheduled follow-up medication management appointments (BH CCP BIPOLAR DISORDER-MANIA/HYPOMANIA) STG: Todd Tucker will complete at least 80% of assigned homework (BH CCP BIPOLAR DISORDER-MANIA/HYPOMANIA)  ProgressTowards Goals: Progressing  Interventions: CBT, Motivational Interviewing, and Supportive  Summary: Todd Tucker is a 44 y.o. adult who presents with ***.   Suicidal/Homicidal: Nowithout intent/plan  Therapist Response: ***  Plan: Return again in 1 weeks.  Diagnosis:   Major depressive disorder, recurrent episode, moderate (HCC)  Bipolar II disorder (HCC)  Generalized anxiety disorder  PTSD (post-traumatic stress disorder)  Collaboration of Care: Other None  Patient/Guardian was advised Release of Information must be obtained prior to any record release in order to collaborate their care with an outside provider. Patient/Guardian was advised if they have not already done so to contact the registration department to sign all necessary forms in order for us  to release information regarding their care.   Consent: Patient/Guardian gives verbal consent for treatment and assignment of benefits for services provided during this visit. Patient/Guardian expressed understanding and agreed to proceed.   Suresh Audi L. Robynn, LCSW  03/28/2024  "

## 2024-04-02 ENCOUNTER — Ambulatory Visit (HOSPITAL_COMMUNITY): Payer: MEDICAID

## 2024-04-09 ENCOUNTER — Ambulatory Visit (HOSPITAL_COMMUNITY): Payer: MEDICAID

## 2024-04-16 ENCOUNTER — Ambulatory Visit (HOSPITAL_COMMUNITY): Payer: MEDICAID

## 2024-04-23 ENCOUNTER — Ambulatory Visit (HOSPITAL_COMMUNITY): Payer: MEDICAID

## 2024-04-30 ENCOUNTER — Ambulatory Visit (HOSPITAL_COMMUNITY): Payer: MEDICAID

## 2024-05-07 ENCOUNTER — Ambulatory Visit (HOSPITAL_COMMUNITY): Payer: MEDICAID

## 2024-05-14 ENCOUNTER — Ambulatory Visit (HOSPITAL_COMMUNITY): Payer: MEDICAID

## 2024-05-21 ENCOUNTER — Ambulatory Visit (HOSPITAL_COMMUNITY): Payer: MEDICAID

## 2024-05-31 ENCOUNTER — Encounter (HOSPITAL_COMMUNITY): Payer: MEDICAID
